# Patient Record
Sex: Female | Born: 1950
Health system: Southern US, Community
[De-identification: ages and names within clinical notes are randomized; demographics above are authoritative.]

## PROBLEM LIST (undated history)

## (undated) DIAGNOSIS — L729 Follicular cyst of the skin and subcutaneous tissue, unspecified: Secondary | ICD-10-CM

## (undated) DIAGNOSIS — E7800 Pure hypercholesterolemia, unspecified: Secondary | ICD-10-CM

## (undated) DIAGNOSIS — C801 Malignant (primary) neoplasm, unspecified: Secondary | ICD-10-CM

## (undated) DIAGNOSIS — I10 Essential (primary) hypertension: Secondary | ICD-10-CM

## (undated) DIAGNOSIS — R011 Cardiac murmur, unspecified: Secondary | ICD-10-CM

## (undated) HISTORY — PX: TUBAL LIGATION: SHX77

## (undated) HISTORY — DX: Malignant (primary) neoplasm, unspecified: C80.1

## (undated) HISTORY — PX: BREAST CYST ASPIRATION: SHX578

## (undated) HISTORY — DX: Cardiac murmur, unspecified: R01.1

## (undated) HISTORY — PX: TONSILLECTOMY AND ADENOIDECTOMY: SHX28

## (undated) HISTORY — PX: BREAST EXCISIONAL BIOPSY: SUR124

## (undated) HISTORY — DX: Follicular cyst of the skin and subcutaneous tissue, unspecified: L72.9

---

## 2004-11-15 ENCOUNTER — Ambulatory Visit: Payer: Self-pay | Admitting: Obstetrics and Gynecology

## 2005-04-12 ENCOUNTER — Ambulatory Visit: Payer: Self-pay | Admitting: Internal Medicine

## 2005-12-17 ENCOUNTER — Ambulatory Visit: Payer: Self-pay | Admitting: Obstetrics and Gynecology

## 2006-09-23 HISTORY — PX: ABDOMINAL HYSTERECTOMY: SHX81

## 2007-01-06 ENCOUNTER — Other Ambulatory Visit: Payer: Self-pay

## 2007-01-06 ENCOUNTER — Ambulatory Visit: Payer: Self-pay

## 2007-08-04 ENCOUNTER — Ambulatory Visit: Payer: Self-pay | Admitting: Gastroenterology

## 2008-01-11 ENCOUNTER — Ambulatory Visit: Payer: Self-pay | Admitting: Obstetrics and Gynecology

## 2008-09-23 LAB — HM COLONOSCOPY

## 2008-10-10 ENCOUNTER — Ambulatory Visit: Payer: Self-pay | Admitting: Gastroenterology

## 2009-01-16 ENCOUNTER — Ambulatory Visit: Payer: Self-pay | Admitting: Obstetrics and Gynecology

## 2010-01-18 ENCOUNTER — Ambulatory Visit: Payer: Self-pay | Admitting: Obstetrics and Gynecology

## 2011-01-21 ENCOUNTER — Ambulatory Visit: Payer: Self-pay | Admitting: Obstetrics and Gynecology

## 2012-01-22 ENCOUNTER — Ambulatory Visit: Payer: Self-pay | Admitting: Obstetrics and Gynecology

## 2012-12-14 LAB — BASIC METABOLIC PANEL
BUN: 21 mg/dL (ref 4–21)
Creatinine: 0.7 mg/dL (ref 0.5–1.1)
GLUCOSE: 94 mg/dL
Potassium: 4.3 mmol/L (ref 3.4–5.3)
SODIUM: 141 mmol/L (ref 137–147)

## 2012-12-14 LAB — LIPID PANEL
Cholesterol: 228 mg/dL — AB (ref 0–200)
HDL: 55 mg/dL (ref 35–70)
LDL CALC: 137 mg/dL
TRIGLYCERIDES: 179 mg/dL — AB (ref 40–160)

## 2013-01-25 ENCOUNTER — Ambulatory Visit: Payer: Self-pay | Admitting: Obstetrics and Gynecology

## 2013-01-25 LAB — HM MAMMOGRAPHY

## 2013-12-16 ENCOUNTER — Other Ambulatory Visit: Payer: Self-pay | Admitting: Obstetrics and Gynecology

## 2013-12-16 DIAGNOSIS — Z1371 Encounter for nonprocreative screening for genetic disease carrier status: Secondary | ICD-10-CM

## 2013-12-27 LAB — CBC AND DIFFERENTIAL: WBC: 4.8 10^3/mL

## 2013-12-28 ENCOUNTER — Ambulatory Visit: Payer: Self-pay

## 2014-01-26 ENCOUNTER — Ambulatory Visit: Payer: Self-pay | Admitting: Obstetrics and Gynecology

## 2014-01-31 ENCOUNTER — Ambulatory Visit
Admission: RE | Admit: 2014-01-31 | Discharge: 2014-01-31 | Disposition: A | Discharge status: Home / Self Care | Payer: Federal, State, Local not specified - PPO | Source: Ambulatory Visit | Attending: Obstetrics and Gynecology | Admitting: Obstetrics and Gynecology

## 2014-01-31 ENCOUNTER — Other Ambulatory Visit: Payer: Self-pay | Admitting: Obstetrics and Gynecology

## 2014-01-31 VITALS — BP 189/82 | HR 85

## 2014-01-31 DIAGNOSIS — T7840XA Allergy, unspecified, initial encounter: Secondary | ICD-10-CM

## 2014-01-31 DIAGNOSIS — Z1371 Encounter for nonprocreative screening for genetic disease carrier status: Secondary | ICD-10-CM

## 2014-01-31 DIAGNOSIS — T1590XA Foreign body on external eye, part unspecified, unspecified eye, initial encounter: Secondary | ICD-10-CM

## 2014-01-31 MED ORDER — DIPHENHYDRAMINE HCL 50 MG/ML IJ SOLN
50.0000 mg | Freq: Once | INTRAMUSCULAR | Status: AC
  Administered 2014-01-31: 50 mg via INTRAVENOUS

## 2014-01-31 MED ORDER — GADOBENATE DIMEGLUMINE 529 MG/ML IV SOLN
15.0000 mL | Freq: Once | INTRAVENOUS | Status: AC | PRN
  Administered 2014-01-31: 15 mL via INTRAVENOUS

## 2014-02-02 ENCOUNTER — Other Ambulatory Visit: Payer: Self-pay | Admitting: Obstetrics and Gynecology

## 2014-02-02 DIAGNOSIS — Z1371 Encounter for nonprocreative screening for genetic disease carrier status: Secondary | ICD-10-CM

## 2014-10-24 ENCOUNTER — Encounter: Payer: Self-pay | Admitting: Nurse Practitioner

## 2014-10-24 ENCOUNTER — Encounter (INDEPENDENT_AMBULATORY_CARE_PROVIDER_SITE_OTHER): Payer: Self-pay

## 2014-10-24 ENCOUNTER — Ambulatory Visit (INDEPENDENT_AMBULATORY_CARE_PROVIDER_SITE_OTHER): Payer: Medicare Other | Admitting: Nurse Practitioner

## 2014-10-24 VITALS — BP 160/90 | HR 71 | Temp 97.8°F | Resp 12 | Ht 65.5 in | Wt 168.8 lb

## 2014-10-24 DIAGNOSIS — Z13220 Encounter for screening for lipoid disorders: Secondary | ICD-10-CM

## 2014-10-24 DIAGNOSIS — Z0000 Encounter for general adult medical examination without abnormal findings: Secondary | ICD-10-CM | POA: Insufficient documentation

## 2014-10-24 DIAGNOSIS — Z131 Encounter for screening for diabetes mellitus: Secondary | ICD-10-CM

## 2014-10-24 NOTE — Progress Notes (Signed)
Pre visit review using our clinic review tool, if applicable. No additional management support is needed unless otherwise documented below in the visit note. 

## 2014-10-24 NOTE — Assessment & Plan Note (Signed)
Welcome to [facility] Visit completed. Appropriate orders for labs were placed (lipid and A1c). No referrals needed today. No medications were needing to be refilled. Pt to return for nurse visit in order to have BP re-checked to see if pt is hypertensive. Initial wellness visit in 1 year.

## 2014-10-24 NOTE — Progress Notes (Signed)
   Subjective:    Patient ID: Deanna Ross, female    DOB: 1950/10/04, 64 y.o.   MRN: [id number]  [place]- 2016, Breast MRI this year no findings Pap Smear- Full Hysterectomy  Colonoscopy- 2010 PSA- N/A Bone Density-  Scheduled for Feb/March Glaucoma- 3 years ago  Hearing - 5 years ago, L ear worse than R Hemoglobin A1C- 3 years ago  Cholesterol- 3 years ago   Social  Alcohol intake-None  Smoking history- Never Smoker Smokers in home- Denies Illicit drug use- Denies Exercise- Water aerobics 3-5 x week 1 hour Diet- Eats out every few days, low fat diet  Sexually Active- Not currently Multiple Partners- Denies  Safety  Patient feesl safe at home- Yes Patient does have smoke detectors at home Yes Patient does wear sunscreen or protective clothing when in direct sunlight Yes Patient does wear seat belt when driving or riding with others. Yes  Activities of Daily Living Patient can do their own household chores. Denies needing assistance with: driving, feeding themselves, getting from bed to chair, getting to the toilet, bathing/showering, dressing, managing money, climbing flight of stairs, or preparing meals.   Depression Screen Patient denies losing interest in daily life, feeling hopeless, or crying easily over simple problems.   Fall Screen Patient denies being afraid of falling or falling in the last year.   Immunizations The following Immunizations are up to date: Influenza, shingles, pneumonia, and tetanus.   Pt has living will   Other Providers-  Dr. Ouida Sills    Review of Systems No ROS this is a Welcome to Medicare visit     Objective:   Physical Exam No physical exam with a Welcome to Medicare visit.   BP 160/90 mmHg  Pulse 71  Temp(Src) 97.8 F (36.6 C) (Oral)  Resp 12  Ht 5' 5.5" (1.664 m)  Wt 168 lb 12.8 oz (76.567 kg)  BMI 27.65 kg/m2  SpO2 98% 1st BP reading, will attain 2nd later at a nurse visit to  determine if true HTN.     Assessment & Plan:  This is a routine wellness examination for this patient.  I reviewed all health maintenance protocols including mammography, colonoscopy, bone density. Needed referrals (if needed) placed. Age and diagnosis appropriate screening labs were ordered.  Her immunization history was reviewed and appropriate vaccinations were ordered.  Her current medications and allergies were reviewed and needed refills of her chronic medications were ordered if needed.  The plan for yearly health maintenance was discussed and a handout was given to the patient.    MEDICARE ATTESTATION I have personally reviewed:  The patient's medical and social history. The use of alcohol, tobacco and illicit drugs. The current medications and supplements. The patient's function ability including ADLs, fall risks, home safety risks, and hearing and visual impairment.   Diet and physical activities. Evaluation for depression and mood disorders.    The patient's weight, height, BMI and visual acuity have been recorded in the chart.  I have made referrals, counseled and provided education to the patient based on review of the above.

## 2014-10-24 NOTE — Patient Instructions (Signed)
Please make your fasting lab appointment and nurse visit for BP check at the [facility] desk.   Health Maintenance Adopting a healthy lifestyle and getting preventive care can go a long way to promote health and wellness. Talk with your health care provider about what schedule of regular examinations is right for you. This is a good chance for you to check in with your provider about disease prevention and staying healthy. In between checkups, there are plenty of things you can do on your own. Experts have done a lot of research about which lifestyle changes and preventive measures are most likely to keep you healthy. Ask your health care provider for more information. WEIGHT AND DIET  Eat a healthy diet  Be sure to include plenty of vegetables, fruits, low-fat dairy products, and lean protein.  Do not eat a lot of foods high in solid fats, added sugars, or salt.  Get regular exercise. This is one of the most important things you can do for your health.  Most adults should exercise for at least 150 minutes each week. The exercise should increase your heart rate and make you sweat (moderate-intensity exercise).  Most adults should also do strengthening exercises at least twice a week. This is in addition to the moderate-intensity exercise.  Maintain a healthy weight  Body mass index (BMI) is a measurement that can be used to identify possible weight problems. It estimates body fat based on height and weight. Your health care provider can help determine your BMI and help you achieve or maintain a healthy weight.  For females 48 years of age and older:   A BMI below 18.5 is considered underweight.  A BMI of 18.5 to 24.9 is normal.  A BMI of 25 to 29.9 is considered overweight.  A BMI of 30 and above is considered obese.  Watch levels of cholesterol and blood lipids  You should start having your blood tested for lipids and cholesterol at 65 years of age, then have this test every 5  years.  You may need to have your cholesterol levels checked more often if:  Your lipid or cholesterol levels are high.  You are older than 65 years of age.  You are at high risk for heart disease.  CANCER SCREENING   Lung Cancer  Lung cancer screening is recommended for adults 68-84 years old who are at high risk for lung cancer because of a history of smoking.  A yearly low-dose CT scan of the lungs is recommended for people who:  Currently smoke.  Have quit within the past 15 years.  Have at least a 30-pack-year history of smoking. A pack year is smoking an average of one pack of cigarettes a day for 1 year.  Yearly screening should continue until it has been 15 years since you quit.  Yearly screening should stop if you develop a health problem that would prevent you from having lung cancer treatment.  Breast Cancer  Practice breast self-awareness. This means understanding how your breasts normally appear and feel.  It also means doing regular breast self-exams. Let your health care provider know about any changes, no matter how small.  If you are in your 20s or 30s, you should have a clinical breast exam (CBE) by a health care provider every 1-3 years as part of a regular health exam.  If you are 30 or older, have a CBE every year. Also consider having a breast X-ray (mammogram) every year.  If you have a  family history of breast cancer, talk to your health care provider about genetic screening.  If you are at high risk for breast cancer, talk to your health care provider about having an MRI and a mammogram every year.  Breast cancer gene (BRCA) assessment is recommended for women who have family members with BRCA-related cancers. BRCA-related cancers include:  Breast.  Ovarian.  Tubal.  Peritoneal cancers.  Results of the assessment will determine the need for genetic counseling and BRCA1 and BRCA2 testing. Cervical Cancer Routine pelvic examinations to  screen for cervical cancer are no longer recommended for nonpregnant women who are considered low risk for cancer of the pelvic organs (ovaries, uterus, and vagina) and who do not have symptoms. A pelvic examination may be necessary if you have symptoms including those associated with pelvic infections. Ask your health care provider if a screening pelvic exam is right for you.   The Pap test is the screening test for cervical cancer for women who are considered at risk.  If you had a hysterectomy for a problem that was not cancer or a condition that could lead to cancer, then you no longer need Pap tests.  If you are older than 65 years, and you have had normal Pap tests for the past 10 years, you no longer need to have Pap tests.  If you have had past treatment for cervical cancer or a condition that could lead to cancer, you need Pap tests and screening for cancer for at least 20 years after your treatment.  If you no longer get a Pap test, assess your risk factors if they change (such as having a new sexual partner). This can affect whether you should start being screened again.  Some women have medical problems that increase their chance of getting cervical cancer. If this is the case for you, your health care provider may recommend more frequent screening and Pap tests.  The human papillomavirus (HPV) test is another test that may be used for cervical cancer screening. The HPV test looks for the virus that can cause cell changes in the cervix. The cells collected during the Pap test can be tested for HPV.  The HPV test can be used to screen women 33 years of age and older. Getting tested for HPV can extend the interval between normal Pap tests from three to five years.  An HPV test also should be used to screen women of any age who have unclear Pap test results.  After 65 years of age, women should have HPV testing as often as Pap tests.  Colorectal Cancer  This type of cancer can be  detected and often prevented.  Routine colorectal cancer screening usually begins at 65 years of age and continues through 65 years of age.  Your health care provider may recommend screening at an earlier age if you have risk factors for colon cancer.  Your health care provider may also recommend using home test kits to check for hidden blood in the stool.  A small camera at the end of a tube can be used to examine your colon directly (sigmoidoscopy or colonoscopy). This is done to check for the earliest forms of colorectal cancer.  Routine screening usually begins at age 61.  Direct examination of the colon should be repeated every 5-10 years through 65 years of age. However, you may need to be screened more often if early forms of precancerous polyps or small growths are found. Skin Cancer  Check your skin  from head to toe regularly.  Tell your health care provider about any new moles or changes in moles, especially if there is a change in a mole's shape or color.  Also tell your health care provider if you have a mole that is larger than the size of a pencil eraser.  Always use sunscreen. Apply sunscreen liberally and repeatedly throughout the day.  Protect yourself by wearing long sleeves, pants, a wide-brimmed hat, and sunglasses whenever you are outside. HEART DISEASE, DIABETES, AND HIGH BLOOD PRESSURE   Have your blood pressure checked at least every 1-2 years. High blood pressure causes heart disease and increases the risk of stroke.  If you are between 87 years and 42 years old, ask your health care provider if you should take aspirin to prevent strokes.  Have regular diabetes screenings. This involves taking a blood sample to check your fasting blood sugar level.  If you are at a normal weight and have a low risk for diabetes, have this test once every three years after 65 years of age.  If you are overweight and have a high risk for diabetes, consider being tested at a  younger age or more often. PREVENTING INFECTION  Hepatitis B  If you have a higher risk for hepatitis B, you should be screened for this virus. You are considered at high risk for hepatitis B if:  You were born in a country where hepatitis B is common. Ask your health care provider which countries are considered high risk.  Your parents were born in a high-risk country, and you have not been immunized against hepatitis B (hepatitis B vaccine).  You have HIV or AIDS.  You use needles to inject street drugs.  You live with someone who has hepatitis B.  You have had sex with someone who has hepatitis B.  You get hemodialysis treatment.  You take certain medicines for conditions, including cancer, organ transplantation, and autoimmune conditions. Hepatitis C  Blood testing is recommended for:  Everyone born from [birth year] through 1965.  Anyone with known risk factors for hepatitis C. Sexually transmitted infections (STIs)  You should be screened for sexually transmitted infections (STIs) including gonorrhea and chlamydia if:  You are sexually active and are younger than 65 years of age.  You are older than 65 years of age and your health care provider tells you that you are at risk for this type of infection.  Your sexual activity has changed since you were last screened and you are at an increased risk for chlamydia or gonorrhea. Ask your health care provider if you are at risk.  If you do not have HIV, but are at risk, it may be recommended that you take a prescription medicine daily to prevent HIV infection. This is called pre-exposure prophylaxis (PrEP). You are considered at risk if:  You are sexually active and do not regularly use condoms or know the HIV status of your partner(s).  You take drugs by injection.  You are sexually active with a partner who has HIV. Talk with your health care provider about whether you are at high risk of being infected with HIV. If you choose  to begin PrEP, you should first be tested for HIV. You should then be tested every 3 months for as long as you are taking PrEP.  PREGNANCY   If you are premenopausal and you may become pregnant, ask your health care provider about preconception counseling.  If you may become pregnant, take 400 to  800 micrograms (mcg) of folic acid every day.  If you want to prevent pregnancy, talk to your health care provider about birth control (contraception). OSTEOPOROSIS AND MENOPAUSE   Osteoporosis is a disease in which the bones lose minerals and strength with aging. This can result in serious bone fractures. Your risk for osteoporosis can be identified using a bone density scan.  If you are 13 years of age or older, or if you are at risk for osteoporosis and fractures, ask your health care provider if you should be screened.  Ask your health care provider whether you should take a calcium or vitamin D supplement to lower your risk for osteoporosis.  Menopause may have certain physical symptoms and risks.  Hormone replacement therapy may reduce some of these symptoms and risks. Talk to your health care provider about whether hormone replacement therapy is right for you.  HOME CARE INSTRUCTIONS   Schedule regular health, dental, and eye exams.  Stay current with your immunizations.   Do not use any tobacco products including cigarettes, chewing tobacco, or electronic cigarettes.  If you are pregnant, do not drink alcohol.  If you are breastfeeding, limit how much and how often you drink alcohol.  Limit alcohol intake to no more than 1 drink per day for nonpregnant women. One drink equals 12 ounces of beer, 5 ounces of wine, or 1 ounces of hard liquor.  Do not use street drugs.  Do not share needles.  Ask your health care provider for help if you need support or information about quitting drugs.  Tell your health care provider if you often feel depressed.  Tell your health care  provider if you have ever been abused or do not feel safe at home. Document Released: 03/25/2011 Document Revised: 01/24/2014 Document Reviewed: 08/11/2013 [facility] Patient Information 2015 [place], [place]. This information is not intended to replace advice given to you by your health care provider. Make sure you discuss any questions you have with your health care provider.

## 2014-10-28 ENCOUNTER — Encounter: Payer: Self-pay | Admitting: *Deleted

## 2014-11-02 ENCOUNTER — Ambulatory Visit: Payer: Medicare Other

## 2014-11-02 ENCOUNTER — Telehealth: Payer: Self-pay

## 2014-11-02 ENCOUNTER — Other Ambulatory Visit: Payer: Self-pay | Admitting: Nurse Practitioner

## 2014-11-02 VITALS — BP 176/82 | HR 74

## 2014-11-02 DIAGNOSIS — I10 Essential (primary) hypertension: Secondary | ICD-10-CM

## 2014-11-02 MED ORDER — AMLODIPINE BESYLATE 5 MG PO TABS: 5.0000 mg | ORAL_TABLET | Freq: Every day | ORAL | Status: DC

## 2014-11-02 NOTE — Telephone Encounter (Signed)
Notified patient.  Patient verbalized understanding. 

## 2014-11-02 NOTE — Progress Notes (Signed)
Patient came in with complaints of high blood pressure.  Blood pressure was taken on the left arm in the sitting position.  Pulse was taken on the left hand.  Blood pressure is 176/82.  Pulse is 74.

## 2014-11-04 ENCOUNTER — Telehealth: Payer: Self-pay | Admitting: Nurse Practitioner

## 2014-11-04 NOTE — Telephone Encounter (Signed)
emmi mailed  °

## 2014-11-08 ENCOUNTER — Other Ambulatory Visit (INDEPENDENT_AMBULATORY_CARE_PROVIDER_SITE_OTHER): Payer: Medicare Other

## 2014-11-08 DIAGNOSIS — Z1371 Encounter for nonprocreative screening for genetic disease carrier status: Secondary | ICD-10-CM

## 2014-11-08 DIAGNOSIS — E785 Hyperlipidemia, unspecified: Secondary | ICD-10-CM

## 2014-11-08 DIAGNOSIS — Z131 Encounter for screening for diabetes mellitus: Secondary | ICD-10-CM

## 2014-11-08 DIAGNOSIS — Z79899 Other long term (current) drug therapy: Secondary | ICD-10-CM

## 2014-11-08 DIAGNOSIS — Z13220 Encounter for screening for lipoid disorders: Secondary | ICD-10-CM

## 2014-11-08 LAB — LIPID PANEL
CHOLESTEROL: 236 mg/dL — AB (ref 0–200)
HDL: 51.8 mg/dL (ref >39.00)
NonHDL: 184.2
TRIGLYCERIDES: 251 mg/dL — AB (ref 0.0–149.0)
Total CHOL/HDL Ratio: 5
VLDL: 50.2 mg/dL — ABNORMAL HIGH (ref 0.0–40.0)

## 2014-11-08 LAB — LDL CHOLESTEROL, DIRECT: LDL DIRECT: 139 mg/dL

## 2014-11-08 LAB — HEMOGLOBIN A1C: Hgb A1c MFr Bld: 5.7 % (ref 4.6–6.5)

## 2014-11-10 ENCOUNTER — Other Ambulatory Visit: Payer: Self-pay | Admitting: Nurse Practitioner

## 2014-11-10 MED ORDER — ATORVASTATIN CALCIUM 20 MG PO TABS: 20.0000 mg | ORAL_TABLET | Freq: Every day | ORAL | Status: DC

## 2014-11-15 ENCOUNTER — Telehealth: Payer: Self-pay | Admitting: Nurse Practitioner

## 2014-11-15 MED ORDER — ATORVASTATIN CALCIUM 20 MG PO TABS: 20.0000 mg | ORAL_TABLET | Freq: Every day | ORAL | Status: DC

## 2014-11-15 MED ORDER — AMLODIPINE BESYLATE 5 MG PO TABS: 5.0000 mg | ORAL_TABLET | Freq: Every day | ORAL | Status: DC

## 2014-11-15 NOTE — Telephone Encounter (Signed)
Called patient to verify if patient would like scripts sent to [facility] since script was sent for 90 day

## 2014-11-15 NOTE — Telephone Encounter (Signed)
Script sent [facility] as requested,

## 2014-11-30 ENCOUNTER — Ambulatory Visit (INDEPENDENT_AMBULATORY_CARE_PROVIDER_SITE_OTHER): Payer: Medicare Other | Admitting: Nurse Practitioner

## 2014-11-30 ENCOUNTER — Encounter: Payer: Self-pay | Admitting: Nurse Practitioner

## 2014-11-30 DIAGNOSIS — Z0000 Encounter for general adult medical examination without abnormal findings: Secondary | ICD-10-CM

## 2014-11-30 DIAGNOSIS — Z23 Encounter for immunization: Secondary | ICD-10-CM

## 2014-11-30 LAB — BASIC METABOLIC PANEL
BUN: 22 mg/dL (ref 6–23)
CO2: 31 meq/L (ref 19–32)
Calcium: 10.3 mg/dL (ref 8.4–10.5)
Chloride: 104 mEq/L (ref 96–112)
Creatinine, Ser: 0.7 mg/dL (ref 0.40–1.20)
GFR: 89.25 mL/min (ref >60.00)
Glucose, Bld: 86 mg/dL (ref 70–99)
Potassium: 4.2 mEq/L (ref 3.5–5.1)
SODIUM: 139 meq/L (ref 135–145)

## 2014-11-30 LAB — CBC WITH DIFFERENTIAL/PLATELET
Basophils Absolute: 0.1 10*3/uL (ref 0.0–0.1)
Basophils Relative: 0.7 % (ref 0.0–3.0)
Eosinophils Absolute: 0.1 10*3/uL (ref 0.0–0.7)
Eosinophils Relative: 0.9 % (ref 0.0–5.0)
HCT: 41.6 % (ref 36.0–46.0)
HEMOGLOBIN: 14 g/dL (ref 12.0–15.0)
Lymphocytes Relative: 35.5 % (ref 12.0–46.0)
Lymphs Abs: 2.7 10*3/uL (ref 0.7–4.0)
MCHC: 33.7 g/dL (ref 30.0–36.0)
MCV: 88.7 fl (ref 78.0–100.0)
MONO ABS: 0.6 10*3/uL (ref 0.1–1.0)
MONOS PCT: 7.4 % (ref 3.0–12.0)
NEUTROS ABS: 4.2 10*3/uL (ref 1.4–7.7)
NEUTROS PCT: 55.5 % (ref 43.0–77.0)
Platelets: 243 10*3/uL (ref 150.0–400.0)
RBC: 4.69 Mil/uL (ref 3.87–5.11)
RDW: 13.2 % (ref 11.5–15.5)
WBC: 7.5 10*3/uL (ref 4.0–10.5)

## 2014-11-30 MED ORDER — ESOMEPRAZOLE MAGNESIUM 20 MG PO PACK: 20.0000 mg | PACK | Freq: Every day | ORAL | Status: DC

## 2014-11-30 NOTE — Progress Notes (Signed)
Subjective:    Patient ID: Deanna Ross, female    DOB: May 04, 1951, 64 y.o.   MRN: [id number]  HPI  Deanna Ross is a 64 yo female here for her annual exam   1) Health Maintenance-   Diet- Goes out more often than eats at home  Exercise- Likes to be in the pool, went twice this week x 1 hr  Immunizations- Needs prevnar  Mammogram- MRI imaging last year, negative   Pap- Dr. Romelle Starcher, Full hysterectomy   Colonoscopy- 7-8 years ago   2) Chronic Problems-   BRCA2 gene   Under control, yearly mammograms   3) Acute Problems-  Worsening heartburn- wants to switch from Zantac to Nexium    Review of Systems  Constitutional: Negative for fever, chills, diaphoresis, fatigue and unexpected weight change.  HENT: Negative for tinnitus and trouble swallowing.   Eyes: Negative for visual disturbance.  Respiratory: Negative for chest tightness, shortness of breath and wheezing.   Cardiovascular: Negative for chest pain, palpitations and leg swelling.  Gastrointestinal: Negative for nausea, vomiting, abdominal pain, diarrhea, constipation and blood in stool.  Endocrine: Negative for polydipsia, polyphagia and polyuria.  Genitourinary: Negative for dysuria, hematuria, vaginal discharge and vaginal pain.  Musculoskeletal: Negative for myalgias, back pain, arthralgias and gait problem.  Skin: Negative for color change and rash.  Neurological: Negative for dizziness, weakness, numbness and headaches.  Hematological: Does not bruise/bleed easily.  Psychiatric/Behavioral: Negative for suicidal ideas and sleep disturbance. The patient is not nervous/anxious.    Past Medical History  Diagnosis Date  . Cancer     Has Breast CA gene  . Heart murmur     History   Social History  . Marital Status: Married    Spouse Name: N/A  . Number of Children: N/A  . Years of Education: N/A   Occupational History  . Not on file.   Social History Main Topics  . Smoking status: Never Smoker   .  Smokeless tobacco: Never Used  . Alcohol Use: No  . Drug Use: No  . Sexual Activity:    Partners: Male   Other Topics Concern  . Not on file   Social History Narrative   Retired 3 years ago from [employer] with husband   3 daughters (33, 54, 54)    57 Grandchildren    Enjoys swimming   2 cats and 1 dog- live inside the house     Past Surgical History  Procedure Laterality Date  . Abdominal hysterectomy  2008  . Tonsillectomy and adenoidectomy    . Tubal ligation      Family History  Problem Relation Age of Onset  . Hyperlipidemia Mother   . Hypertension Mother   . Cancer Daughter     Breast  . Cancer Paternal Aunt     Breast  . Cancer Paternal Uncle     Prostate  . Cancer Maternal Grandmother     Breast    Allergies  Allergen Reactions  . Gadolinium Derivatives Hives and Itching    Itching in throat, hives, headache (on 01/31/14) WILL NEED 13-HOUR PREP IN FUTURE  . Iodine Hives  . Tdap [Diphth-Acell Pertussis-Tetanus] Swelling    Current Outpatient Prescriptions on File Prior to Visit  Medication Sig Dispense Refill  . amLODipine (NORVASC) 5 MG tablet Take 1 tablet (5 mg total) by mouth daily. 90 tablet 1  . atorvastatin (LIPITOR) 20 MG tablet Take 1 tablet (20 mg total) by mouth daily.  90 tablet 1   No current facility-administered medications on file prior to visit.      Objective:   Physical Exam  Constitutional: She is oriented to person, place, and time. She appears well-developed and well-nourished. No distress.  BP 140/80 mmHg  Pulse 73  Temp(Src) 98 F (36.7 C) (Oral)  Resp 14  Ht 5' 5.5" (1.664 m)  Wt 171 lb (77.565 kg)  BMI 28.01 kg/m2  SpO2 98%   HENT:  Head: Normocephalic and atraumatic.  Right Ear: External ear normal.  Left Ear: External ear normal.  Facial twitches  Eyes: Right eye exhibits no discharge. Left eye exhibits no discharge. No scleral icterus.  Neck: Normal range of motion. Neck supple. No thyromegaly present.    Cardiovascular: Normal rate, regular rhythm, normal heart sounds and intact distal pulses.  Exam reveals no gallop and no friction rub.   No murmur heard. Pulmonary/Chest: Effort normal and breath sounds normal. No respiratory distress. She has no wheezes. She has no rales. She exhibits no tenderness.  Normal breast exam  Abdominal: Soft. Bowel sounds are normal. She exhibits no distension and no mass. There is no tenderness. There is no rebound and no guarding.  Musculoskeletal: Normal range of motion. She exhibits no edema or tenderness.  Lymphadenopathy:    She has no cervical adenopathy.  Neurological: She is alert and oriented to person, place, and time. No cranial nerve deficit. She exhibits normal muscle tone. Coordination normal.  Skin: Skin is warm and dry. No rash noted. She is not diaphoretic.  Psychiatric: She has a normal mood and affect. Her behavior is normal. Judgment and thought content normal.      Assessment & Plan:

## 2014-11-30 NOTE — Progress Notes (Signed)
Pre visit review using our clinic review tool, if applicable. No additional management support is needed unless otherwise documented below in the visit note. 

## 2014-11-30 NOTE — Patient Instructions (Addendum)
Follow up in May for BP and Cholesterol check.

## 2014-12-01 ENCOUNTER — Encounter: Payer: Self-pay | Admitting: Nurse Practitioner

## 2014-12-01 DIAGNOSIS — Z0000 Encounter for general adult medical examination without abnormal findings: Secondary | ICD-10-CM | POA: Insufficient documentation

## 2014-12-01 LAB — HIV ANTIBODY (ROUTINE TESTING W REFLEX): HIV: NONREACTIVE

## 2014-12-01 NOTE — Assessment & Plan Note (Addendum)
Discussed acute and chronic issues. Reviewed health maintenance measures, PFSHx, and immunizations. Obtain routine labs CBC w/ diff, and CMET. HIV screening completed. Prevnar given today.

## 2015-01-24 ENCOUNTER — Encounter: Payer: Self-pay | Admitting: Nurse Practitioner

## 2015-01-24 ENCOUNTER — Ambulatory Visit (INDEPENDENT_AMBULATORY_CARE_PROVIDER_SITE_OTHER): Payer: Medicare Other | Admitting: Nurse Practitioner

## 2015-01-24 VITALS — BP 118/78 | HR 74 | Temp 98.1°F | Resp 12 | Ht 65.5 in | Wt 168.1 lb

## 2015-01-24 DIAGNOSIS — Z1501 Genetic susceptibility to malignant neoplasm of breast: Secondary | ICD-10-CM | POA: Diagnosis not present

## 2015-01-24 DIAGNOSIS — Z1509 Genetic susceptibility to other malignant neoplasm: Principal | ICD-10-CM

## 2015-01-24 DIAGNOSIS — I10 Essential (primary) hypertension: Secondary | ICD-10-CM | POA: Diagnosis not present

## 2015-01-24 MED ORDER — AMLODIPINE BESYLATE 10 MG PO TABS
10.0000 mg | ORAL_TABLET | Freq: Every day | ORAL | Status: DC
Start: 2015-01-24 — End: 2015-02-25

## 2015-01-24 NOTE — Assessment & Plan Note (Signed)
Pt tried Amlodipine 5 mg and has good number in clinic today, however readings at home and at the drug stores are too high. Upped to 10 mg amlodipine and re-check in 3 months. Asked her to call if numbers running too high or below 100/60.

## 2015-01-24 NOTE — Assessment & Plan Note (Addendum)
BRCA 2 positive gene. Saw Dr. Romelle Starcher and was seen at [facility]. Pt had BIRADS 1 screen last year with MRI done at [facility] and she reports she can not do this every year due to cost. Referral for diagnostic mammogram placed and [place] for each breast placed, okay per pt.

## 2015-01-24 NOTE — Progress Notes (Signed)
Pre visit review using our clinic review tool, if applicable. No additional management support is needed unless otherwise documented below in the visit note. 

## 2015-01-24 NOTE — Progress Notes (Signed)
   Subjective:    Patient ID: Deanna Ross, female    DOB: 07-30-1951, 64 y.o.   MRN: [id number]  HPI  Deanna Ross is a 64 yo female here for a BP follow up.   1) Amlodipine 5 mg. Compliant  Arm cuff at home  140's-160's over at home  Diet- No change from last time Salt intake- not watching   2) Dexa scan- never was called about Dexa scan and she has completed treatment for osteoporosis last year. Needs mammogram this year.   Review of Systems  Constitutional: Negative for fever, chills, diaphoresis and fatigue.  Respiratory: Negative for chest tightness, shortness of breath and wheezing.   Cardiovascular: Negative for chest pain, palpitations and leg swelling.  Gastrointestinal: Negative for nausea, vomiting and diarrhea.  Skin: Negative for rash.  Neurological: Negative for dizziness, weakness, numbness and headaches.  Psychiatric/Behavioral: The patient is not nervous/anxious.       Objective:   Physical Exam  Constitutional: She is oriented to person, place, and time. She appears well-developed and well-nourished. No distress.  BP 118/78 mmHg  Pulse 74  Temp(Src) 98.1 F (36.7 C) (Oral)  Resp 12  Ht 5' 5.5" (1.664 m)  Wt 168 lb 1.9 oz (76.259 kg)  BMI 27.54 kg/m2  SpO2 97%   HENT:  Head: Normocephalic and atraumatic.  Right Ear: External ear normal.  Left Ear: External ear normal.  Cardiovascular: Normal rate, regular rhythm and normal heart sounds.  Exam reveals no gallop and no friction rub.   No murmur heard. Pulmonary/Chest: Effort normal and breath sounds normal. No respiratory distress. She has no wheezes. She has no rales. She exhibits no tenderness.  Neurological: She is alert and oriented to person, place, and time. No cranial nerve deficit. She exhibits normal muscle tone. Coordination normal.  Skin: Skin is warm and dry. No rash noted. She is not diaphoretic.  Psychiatric: She has a normal mood and affect. Her behavior is normal. Judgment and thought  content normal.      Assessment & Plan:

## 2015-01-24 NOTE — Patient Instructions (Addendum)
We will contact you about your bone density scan and mammogram/Ultrasounds.   Try the amlodipine 10 mg and follow up in 3 months.

## 2015-01-25 ENCOUNTER — Ambulatory Visit: Payer: Medicare Other

## 2015-01-27 ENCOUNTER — Telehealth: Payer: Self-pay | Admitting: Nurse Practitioner

## 2015-01-27 NOTE — Telephone Encounter (Signed)
Patient called with question concerning refill on amlodipine

## 2015-01-28 ENCOUNTER — Other Ambulatory Visit: Payer: Self-pay | Admitting: Nurse Practitioner

## 2015-01-30 ENCOUNTER — Ambulatory Visit: Payer: Medicare Other | Admitting: Nurse Practitioner

## 2015-01-30 NOTE — Telephone Encounter (Signed)
Left message for patient to return my call.

## 2015-01-31 ENCOUNTER — Other Ambulatory Visit: Payer: Self-pay | Admitting: Nurse Practitioner

## 2015-01-31 DIAGNOSIS — Z1231 Encounter for screening mammogram for malignant neoplasm of breast: Secondary | ICD-10-CM

## 2015-02-07 ENCOUNTER — Ambulatory Visit
Admission: RE | Admit: 2015-02-07 | Discharge: 2015-02-07 | Disposition: A | Discharge status: Home / Self Care | Payer: Medicare Other | Source: Ambulatory Visit | Attending: Nurse Practitioner | Admitting: Nurse Practitioner

## 2015-02-07 ENCOUNTER — Other Ambulatory Visit: Payer: Self-pay | Admitting: Nurse Practitioner

## 2015-02-07 DIAGNOSIS — M8580 Other specified disorders of bone density and structure, unspecified site: Secondary | ICD-10-CM | POA: Diagnosis not present

## 2015-02-07 DIAGNOSIS — Z1231 Encounter for screening mammogram for malignant neoplasm of breast: Secondary | ICD-10-CM

## 2015-02-07 DIAGNOSIS — Z0000 Encounter for general adult medical examination without abnormal findings: Secondary | ICD-10-CM

## 2015-02-25 ENCOUNTER — Other Ambulatory Visit: Payer: Self-pay | Admitting: Nurse Practitioner

## 2015-04-26 ENCOUNTER — Encounter: Payer: Self-pay | Admitting: Nurse Practitioner

## 2015-04-26 ENCOUNTER — Ambulatory Visit (INDEPENDENT_AMBULATORY_CARE_PROVIDER_SITE_OTHER): Payer: Medicare Other | Admitting: Nurse Practitioner

## 2015-04-26 ENCOUNTER — Encounter (INDEPENDENT_AMBULATORY_CARE_PROVIDER_SITE_OTHER): Payer: Self-pay

## 2015-04-26 VITALS — BP 142/78 | HR 68 | Temp 97.9°F | Resp 14 | Ht 66.0 in | Wt 169.6 lb

## 2015-04-26 DIAGNOSIS — I10 Essential (primary) hypertension: Secondary | ICD-10-CM | POA: Diagnosis not present

## 2015-04-26 MED ORDER — LISINOPRIL 10 MG PO TABS: 10.0000 mg | ORAL_TABLET | Freq: Every day | ORAL | Status: DC

## 2015-04-26 MED ORDER — ATORVASTATIN CALCIUM 20 MG PO TABS: 20.0000 mg | ORAL_TABLET | Freq: Every day | ORAL | Status: DC

## 2015-04-26 NOTE — Patient Instructions (Addendum)
Watch salt and fried foods.   Follow up in 1 month. Take your medications in the morning and see if that helps.

## 2015-04-26 NOTE — Assessment & Plan Note (Addendum)
Pt still not stable on Norvasc on 10 mg. Asked her to continue and move to either evening or morning time. Uncontrolled. Will add lisinopril 10 mg tablet daily. Asked pt to watch fried and salty foods. FU in 1 month.  BP Readings from Last 3 Encounters:  04/26/15 142/78  01/24/15 118/78  11/30/14 140/80   Lab Results  Component Value Date   CREATININE 0.70 11/30/2014

## 2015-04-26 NOTE — Progress Notes (Signed)
   Subjective:    Patient ID: Deanna Ross, female    DOB: 1951/04/06, 64 y.o.   MRN: [id number]  HPI  [name] is a 64 yo female with a follow up of HTN.   1) Not much of a difference in her BP from 5 mg to 10 mg. She reports the lowest her systolic was 831 and her highest 160. She takes her Amlodipine in the afternoon.   Nocturia- 4 x at night, stops fluids at 5 pm. Pt taking in afternoon- asked her to switch to morning.   Review of Systems  Constitutional: Negative for fever, chills, diaphoresis and fatigue.  Respiratory: Negative for chest tightness, shortness of breath and wheezing.   Cardiovascular: Negative for chest pain, palpitations and leg swelling.  Gastrointestinal: Negative for nausea, vomiting and diarrhea.  Skin: Negative for rash.  Neurological: Negative for dizziness, weakness, numbness and headaches.  Psychiatric/Behavioral: The patient is not nervous/anxious.       Objective:   Physical Exam  Constitutional: She is oriented to person, place, and time. She appears well-developed and well-nourished. No distress.  BP 142/78 mmHg  Pulse 68  Temp(Src) 97.9 F (36.6 C)  Resp 14  Ht 5\' 6"  (1.676 m)  Wt 169 lb 9.6 oz (76.93 kg)  BMI 27.39 kg/m2  SpO2 98%   HENT:  Head: Normocephalic and atraumatic.  Right Ear: External ear normal.  Left Ear: External ear normal.  Cardiovascular: Normal rate, regular rhythm and normal heart sounds.   Pulmonary/Chest: Effort normal and breath sounds normal. No respiratory distress. She has no wheezes. She has no rales. She exhibits no tenderness.  Neurological: She is alert and oriented to person, place, and time. No cranial nerve deficit. She exhibits normal muscle tone. Coordination normal.  Skin: Skin is warm and dry. No rash noted. She is not diaphoretic.  Psychiatric: She has a normal mood and affect. Her behavior is normal. Judgment and thought content normal.      Assessment & Plan:

## 2015-04-26 NOTE — Progress Notes (Signed)
Pre visit review using our clinic review tool, if applicable. No additional management support is needed unless otherwise documented below in the visit note. 

## 2015-05-25 ENCOUNTER — Ambulatory Visit (INDEPENDENT_AMBULATORY_CARE_PROVIDER_SITE_OTHER): Payer: Medicare Other | Admitting: Nurse Practitioner

## 2015-05-25 ENCOUNTER — Encounter: Payer: Self-pay | Admitting: Nurse Practitioner

## 2015-05-25 VITALS — BP 138/80 | HR 65 | Temp 98.2°F | Resp 16 | Ht 66.0 in | Wt 169.4 lb

## 2015-05-25 DIAGNOSIS — I10 Essential (primary) hypertension: Secondary | ICD-10-CM | POA: Diagnosis not present

## 2015-05-25 NOTE — Progress Notes (Signed)
Patient ID: Deanna Ross, female    DOB: 12-Jul-1951  Age: 64 y.o. MRN: [id number]  CC: Follow-up   HPI Deanna Ross presents for follow up on HTN.   1) BP is doing better. No complaints on current medication.  137/82 the other morning. Patient reports highest systolic was 224 and does not remember the diastolic number. Patient still spending a lot of time caring for Deanna Ross parents and grandson.  BP Readings from Last 3 Encounters:  05/25/15 138/80  04/26/15 142/78  01/24/15 118/78   History Deanna Ross has a past medical history of Heart murmur and Cancer.   Deanna Ross has past surgical history that includes Abdominal hysterectomy (2008); Tonsillectomy and adenoidectomy; Tubal ligation; Breast cyst aspiration (Left); and Breast biopsy (Left).   Deanna Ross family history includes Breast cancer (age of onset: 43) in Deanna Ross daughter and maternal grandmother; Breast cancer (age of onset: 67) in Deanna Ross sister; Cancer in Deanna Ross daughter, maternal grandmother, paternal aunt, and paternal uncle; Hyperlipidemia in Deanna Ross mother; Hypertension in Deanna Ross mother.Deanna Ross reports that Deanna Ross has never smoked. Deanna Ross has never used smokeless tobacco. Deanna Ross reports that Deanna Ross does not drink alcohol or use illicit drugs.  Outpatient Prescriptions Prior to Visit  Medication Sig Dispense Refill  . amLODipine (NORVASC) 10 MG tablet TAKE 1 TABLET BY MOUTH EVERY DAY 30 tablet 5  . atorvastatin (LIPITOR) 20 MG tablet Take 1 tablet (20 mg total) by mouth daily. 30 tablet 1  . lisinopril (PRINIVIL,ZESTRIL) 10 MG tablet Take 1 tablet (10 mg total) by mouth daily. 30 tablet 1   No facility-administered medications prior to visit.    ROS Review of Systems  Constitutional: Negative for fever, chills, diaphoresis and fatigue.  Respiratory: Negative for chest tightness, shortness of breath and wheezing.   Cardiovascular: Negative for chest pain, palpitations and leg swelling.  Gastrointestinal: Negative for nausea, vomiting and diarrhea.    Objective:   BP 138/80 mmHg  Pulse 65  Temp(Src) 98.2 F (36.8 C)  Resp 16  Ht 5\' 6"  (1.676 m)  Wt 169 lb 6.4 oz (76.839 kg)  BMI 27.35 kg/m2  SpO2 97%  Physical Exam  Constitutional: Deanna Ross is oriented to person, place, and time. Deanna Ross appears well-developed and well-nourished. No distress.  HENT:  Head: Normocephalic and atraumatic.  Right Ear: External ear normal.  Left Ear: External ear normal.  Eyes: Right eye exhibits no discharge. Left eye exhibits no discharge. No scleral icterus.  Glasses  Cardiovascular: Normal rate.   Neurological: Deanna Ross is alert and oriented to person, place, and time. No cranial nerve deficit. Deanna Ross exhibits normal muscle tone. Coordination normal.  Skin: Skin is warm and dry. No rash noted. Deanna Ross is not diaphoretic.  Psychiatric: Deanna Ross has a normal mood and affect. Deanna Ross behavior is normal. Judgment and thought content normal.   Assessment & Plan:   Deanna Ross was seen today for follow-up.  Diagnoses and all orders for this visit:  Essential hypertension  I am having Deanna Ross maintain Deanna Ross amLODipine, lisinopril, and atorvastatin.  No orders of the defined types were placed in this encounter.     Follow-up: Return in about 6 months (around 11/22/2015).

## 2015-05-25 NOTE — Assessment & Plan Note (Signed)
BP Readings from Last 3 Encounters:  05/25/15 138/80  04/26/15 142/78  01/24/15 118/78   Patient is stable on current medication regimen will continue Norvasc and lisinopril. Patient is watching fried salty foods, but still has life stressors. Patient is checking at home and reports she is not spiking as previously to taking the lisinopril. Follow-up in 6 months.

## 2015-05-25 NOTE — Patient Instructions (Signed)
See you in 6 months!   Call us when you need 90 day supply.

## 2015-05-25 NOTE — Progress Notes (Signed)
Pre visit review using our clinic review tool, if applicable. No additional management support is needed unless otherwise documented below in the visit note. 

## 2015-06-18 ENCOUNTER — Other Ambulatory Visit: Payer: Self-pay | Admitting: Nurse Practitioner

## 2015-06-20 ENCOUNTER — Other Ambulatory Visit: Payer: Self-pay | Admitting: Nurse Practitioner

## 2015-06-20 NOTE — Telephone Encounter (Signed)
Medication was refilled this week. Is it okay to refuse?

## 2015-07-06 ENCOUNTER — Other Ambulatory Visit: Payer: Self-pay | Admitting: Nurse Practitioner

## 2015-09-04 ENCOUNTER — Other Ambulatory Visit: Payer: Self-pay | Admitting: Nurse Practitioner

## 2015-11-22 ENCOUNTER — Ambulatory Visit: Payer: Medicare Other | Admitting: Nurse Practitioner

## 2015-11-26 ENCOUNTER — Other Ambulatory Visit: Payer: Self-pay | Admitting: Nurse Practitioner

## 2015-11-27 ENCOUNTER — Other Ambulatory Visit: Payer: Self-pay | Admitting: Nurse Practitioner

## 2015-11-27 NOTE — Telephone Encounter (Signed)
Refilled in October. Patient hasn't been seen recently and does not have recent labs. Please advise?

## 2015-11-30 ENCOUNTER — Encounter: Payer: Self-pay | Admitting: Nurse Practitioner

## 2015-11-30 ENCOUNTER — Ambulatory Visit (INDEPENDENT_AMBULATORY_CARE_PROVIDER_SITE_OTHER): Payer: Medicare Other | Admitting: Nurse Practitioner

## 2015-11-30 VITALS — BP 132/70 | HR 55 | Temp 97.9°F | Resp 14 | Ht 66.0 in | Wt 166.8 lb

## 2015-11-30 DIAGNOSIS — E785 Hyperlipidemia, unspecified: Secondary | ICD-10-CM | POA: Diagnosis not present

## 2015-11-30 DIAGNOSIS — I10 Essential (primary) hypertension: Secondary | ICD-10-CM | POA: Diagnosis not present

## 2015-11-30 DIAGNOSIS — M25562 Pain in left knee: Secondary | ICD-10-CM

## 2015-11-30 LAB — COMPREHENSIVE METABOLIC PANEL
ALBUMIN: 4.4 g/dL (ref 3.5–5.2)
ALK PHOS: 57 U/L (ref 39–117)
ALT: 20 U/L (ref 0–35)
AST: 18 U/L (ref 0–37)
BUN: 16 mg/dL (ref 6–23)
CALCIUM: 9.8 mg/dL (ref 8.4–10.5)
CO2: 28 mEq/L (ref 19–32)
CREATININE: 0.75 mg/dL (ref 0.40–1.20)
Chloride: 107 mEq/L (ref 96–112)
GFR: 82.17 mL/min (ref >60.00)
Glucose, Bld: 102 mg/dL — ABNORMAL HIGH (ref 70–99)
Potassium: 4.2 mEq/L (ref 3.5–5.1)
Sodium: 143 mEq/L (ref 135–145)
TOTAL PROTEIN: 7.1 g/dL (ref 6.0–8.3)
Total Bilirubin: 0.5 mg/dL (ref 0.2–1.2)

## 2015-11-30 LAB — CBC WITH DIFFERENTIAL/PLATELET
BASOS ABS: 0 10*3/uL (ref 0.0–0.1)
BASOS PCT: 0.6 % (ref 0.0–3.0)
EOS ABS: 0.1 10*3/uL (ref 0.0–0.7)
Eosinophils Relative: 0.8 % (ref 0.0–5.0)
HEMATOCRIT: 40 % (ref 36.0–46.0)
HEMOGLOBIN: 13.5 g/dL (ref 12.0–15.0)
LYMPHS PCT: 28.7 % (ref 12.0–46.0)
Lymphs Abs: 2.2 10*3/uL (ref 0.7–4.0)
MCHC: 33.8 g/dL (ref 30.0–36.0)
MCV: 88.9 fl (ref 78.0–100.0)
Monocytes Absolute: 0.5 10*3/uL (ref 0.1–1.0)
Monocytes Relative: 6.2 % (ref 3.0–12.0)
Neutro Abs: 4.8 10*3/uL (ref 1.4–7.7)
Neutrophils Relative %: 63.7 % (ref 43.0–77.0)
Platelets: 244 10*3/uL (ref 150.0–400.0)
RBC: 4.49 Mil/uL (ref 3.87–5.11)
RDW: 13.3 % (ref 11.5–15.5)
WBC: 7.6 10*3/uL (ref 4.0–10.5)

## 2015-11-30 LAB — LDL CHOLESTEROL, DIRECT: LDL DIRECT: 75 mg/dL

## 2015-11-30 MED ORDER — ATORVASTATIN CALCIUM 20 MG PO TABS: 20.0000 mg | ORAL_TABLET | Freq: Every day | ORAL | Status: DC

## 2015-11-30 MED ORDER — LISINOPRIL 10 MG PO TABS: ORAL_TABLET | ORAL | Status: DC

## 2015-11-30 NOTE — Progress Notes (Signed)
Patient ID: Deanna Ross, female    DOB: 03-07-1951  Age: 65 y.o. MRN: [id number]  CC: Hypertension   HPI Deanna Ross presents for 6 month follow up of HTN and chief complaint of left knee pain x 1 month.   1)  Hypertension-  Denies shortness of breath, chest tightness/pain, palpitations Patient is compliant with medication per self report Patient does not check at home BPs  2) Left knee lateral area pain with walking big strides  Knee brace- soft  Aches for awhile after beginning to rest  Small strides helpful   Ibuprofen helpful   History Deanna Ross has a past medical history of Heart murmur and Cancer ([place]).   She has past surgical history that includes Abdominal hysterectomy (2008); Tonsillectomy and adenoidectomy; Tubal ligation; Breast cyst aspiration (Left); and Breast biopsy (Left).   Her family history includes Breast cancer (age of onset: 52) in her daughter and maternal grandmother; Breast cancer (age of onset: 42) in her sister; Cancer in her daughter, maternal grandmother, paternal aunt, and paternal uncle; Hyperlipidemia in her mother; Hypertension in her mother.She reports that she has never smoked. She has never used smokeless tobacco. She reports that she does not drink alcohol or use illicit drugs.  Outpatient Prescriptions Prior to Visit  Medication Sig Dispense Refill  . amLODipine (NORVASC) 10 MG tablet TAKE 1 TABLET BY MOUTH EVERY DAY 90 tablet 3  . atorvastatin (LIPITOR) 20 MG tablet TAKE 1 TABLET BY MOUTH DAILY 30 tablet 0  . lisinopril (PRINIVIL,ZESTRIL) 10 MG tablet TAKE 1 TABLET(10 MG) BY MOUTH DAILY 90 tablet 1   No facility-administered medications prior to visit.    ROS Review of Systems  Constitutional: Negative for fever, chills, diaphoresis and fatigue.  Respiratory: Negative for chest tightness, shortness of breath and wheezing.   Cardiovascular: Negative for chest pain, palpitations and leg swelling.  Gastrointestinal: Negative for  nausea, vomiting and diarrhea.  Musculoskeletal: Positive for arthralgias.       Left knee  Skin: Negative for rash.  Neurological: Negative for dizziness, numbness and headaches.    Objective:  BP 132/70 mmHg  Pulse 55  Temp(Src) 97.9 F (36.6 C) (Oral)  Resp 14  Ht _0  (1.676 m)  Wt 166 lb 12.8 oz (75.66 kg)  BMI 26.94 kg/m2  SpO2 96%  Physical Exam  Constitutional: She is oriented to person, place, and time. She appears well-developed and well-nourished. No distress.  HENT:  Head: Normocephalic and atraumatic.  Right Ear: External ear normal.  Left Ear: External ear normal.  Cardiovascular: Normal rate and regular rhythm.   Murmur heard. Pulmonary/Chest: Effort normal and breath sounds normal. No respiratory distress. She has no wheezes. She has no rales. She exhibits no tenderness.  Musculoskeletal: Normal range of motion. She exhibits no edema or tenderness.  Left knee shows no obvious deformity, no sign of infection, normal ROM, no tenderness to palpation  Neurological: She is alert and oriented to person, place, and time. She exhibits normal muscle tone. Coordination normal.  Skin: Skin is warm and dry. No rash noted. She is not diaphoretic.  Psychiatric: She has a normal mood and affect. Her behavior is normal. Judgment and thought content normal.   Assessment & Plan:   Deanna Ross was seen today for hypertension.  Diagnoses and all orders for this visit:  Essential hypertension -     Cancel: Basic Metabolic Panel (BMET) -     Direct LDL -     CBC w/Diff -  Comp Met (CMET)  Hyperlipidemia -     Cancel: Basic Metabolic Panel (BMET) -     Direct LDL -     CBC w/Diff -     Comp Met (CMET)  Left knee pain  Other orders -     lisinopril (PRINIVIL,ZESTRIL) 10 MG tablet; TAKE 1 TABLET(10 MG) BY MOUTH DAILY -     atorvastatin (LIPITOR) 20 MG tablet; Take 1 tablet (20 mg total) by mouth daily.   I have changed [name]'s atorvastatin. I am also having her  maintain her amLODipine and lisinopril.  Meds ordered this encounter  Medications  . lisinopril (PRINIVIL,ZESTRIL) 10 MG tablet    Sig: TAKE 1 TABLET(10 MG) BY MOUTH DAILY    Dispense:  90 tablet    Refill:  1    **Patient requests 90 days supply**    Order Specific Question:  Supervising Provider    Answer:  Deborra Medina L [[id number]]  . atorvastatin (LIPITOR) 20 MG tablet    Sig: Take 1 tablet (20 mg total) by mouth daily.    Dispense:  90 tablet    Refill:  1    Order Specific Question:  Supervising Provider    Answer:  Crecencio Mc [[id number]]     Follow-up: Return in about 6 months (around 06/01/2016) for Follow up .

## 2015-11-30 NOTE — Patient Instructions (Signed)
Good to see you!   See you in 6 months

## 2015-12-09 DIAGNOSIS — M25562 Pain in left knee: Secondary | ICD-10-CM | POA: Insufficient documentation

## 2015-12-09 NOTE — Assessment & Plan Note (Signed)
Stable currently We'll obtain direct LDL, CMET, and CBC Refill sent for Lipitor 20 mg to pharmacy Advised healthy diet and exercise Follow-up in 6 months

## 2015-12-09 NOTE — Assessment & Plan Note (Signed)
New problem Advised patient to take shorter strides Supportive care such as ice, NSAIDs Patient is not wanting to move forward with any other medications or diagnostics at this time.

## 2015-12-09 NOTE — Assessment & Plan Note (Signed)
We'll obtain C met CBC and direct LDL today Stable on current medication will continue this, refills are sent to pharmacy Follow-up in 6 months  BP Readings from Last 3 Encounters:  11/30/15 132/70  05/25/15 138/80  04/26/15 142/78

## 2015-12-14 ENCOUNTER — Other Ambulatory Visit: Payer: Self-pay | Admitting: Nurse Practitioner

## 2016-01-22 ENCOUNTER — Ambulatory Visit (INDEPENDENT_AMBULATORY_CARE_PROVIDER_SITE_OTHER): Payer: Medicare Other | Admitting: Nurse Practitioner

## 2016-01-22 ENCOUNTER — Encounter: Payer: Self-pay | Admitting: Nurse Practitioner

## 2016-01-22 VITALS — BP 136/88 | HR 72 | Temp 97.7°F | Ht 66.0 in | Wt 169.4 lb

## 2016-01-22 DIAGNOSIS — R21 Rash and other nonspecific skin eruption: Secondary | ICD-10-CM | POA: Insufficient documentation

## 2016-01-22 MED ORDER — METHYLPREDNISOLONE ACETATE 80 MG/ML IJ SUSP
80.0000 mg | Freq: Once | INTRAMUSCULAR | Status: AC
  Administered 2016-01-22: 80 mg via INTRAMUSCULAR

## 2016-01-22 MED ORDER — PREDNISONE 10 MG PO TABS: ORAL_TABLET | ORAL | Status: DC

## 2016-01-22 NOTE — Patient Instructions (Addendum)
50 mg (5 tablets) daily x 3 days, then 40 mg (4 tablets) daily x 3 days, then 30 mg (3 tablets) daily x 3 days, then 20 mg (2 tablets) daily x 3 days, then 10 mg (1 tablet) daily x 3 days.  This will be 15 days and don't take NSAIDs with this medication (advil, aleve, ibuprofen). Benadryl is fine.   This was sent to your pharmacy.

## 2016-01-22 NOTE — Progress Notes (Signed)
Patient ID: Deanna Ross, female    DOB: 03-05-1951  Age: 65 y.o. MRN: [id number]  CC: Rash   HPI Deanna Ross presents for CC of rash x 4 days.   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   Treatment to date:  Ivarest not helpful Benadryl- stops itching  Denies concerns with SOB, Coughing, trouble with vision (cheek is highest location)  History Deanna Ross has a past medical history of Heart murmur and Cancer ([place]).   She has past surgical history that includes Abdominal hysterectomy (2008); Tonsillectomy and adenoidectomy; Tubal ligation; Breast cyst aspiration (Left); and Breast biopsy (Left).   Her family history includes Breast cancer (age of onset: 100) in her daughter and maternal grandmother; Breast cancer (age of onset: 93) in her sister; Cancer in her daughter, maternal grandmother, paternal aunt, and paternal uncle; Hyperlipidemia in her mother; Hypertension in her mother.She reports that she has never smoked. She has never used smokeless tobacco. She reports that she does not drink alcohol or use illicit drugs.  Outpatient Prescriptions Prior to Visit  Medication Sig Dispense Refill  . amLODipine (NORVASC) 10 MG tablet TAKE 1 TABLET BY MOUTH EVERY DAY 90 tablet 3  . atorvastatin (LIPITOR) 20 MG tablet Take 1 tablet (20 mg total) by mouth daily. 90 tablet 1  . lisinopril (PRINIVIL,ZESTRIL) 10 MG tablet TAKE 1 TABLET(10 MG) BY MOUTH DAILY 90 tablet 1  . lisinopril (PRINIVIL,ZESTRIL) 10 MG tablet TAKE 1 TABLET(10 MG) BY MOUTH DAILY 90 tablet 0   No facility-administered medications prior to visit.    ROS Review of Systems  Constitutional: Negative for fever, chills, diaphoresis and fatigue.  Eyes: Negative for visual disturbance.  Respiratory: Negative for cough.   Cardiovascular: Negative for chest pain, palpitations and leg  swelling.  Skin: Positive for rash.    Objective:  BP 136/88 mmHg  Pulse 72  Temp(Src) 97.7 F (36.5 C) (Oral)  Ht 5\' 6"  (1.676 m)  Wt 169 lb 6.4 oz (76.839 kg)  BMI 27.35 kg/m2  SpO2 97%  Physical Exam  Constitutional: She is oriented to person, place, and time. She appears well-developed and well-nourished. No distress.  HENT:  Head: Normocephalic and atraumatic.  Right Ear: External ear normal.  Left Ear: External ear normal.  Cardiovascular: Normal rate and regular rhythm.   Pulmonary/Chest: Effort normal and breath sounds normal. No respiratory distress. She has no wheezes. She has no rales. She exhibits no tenderness.  Neurological: She is alert and oriented to person, place, and time.  Skin: Skin is warm and dry. Rash noted. She is not diaphoretic.  Vesicules and bullae are present in streaks on arms and small erythematous papules on neck, face, and right thigh  Psychiatric: She has a normal mood and affect. Her behavior is normal. Judgment and thought content normal.   Assessment & Plan:   Deanna Ross was seen today for rash.  Diagnoses and all orders for this visit:  Rash and nonspecific skin eruption -     methylPREDNISolone acetate (DEPO-MEDROL) injection 80 mg; Inject 1 mL (80 mg total) into the muscle once.  Other orders -     predniSONE (DELTASONE) 10 MG tablet; Take 5 tablets PO x 3 days, 4 tablets PO x 3 days, 3 tablets PO x 3 days, 2 ts PO x 3 days, 1 t PO x 3 days   I  am having Deanna Ross start on predniSONE. I am also having her maintain her amLODipine, lisinopril, and atorvastatin. We administered methylPREDNISolone acetate.  Meds ordered this encounter  Medications  . predniSONE (DELTASONE) 10 MG tablet    Sig: Take 5 tablets PO x 3 days, 4 tablets PO x 3 days, 3 tablets PO x 3 days, 2 ts PO x 3 days, 1 t PO x 3 days    Dispense:  45 tablet    Refill:  0    Order Specific Question:  Supervising Provider    Answer:  Deborra Medina L [[id number]]  .  methylPREDNISolone acetate (DEPO-MEDROL) injection 80 mg    Sig:      Follow-up: Return if symptoms worsen or fail to improve.

## 2016-01-22 NOTE — Assessment & Plan Note (Addendum)
New onset Depo-80 given IM- tolerated well Face involved  14 day taper of prednisone given Instructions on AVS FU prn worsening/failure to improve.

## 2016-01-22 NOTE — Progress Notes (Signed)
Pre visit review using our clinic review tool, if applicable. No additional management support is needed unless otherwise documented below in the visit note. 

## 2016-06-06 ENCOUNTER — Encounter: Payer: Self-pay | Admitting: Family Medicine

## 2016-06-06 ENCOUNTER — Ambulatory Visit (INDEPENDENT_AMBULATORY_CARE_PROVIDER_SITE_OTHER): Payer: Medicare Other | Admitting: Family Medicine

## 2016-06-06 ENCOUNTER — Ambulatory Visit: Payer: Medicare Other | Admitting: Nurse Practitioner

## 2016-06-06 ENCOUNTER — Other Ambulatory Visit: Payer: Self-pay | Admitting: Family Medicine

## 2016-06-06 VITALS — BP 112/66 | HR 73 | Temp 98.2°F | Wt 170.6 lb

## 2016-06-06 DIAGNOSIS — K219 Gastro-esophageal reflux disease without esophagitis: Secondary | ICD-10-CM | POA: Insufficient documentation

## 2016-06-06 DIAGNOSIS — Z1239 Encounter for other screening for malignant neoplasm of breast: Secondary | ICD-10-CM

## 2016-06-06 DIAGNOSIS — I10 Essential (primary) hypertension: Secondary | ICD-10-CM | POA: Diagnosis not present

## 2016-06-06 DIAGNOSIS — M810 Age-related osteoporosis without current pathological fracture: Secondary | ICD-10-CM

## 2016-06-06 DIAGNOSIS — Z1509 Genetic susceptibility to other malignant neoplasm: Secondary | ICD-10-CM

## 2016-06-06 DIAGNOSIS — M8580 Other specified disorders of bone density and structure, unspecified site: Secondary | ICD-10-CM | POA: Insufficient documentation

## 2016-06-06 DIAGNOSIS — L570 Actinic keratosis: Secondary | ICD-10-CM

## 2016-06-06 DIAGNOSIS — E785 Hyperlipidemia, unspecified: Secondary | ICD-10-CM

## 2016-06-06 DIAGNOSIS — Z23 Encounter for immunization: Secondary | ICD-10-CM | POA: Diagnosis not present

## 2016-06-06 DIAGNOSIS — Z1501 Genetic susceptibility to malignant neoplasm of breast: Secondary | ICD-10-CM

## 2016-06-06 MED ORDER — ESOMEPRAZOLE MAGNESIUM 40 MG PO CPDR: 40.0000 mg | DELAYED_RELEASE_CAPSULE | Freq: Every day | ORAL | 0 refills | Status: DC

## 2016-06-06 NOTE — Assessment & Plan Note (Signed)
Lipid panel ordered.

## 2016-06-06 NOTE — Assessment & Plan Note (Signed)
Patient with fairly significant reflux. Advised to take Nexium daily for a month. If has persistent symptoms at that time would refer to GI. Reflux is contributing to her scratchy throat in addition to exposure to her sister's house.

## 2016-06-06 NOTE — Progress Notes (Signed)
[name], MD Phone: [phone]  Deanna Ross is a 65 y.o. female who presents today for f/u.  HYPERTENSION  Disease Monitoring  Home BP Monitoring 120s/70s Chest pain- no    Dyspnea- no Medications  Compliance-  Taking lisinopril, amlodipine.  Edema- no  GERD: Patient notes she has reflux all the time. Anything she eats can give her reflux. No abdominal pain or blood in her stool. Does note burning and sour taste. Occasional scratchy throat with this. Started taking Nexium and this is beneficial although does not take it every day. Also notes scratchy throat started after being in her sister's house. Notes her sister doesn't take good care of her house and has feces and urine from her pets all over the place. Patient notes no upper respiratory symptoms with this.  Osteoporosis: Previously on treatment for this. Has been off of it 2-3 years. No calcium or vitamin D supplementation. Needs a DEXA scan.  Patient also notes a rough erythematous spot on her left nasal bridge. Wonders if she needs to see dermatology for this.  Is BRCA2 positive. Needs a mammogram.  PMH: nonsmoker   ROS see history of present illness  Objective  Physical Exam Vitals:   06/06/16 0914  BP: 112/66  Pulse: 73  Temp: 98.2 F (36.8 C)    BP Readings from Last 3 Encounters:  06/06/16 112/66  01/22/16 136/88  11/30/15 132/70   Wt Readings from Last 3 Encounters:  06/06/16 170 lb 9.6 oz (77.4 kg)  01/22/16 169 lb 6.4 oz (76.8 kg)  11/30/15 166 lb 12.8 oz (75.7 kg)    Physical Exam  Constitutional: No distress.  HENT:  Head: Normocephalic and atraumatic.    Mouth/Throat: Oropharynx is clear and moist. No oropharyngeal exudate.  Cardiovascular: Normal rate, regular rhythm and normal heart sounds.   Pulmonary/Chest: Effort normal and breath sounds normal.  Abdominal: Soft. Bowel sounds are normal.  Neurological: She is alert. Gait normal.  Skin: Skin is warm and dry. She is not  diaphoretic.     Assessment/Plan: Please see individual problem list.  HTN (hypertension) At goal. Continue current medications.  BRCA2 positive Mammogram ordered.  Hyperlipidemia Lipid panel ordered.  Osteoporosis DEXA scan ordered.  Actinic keratosis Referred to dermatology for evaluation.  GERD (gastroesophageal reflux disease) Patient with fairly significant reflux. Advised to take Nexium daily for a month. If has persistent symptoms at that time would refer to GI. Reflux is contributing to her scratchy throat in addition to exposure to her sister's house.   Orders Placed This Encounter  Procedures  . MM Digital Screening    Standing Status:   Future    Standing Expiration Date:   08/06/2017    Order Specific Question:   Reason for Exam (SYMPTOM  OR DIAGNOSIS REQUIRED)    Answer:   breast cancer screening, history of BRCA 2    Order Specific Question:   Preferred imaging location?    Answer:   [facility]  . DG Bone Density    Standing Status:   Future    Standing Expiration Date:   08/06/2017    Order Specific Question:   Reason for Exam (SYMPTOM  OR DIAGNOSIS REQUIRED)    Answer:   osteoporosis    Order Specific Question:   Preferred imaging location?    Answer:   [facility]  . Flu vaccine HIGH DOSE PF  . Comp Met (CMET)    Standing Status:   Future    Standing Expiration Date:  06/06/2017  . Lipid Profile    Standing Status:   Future    Standing Expiration Date:   06/06/2017  . HgB A1c    Standing Status:   Future    Standing Expiration Date:   06/06/2017  . Ambulatory referral to Dermatology    Referral Priority:   Routine    Referral Type:   Consultation    Referral Reason:   Specialty Services Required    Requested Specialty:   Dermatology    Number of Visits Requested:   1    Meds ordered this encounter  Medications  . esomeprazole (NEXIUM) 40 MG capsule    Sig: Take 1 capsule (40 mg total) by mouth daily.    Dispense:  30 capsule      Refill:  0   [name], MD [facility]

## 2016-06-06 NOTE — Patient Instructions (Signed)
Nice to meet you. Please continue your current medications. We will have you start Nexium daily for 30 days. If your reflux symptoms are not improved at that time please let us know so we can refer you to GI. We'll obtain a mammogram and DEXA scan as well. You will return sometime in the next 1-2 weeks for fasting lab work.

## 2016-06-06 NOTE — Assessment & Plan Note (Signed)
At goal. Continue current medications. 

## 2016-06-06 NOTE — Assessment & Plan Note (Signed)
Mammogram ordered

## 2016-06-06 NOTE — Progress Notes (Signed)
Pre visit review using our clinic review tool, if applicable. No additional management support is needed unless otherwise documented below in the visit note. 

## 2016-06-06 NOTE — Assessment & Plan Note (Signed)
-   DEXA scan ordered

## 2016-06-06 NOTE — Assessment & Plan Note (Signed)
Referred to dermatology for evaluation. 

## 2016-06-13 ENCOUNTER — Encounter: Payer: Self-pay | Admitting: Family Medicine

## 2016-06-19 ENCOUNTER — Other Ambulatory Visit: Payer: Self-pay | Admitting: Nurse Practitioner

## 2016-06-22 ENCOUNTER — Other Ambulatory Visit: Payer: Self-pay | Admitting: Nurse Practitioner

## 2016-06-27 ENCOUNTER — Encounter: Payer: Self-pay | Admitting: Family Medicine

## 2016-07-01 ENCOUNTER — Encounter: Payer: Self-pay | Admitting: Family Medicine

## 2016-07-09 ENCOUNTER — Ambulatory Visit (INDEPENDENT_AMBULATORY_CARE_PROVIDER_SITE_OTHER): Payer: Medicare Other

## 2016-07-09 VITALS — BP 110/64 | HR 64 | Temp 98.0°F | Ht 65.5 in | Wt 170.1 lb

## 2016-07-09 DIAGNOSIS — Z23 Encounter for immunization: Secondary | ICD-10-CM | POA: Diagnosis not present

## 2016-07-09 DIAGNOSIS — Z0000 Encounter for general adult medical examination without abnormal findings: Secondary | ICD-10-CM

## 2016-07-09 DIAGNOSIS — Z1159 Encounter for screening for other viral diseases: Secondary | ICD-10-CM

## 2016-07-09 NOTE — Patient Instructions (Addendum)
  Ms. Behl , Thank you for taking time to come for your Medicare Wellness Visit. I appreciate your ongoing commitment to your health goals. Please review the following plan we discussed and let me know if I can assist you in the future.   FOLLOW UP WITH DR. SONNENBERG AS NEEDED.  These are the goals we discussed: Goals    . Increase physical activity          Swim 3 times weekly at the [facility]    . Increase water intake       This is a list of the screening recommended for you and due dates:  Health Maintenance  Topic Date Due  .  Hepatitis C: One time screening is recommended by Center for Disease Control  (CDC) for  adults born from [birth year] through 1966.   Oct 01, 1950  . Mammogram  02/06/2017  . Colon Cancer Screening  09/23/2018  . Tetanus Vaccine  10/24/2024  . Flu Shot  Completed  . DEXA scan (bone density measurement)  Completed  . Shingles Vaccine  Addressed  . Pneumonia vaccines  Completed

## 2016-07-09 NOTE — Progress Notes (Signed)
Subjective:   Deanna Ross is a 65 y.o. female who presents for an Initial Medicare Annual Wellness Visit.  Review of Systems    No ROS.  Medicare Wellness Visit.  Cardiac Risk Factors include: advanced age (>65men, >38 women);hypertension     Objective:    Today's Vitals   07/09/16 0823  BP: 110/64  Pulse: 64  Temp: 98 F (36.7 C)  TempSrc: Oral  Weight: 170 lb 1.9 oz (77.2 kg)  Height: 5' 5.5" (1.664 m)   Body mass index is 27.88 kg/m.   Current Medications (verified) Outpatient Encounter Prescriptions as of 07/09/2016  Medication Sig  . amLODipine (NORVASC) 10 MG tablet TAKE 1 TABLET BY MOUTH EVERY DAY  . atorvastatin (LIPITOR) 20 MG tablet TAKE 1 TABLET(20 MG) BY MOUTH DAILY  . esomeprazole (NEXIUM) 40 MG capsule TAKE 1 CAPSULE(40 MG) BY MOUTH DAILY  . lisinopril (PRINIVIL,ZESTRIL) 10 MG tablet TAKE 1 TABLET BY MOUTH EVERY DAY  . predniSONE (DELTASONE) 10 MG tablet Take 5 tablets PO x 3 days, 4 tablets PO x 3 days, 3 tablets PO x 3 days, 2 ts PO x 3 days, 1 t PO x 3 days   No facility-administered encounter medications on file as of 07/09/2016.     Allergies (verified) Gadolinium derivatives; Iodine; and Tdap [diphth-acell pertussis-tetanus]   History: Past Medical History:  Diagnosis Date  . Cancer [facility])    Has Breast CA gene  . Heart murmur    Past Surgical History:  Procedure Laterality Date  . ABDOMINAL HYSTERECTOMY  2008  . BREAST BIOPSY Left    negative  . BREAST CYST ASPIRATION Left    negative  . TONSILLECTOMY AND ADENOIDECTOMY    . TUBAL LIGATION     Family History  Problem Relation Age of Onset  . Hyperlipidemia Mother   . Hypertension Mother   . Cancer Daughter     Breast  . Breast cancer Daughter 76  . Cancer Paternal Aunt     Breast  . Cancer Maternal Grandmother     Breast  . Breast cancer Maternal Grandmother 51  . Breast cancer Sister 48  . Cancer Paternal Uncle     Prostate   Social History   Occupational History   . Not on file.   Social History Main Topics  . Smoking status: Never Smoker  . Smokeless tobacco: Never Used  . Alcohol use No  . Drug use: No  . Sexual activity: No    Tobacco Counseling Counseling given: Not Answered   Activities of Daily Living In your present state of health, do you have any difficulty performing the following activities: 07/09/2016  Hearing? N  Vision? N  Difficulty concentrating or making decisions? N  Walking or climbing stairs? Y  Dressing or bathing? N  Doing errands, shopping? N  Preparing Food and eating ? N  Using the Toilet? N  In the past six months, have you accidently leaked urine? N  Do you have problems with loss of bowel control? N  Managing your Medications? N  Managing your Finances? N  Housekeeping or managing your Housekeeping? N  Some recent data might be hidden    Immunizations and Health Maintenance Immunization History  Administered Date(s) Administered  . Influenza Split 06/23/2014  . Influenza, High Dose Seasonal PF 06/06/2016  . Influenza-Unspecified 07/06/2015  . Pneumococcal Conjugate-13 11/30/2014  . Pneumococcal Polysaccharide-23 07/09/2016   Health Maintenance Due  Topic Date Due  . Hepatitis C Screening  [birth year]  Patient Care Team: Leone Haven, MD as PCP - General (Family Medicine)  Indicate any recent Medical Services you may have received from other than [facility] providers in the past year (date may be approximate).     Assessment:   This is a routine wellness examination for [place]. The goal of the wellness visit is to assist the patient how to close the gaps in care and create a preventative care plan for the patient.   Osteoporosis reviewed.  Medications reviewed; taking without issues or barriers.  Safety issues reviewed; smoke detectors in the home. No firearms in the home. Wears seatbelts when driving or riding with others. No violence in the home.  No identified risk were noted;  The patient was oriented x 3; appropriate in dress and manner and no objective failures at ADL's or IADL's.   She has seen her dentist ([facility]) in the last 10 months.    Body mass index; discussed the importance of a healthy diet, water intake and exercise. Educational material provided.  Pneumococcal 23 vaccine administered L deltoid, tolerated well.  Educational material provided.   Hepatitis C screening educational material provided.    Patient Concerns: She requests ADD on Hepatitis C screening to upcoming lab appointment.    Hearing/Vision screen Hearing Screening Comments: Passes the whisper test Vision Screening Comments: Followed by [facility] Wears glasses Last OV 2012 Encouraged to make an appointment with her eye doctor.  Dietary issues and exercise activities discussed: Current Exercise Habits: Structured exercise class (Swimming), Time (Minutes): 60, Frequency (Times/Week): 1, Weekly Exercise (Minutes/Week): 60, Intensity: Moderate  Goals    . Increase physical activity          Swim 3 times weekly at the [facility]    . Increase water intake      Depression Screen PHQ 2/9 Scores 07/09/2016 11/30/2014  PHQ - 2 Score 0 0    Fall Risk Fall Risk  07/09/2016 11/30/2014  Falls in the past year? No No    Cognitive Function: MMSE - Mini Mental State Exam 07/09/2016  Orientation to time 5  Orientation to Place 5  Registration 3  Attention/ Calculation 5  Recall 3  Language- name 2 objects 2  Language- repeat 1  Language- follow 3 step command 3  Language- read & follow direction 1  Write a sentence 1  Copy design 1  Total score 30    Screening Tests Health Maintenance  Topic Date Due  . Hepatitis C Screening  May 16, 1951  . MAMMOGRAM  02/06/2017  . COLONOSCOPY  09/23/2018  . TETANUS/TDAP  10/24/2024  . INFLUENZA VACCINE  Completed  . DEXA SCAN  Completed  . ZOSTAVAX  Addressed  . PNA vac Low Risk Adult  Completed      Plan:     End of life planning; Advance aging; Advanced directives discussed. Copy of current Living Will requested.  Medicare Attestation I have personally reviewed: The patient's medical and social history Their use of alcohol, tobacco or illicit drugs Their current medications and supplements The patient's functional ability including ADLs,fall risks, home safety risks, cognitive, and hearing and visual impairment Diet and physical activities Evidence for depression   The patient's weight, height, BMI, and visual acuity have been recorded in the chart.  I have made referrals and provided education to the patient based on review of the above and I have provided the patient with a written personalized care plan for preventive services.    During the course  of the visit, [place] was educated and counseled about the following appropriate screening and preventive services:   Vaccines to include Pneumoccal, Influenza, Hepatitis B, Td, Zostavax, HCV  Electrocardiogram  Cardiovascular disease screening  Colorectal cancer screening  Bone density screening  Diabetes screening  Glaucoma screening  Mammography/PAP  Nutrition counseling  Smoking cessation counseling  Patient Instructions (the written plan) were given to the patient.    Varney Biles, LPN   [id number]

## 2016-07-12 NOTE — Progress Notes (Signed)
I have reviewed the above note and agree. Hep C Ab ordered.   [name], MD

## 2016-07-12 NOTE — Addendum Note (Signed)
Addended by: Caryl Bis, Tyreesha Maharaj G on: 07/12/2016 01:04 PM   Modules accepted: Orders

## 2016-07-23 ENCOUNTER — Other Ambulatory Visit (INDEPENDENT_AMBULATORY_CARE_PROVIDER_SITE_OTHER): Payer: Medicare Other

## 2016-07-23 DIAGNOSIS — I10 Essential (primary) hypertension: Secondary | ICD-10-CM

## 2016-07-23 DIAGNOSIS — Z1159 Encounter for screening for other viral diseases: Secondary | ICD-10-CM | POA: Diagnosis not present

## 2016-07-23 LAB — COMPREHENSIVE METABOLIC PANEL
ALK PHOS: 72 U/L (ref 39–117)
ALT: 15 U/L (ref 0–35)
AST: 13 U/L (ref 0–37)
Albumin: 4.7 g/dL (ref 3.5–5.2)
BILIRUBIN TOTAL: 0.5 mg/dL (ref 0.2–1.2)
BUN: 18 mg/dL (ref 6–23)
CALCIUM: 10.2 mg/dL (ref 8.4–10.5)
CO2: 29 meq/L (ref 19–32)
Chloride: 107 mEq/L (ref 96–112)
Creatinine, Ser: 0.69 mg/dL (ref 0.40–1.20)
GFR: 90.29 mL/min (ref >60.00)
Glucose, Bld: 111 mg/dL — ABNORMAL HIGH (ref 70–99)
POTASSIUM: 4.4 meq/L (ref 3.5–5.1)
Sodium: 143 mEq/L (ref 135–145)
Total Protein: 7.1 g/dL (ref 6.0–8.3)

## 2016-07-23 LAB — LIPID PANEL
CHOL/HDL RATIO: 3
Cholesterol: 166 mg/dL (ref 0–200)
HDL: 55 mg/dL (ref >39.00)
LDL Cholesterol: 72 mg/dL (ref 0–99)
NonHDL: 110.95
TRIGLYCERIDES: 197 mg/dL — AB (ref 0.0–149.0)
VLDL: 39.4 mg/dL (ref 0.0–40.0)

## 2016-07-23 LAB — HEMOGLOBIN A1C: Hgb A1c MFr Bld: 5.6 % (ref 4.6–6.5)

## 2016-07-24 LAB — HEPATITIS C ANTIBODY: HCV Ab: NEGATIVE

## 2016-07-30 ENCOUNTER — Other Ambulatory Visit: Payer: Self-pay | Admitting: Family Medicine

## 2016-07-30 ENCOUNTER — Ambulatory Visit
Admission: RE | Admit: 2016-07-30 | Discharge: 2016-07-30 | Disposition: A | Discharge status: Home / Self Care | Payer: Medicare Other | Source: Ambulatory Visit | Attending: Family Medicine | Admitting: Family Medicine

## 2016-07-30 DIAGNOSIS — Z1239 Encounter for other screening for malignant neoplasm of breast: Secondary | ICD-10-CM

## 2016-07-30 DIAGNOSIS — Z1231 Encounter for screening mammogram for malignant neoplasm of breast: Secondary | ICD-10-CM | POA: Diagnosis not present

## 2016-07-30 DIAGNOSIS — M810 Age-related osteoporosis without current pathological fracture: Secondary | ICD-10-CM

## 2016-07-30 LAB — HM MAMMOGRAPHY

## 2016-08-01 ENCOUNTER — Ambulatory Visit (INDEPENDENT_AMBULATORY_CARE_PROVIDER_SITE_OTHER): Payer: Medicare Other | Admitting: Family Medicine

## 2016-08-01 ENCOUNTER — Encounter: Payer: Self-pay | Admitting: Family Medicine

## 2016-08-01 VITALS — BP 122/70 | HR 70 | Temp 98.0°F | Ht 64.0 in | Wt 170.2 lb

## 2016-08-01 DIAGNOSIS — Z1501 Genetic susceptibility to malignant neoplasm of breast: Secondary | ICD-10-CM

## 2016-08-01 DIAGNOSIS — Z1509 Genetic susceptibility to other malignant neoplasm: Principal | ICD-10-CM

## 2016-08-01 DIAGNOSIS — Z1502 Genetic susceptibility to malignant neoplasm of ovary: Secondary | ICD-10-CM | POA: Diagnosis not present

## 2016-08-01 DIAGNOSIS — Z0000 Encounter for general adult medical examination without abnormal findings: Secondary | ICD-10-CM

## 2016-08-01 NOTE — Patient Instructions (Signed)
Nice to see you. Please start working on diet and exercise again. We'll get you to see genetics counselor to determine the best way to monitor your breast health given that you are BRCA2 positive.

## 2016-08-01 NOTE — Assessment & Plan Note (Addendum)
Overall doing well. Recent lab work reviewed. Mammogram reviewed. Breast exam performed with no masses palpated. Patient does have a history of BRCA2 and is status post hysterectomy and BSO for this. Most recent Pap smear unremarkable and is scanned and under the media tab. Pelvic exam deferred given this. Plan on pelvic exam next year at physical exam. We will refer to genetics for further counseling and advice on screening test for her BRCA2 regarding breast cancer as there was some mention of obtaining MRIs yearly through her prior gynecologist. Referral was placed. Advised on diet and exercise. Encouraged to see a dentist and ophthalmologist. She will follow-up in 6 months.

## 2016-08-01 NOTE — Progress Notes (Signed)
[name], MD Phone: [phone]  Deanna Ross is a 65 y.o. female who presents today for physical exam.  Diet has not been great as her father recently died.  Has not been exercising much given that her father died recently. Pap smear was last done 2 years ago. She has had a hysterectomy with removal of her cervix and bilateral ovaries given that she is BRCA2 positive. The Pap smear reported endocervical cells. It was negative. Does not appear that HPV was tested. If anything she would need a Pap smear next year or not at all. Mammogram done this year. There is some mention in the gynecologist note of doing MRIs of her breasts yearly. Colonoscopy up-to-date. Vaccinations up-to-date. Hepatitis C negative. Tobacco, alcohol, and illicit drug use negative. Not up-to-date on ophthalmology and dental care.  Active Ambulatory Problems    Diagnosis Date Noted  . Welcome to Medicare preventive visit 10/24/2014  . Routine general medical examination at a health care facility 12/01/2014  . BRCA2 positive 01/24/2015  . HTN (hypertension) 01/24/2015  . Hyperlipidemia 11/30/2015  . Left knee pain 12/09/2015  . Rash and nonspecific skin eruption 01/22/2016  . Osteoporosis 06/06/2016  . Actinic keratosis 06/06/2016  . GERD (gastroesophageal reflux disease) 06/06/2016   Resolved Ambulatory Problems    Diagnosis Date Noted  . No Resolved Ambulatory Problems   Past Medical History:  Diagnosis Date  . Cancer ([place])   . Heart murmur     Family History  Problem Relation Age of Onset  . Hyperlipidemia Mother   . Hypertension Mother   . Cancer Daughter     Breast  . Breast cancer Daughter 20  . Cancer Paternal Aunt     Breast  . Cancer Maternal Grandmother     Breast  . Breast cancer Maternal Grandmother 67  . Breast cancer Sister 11  . Cancer Paternal Uncle     Prostate    Social History   Social History  . Marital status: Married    Spouse name: N/A  . Number of  children: N/A  . Years of education: N/A   Occupational History  . Not on file.   Social History Main Topics  . Smoking status: Never Smoker  . Smokeless tobacco: Never Used  . Alcohol use No  . Drug use: No  . Sexual activity: No   Other Topics Concern  . Not on file   Social History Narrative   Retired 4 years ago from [employer] with husband   3 daughters (65, 64, 22)    38 Grandchildren and 1 great grandchild    Enjoys swimming   2 cats and 1 dog- live inside the house     ROS  General:  Negative for nexplained weight loss, fever Skin: Negative for new or changing mole, sore that won't heal HEENT: Negative for trouble hearing, trouble seeing, ringing in ears, mouth sores, hoarseness, change in voice, dysphagia. CV:  Negative for chest pain, dyspnea, edema, palpitations Resp: Negative for cough, dyspnea, hemoptysis GI: Negative for nausea, vomiting, diarrhea, constipation, abdominal pain, melena, hematochezia. GU: Negative for dysuria, incontinence, urinary hesitance, hematuria, vaginal or penile discharge, polyuria, sexual difficulty, lumps in testicle or breasts MSK: Negative for muscle cramps or aches, joint pain or swelling Neuro: Negative for headaches, weakness, numbness, dizziness, passing out/fainting Psych: Negative for depression, anxiety, memory problems  Objective  Physical Exam Vitals:   08/01/16 1426  BP: 122/70  Pulse: 70  Temp: 98 F (36.7  C)    BP Readings from Last 3 Encounters:  08/01/16 122/70  07/09/16 110/64  06/06/16 112/66   Wt Readings from Last 3 Encounters:  08/01/16 170 lb 3.2 oz (77.2 kg)  07/09/16 170 lb 1.9 oz (77.2 kg)  06/06/16 170 lb 9.6 oz (77.4 kg)    Physical Exam  Constitutional: No distress.  HENT:  Mouth/Throat: Oropharynx is clear and moist. No oropharyngeal exudate.  Eyes: Conjunctivae are normal. Pupils are equal, round, and reactive to light.  Cardiovascular: Normal rate, regular rhythm and normal  heart sounds.   Pulmonary/Chest: Effort normal and breath sounds normal.  Abdominal: Soft. Bowel sounds are normal. She exhibits no distension. There is no tenderness.  Genitourinary:  Genitourinary Comments: Bilateral breasts with no masses or nipple inversion, left breast with scar noted at the 12:00 position upper breast, no other skin changes, no axillary masses  Musculoskeletal: She exhibits no edema.  Neurological: She is alert. Gait normal.  Skin: Skin is warm and dry. She is not diaphoretic.  Psychiatric: Mood and affect normal.     Assessment/Plan:   Routine general medical examination at a health care facility Overall doing well. Recent lab work reviewed. Mammogram reviewed. Breast exam performed with no masses palpated. Patient does have a history of BRCA2 and is status post hysterectomy and BSO for this. Most recent Pap smear unremarkable and is scanned and under the media tab. Pelvic exam deferred given this. Plan on pelvic exam next year at physical exam. We will refer to genetics for further counseling and advice on screening test for her BRCA2 regarding breast cancer as there was some mention of obtaining MRIs yearly through her prior gynecologist. Referral was placed. Advised on diet and exercise. Encouraged to see a dentist and ophthalmologist. She will follow-up in 6 months.   Orders Placed This Encounter  Procedures  . Ambulatory referral to Genetics    Referral Priority:   Routine    Referral Type:   Consultation    Referral Reason:   Specialty Services Required    Number of Visits Requested:   1    No orders of the defined types were placed in this encounter.    [name], MD [place]

## 2016-08-12 NOTE — Progress Notes (Signed)
Pt was scheduled for her dexa scan on 11/7. She has cancelled that appointment. It states that she will call back to schedule the appointment.

## 2016-08-13 DIAGNOSIS — H40003 Preglaucoma, unspecified, bilateral: Secondary | ICD-10-CM | POA: Diagnosis not present

## 2016-08-13 DIAGNOSIS — H2513 Age-related nuclear cataract, bilateral: Secondary | ICD-10-CM | POA: Diagnosis not present

## 2016-08-26 ENCOUNTER — Other Ambulatory Visit: Payer: Self-pay | Admitting: Nurse Practitioner

## 2016-08-26 DIAGNOSIS — Z1379 Encounter for other screening for genetic and chromosomal anomalies: Secondary | ICD-10-CM | POA: Insufficient documentation

## 2016-08-26 NOTE — Progress Notes (Signed)
[place]  [phone] [phone]) [phone]  ID: Randa Spike OB: 1951/08/01  MR#: [id number]  [id number]  Patient Care Team: Leone Haven, MD as PCP - General (Family Medicine)  CHIEF COMPLAINT: BCRA-2 positive.  INTERVAL HISTORY: Patient is a 65 year old female with no personal history of breast cancer, but had a daughter who was diagnosed with breast cancer in her 34s and found to be BCRA-2 positive. Patient was subsequently tested 8-10 years ago and also found to be positive. She is referred to clinic today discuss options for breast cancer surveillance. Patient has had a total hysterectomy. She currently feels well and is asymptomatic. She has no neurologic complaint. She denies any recent fevers or illnesses. She has good appetite and denies weight loss. She has no chest pain or shortness of breath. She denies any nausea, vomiting, constipation, or diarrhea. She has no urinary complaint. Patient feels at her baseline and offers no specific complaints today.  REVIEW OF SYSTEMS:   Review of Systems  Constitutional: Negative.  Negative for fever, malaise/fatigue and weight loss.  Respiratory: Negative.  Negative for shortness of breath.   Cardiovascular: Negative.  Negative for chest pain and leg swelling.  Gastrointestinal: Negative.  Negative for abdominal pain.  Musculoskeletal: Negative.   Neurological: Negative.  Negative for weakness.  Psychiatric/Behavioral: Negative.  The patient is not nervous/anxious.     As per HPI. Otherwise, a complete review of systems is negative.  PAST MEDICAL HISTORY: Past Medical History:  Diagnosis Date  . Cancer [facility])    Has Breast CA gene  . Heart murmur     PAST SURGICAL HISTORY: Past Surgical History:  Procedure Laterality Date  . ABDOMINAL HYSTERECTOMY  2008  . BREAST BIOPSY Left    negative x2 done at same time  . BREAST CYST ASPIRATION Left    negative  . TONSILLECTOMY AND  ADENOIDECTOMY    . TUBAL LIGATION      FAMILY HISTORY: Family History  Problem Relation Age of Onset  . Hyperlipidemia Mother   . Hypertension Mother   . Cancer Daughter     Breast  . Breast cancer Daughter 70  . Cancer Paternal Aunt     Breast  . Cancer Maternal Grandmother     Breast  . Breast cancer Maternal Grandmother 40  . Breast cancer Sister 26  . Cancer Paternal Uncle     Prostate    ADVANCED DIRECTIVES (Y/N):  N  HEALTH MAINTENANCE: Social History  Substance Use Topics  . Smoking status: Never Smoker  . Smokeless tobacco: Never Used  . Alcohol use No     Colonoscopy:  PAP:  Bone density:  Lipid panel:  Allergies  Allergen Reactions  . Gadolinium Derivatives Hives and Itching    Itching in throat, hives, headache (on 01/31/14) WILL NEED 13-HOUR PREP IN FUTURE  . Iodine Hives  . Tdap [Diphth-Acell Pertussis-Tetanus] Swelling    Current Outpatient Prescriptions  Medication Sig Dispense Refill  . amLODipine (NORVASC) 10 MG tablet TAKE 1 TABLET BY MOUTH EVERY DAY 90 tablet 0  . atorvastatin (LIPITOR) 20 MG tablet TAKE 1 TABLET(20 MG) BY MOUTH DAILY 90 tablet 0  . Calcium Carb-Cholecalciferol (CALCIUM 1000 + D PO) Take by mouth.    . esomeprazole (NEXIUM) 40 MG capsule TAKE 1 CAPSULE(40 MG) BY MOUTH DAILY 90 capsule 0  . lisinopril (PRINIVIL,ZESTRIL) 10 MG tablet TAKE 1 TABLET BY MOUTH EVERY DAY 90 tablet 1  . Multiple Vitamins-Minerals (MULTIVITAMIN ADULT PO) Take by  mouth.     No current facility-administered medications for this visit.     OBJECTIVE: Vitals:   08/27/16 1446  BP: 132/82  Pulse: 70  Resp: 18  Temp: 98.4 F (36.9 C)     Body mass index is 29.16 kg/m.    ECOG FS:0 - Asymptomatic  General: Well-developed, well-nourished, no acute distress. Eyes: Pink conjunctiva, anicteric sclera. Musculoskeletal: No edema, cyanosis, or clubbing. Neuro: Alert, answering all questions appropriately. Cranial nerves grossly intact. Skin: No  rashes or petechiae noted. Psych: Normal affect.  LAB RESULTS:  Lab Results  Component Value Date   NA 143 07/23/2016   K 4.4 07/23/2016   CL 107 07/23/2016   CO2 29 07/23/2016   GLUCOSE 111 (H) 07/23/2016   BUN 18 07/23/2016   CREATININE 0.69 07/23/2016   CALCIUM 10.2 07/23/2016   PROT 7.1 07/23/2016   ALBUMIN 4.7 07/23/2016   AST 13 07/23/2016   ALT 15 07/23/2016   ALKPHOS 72 07/23/2016   BILITOT 0.5 07/23/2016    Lab Results  Component Value Date   WBC 7.6 11/30/2015   NEUTROABS 4.8 11/30/2015   HGB 13.5 11/30/2015   HCT 40.0 11/30/2015   MCV 88.9 11/30/2015   PLT 244.0 11/30/2015     STUDIES: No results found.  ASSESSMENT: BCRA-2 positive.  PLAN:    1. BCRA-2 positive: Patient has no personal history of breast cancer, but her daughter was diagnosed in her 8s which led to her testing. She had a total hysterectomy 8-10 years ago. Patient continues to get regular yearly mammograms. I do not think MRI screening would give any additional benefit at this time as long as her mammograms continue to be completed yearly and remain normal. We also discussed the increased risk of melanoma and patient states she has an appointment with dermatology in the next several weeks. Although rare, she is also at increased risk for pancreatic cancer and although there is no screening test, she was educated on possible signs and symptoms. No further intervention is needed. No follow-up has been scheduled. Please refer patient back if there are any questions or concerns.  Approximately 45 minutes was spent in discussion of which greater than 50% was consultation.   Patient expressed understanding and was in agreement with this plan. She also understands that She can call clinic at any time with any questions, concerns, or complaints.   Lloyd Huger, MD   08/30/2016 2:18 PM

## 2016-08-27 ENCOUNTER — Inpatient Hospital Stay: Payer: Medicare Other | Attending: Oncology | Admitting: Oncology

## 2016-08-27 VITALS — BP 132/82 | HR 70 | Temp 98.4°F | Resp 18 | Wt 169.9 lb

## 2016-08-27 DIAGNOSIS — Z90710 Acquired absence of both cervix and uterus: Secondary | ICD-10-CM | POA: Diagnosis not present

## 2016-08-27 DIAGNOSIS — Z1509 Genetic susceptibility to other malignant neoplasm: Secondary | ICD-10-CM

## 2016-08-27 DIAGNOSIS — Z853 Personal history of malignant neoplasm of breast: Secondary | ICD-10-CM | POA: Diagnosis not present

## 2016-08-27 DIAGNOSIS — Z7183 Encounter for nonprocreative genetic counseling: Secondary | ICD-10-CM | POA: Insufficient documentation

## 2016-08-27 DIAGNOSIS — R011 Cardiac murmur, unspecified: Secondary | ICD-10-CM | POA: Insufficient documentation

## 2016-08-27 DIAGNOSIS — Z8042 Family history of malignant neoplasm of prostate: Secondary | ICD-10-CM | POA: Insufficient documentation

## 2016-08-27 DIAGNOSIS — Z1501 Genetic susceptibility to malignant neoplasm of breast: Secondary | ICD-10-CM | POA: Insufficient documentation

## 2016-08-27 DIAGNOSIS — Z79899 Other long term (current) drug therapy: Secondary | ICD-10-CM | POA: Insufficient documentation

## 2016-09-15 ENCOUNTER — Other Ambulatory Visit: Payer: Self-pay | Admitting: Family Medicine

## 2016-10-21 DIAGNOSIS — L570 Actinic keratosis: Secondary | ICD-10-CM | POA: Diagnosis not present

## 2016-10-21 DIAGNOSIS — D485 Neoplasm of uncertain behavior of skin: Secondary | ICD-10-CM | POA: Diagnosis not present

## 2016-10-21 DIAGNOSIS — L820 Inflamed seborrheic keratosis: Secondary | ICD-10-CM | POA: Diagnosis not present

## 2016-10-21 DIAGNOSIS — Z85828 Personal history of other malignant neoplasm of skin: Secondary | ICD-10-CM | POA: Diagnosis not present

## 2016-10-21 DIAGNOSIS — L578 Other skin changes due to chronic exposure to nonionizing radiation: Secondary | ICD-10-CM | POA: Diagnosis not present

## 2016-11-23 ENCOUNTER — Other Ambulatory Visit: Payer: Self-pay | Admitting: Family Medicine

## 2016-12-13 ENCOUNTER — Other Ambulatory Visit: Payer: Self-pay | Admitting: Family Medicine

## 2016-12-23 DIAGNOSIS — L578 Other skin changes due to chronic exposure to nonionizing radiation: Secondary | ICD-10-CM | POA: Diagnosis not present

## 2016-12-23 DIAGNOSIS — D485 Neoplasm of uncertain behavior of skin: Secondary | ICD-10-CM | POA: Diagnosis not present

## 2016-12-23 DIAGNOSIS — Z1283 Encounter for screening for malignant neoplasm of skin: Secondary | ICD-10-CM | POA: Diagnosis not present

## 2016-12-23 DIAGNOSIS — Z85828 Personal history of other malignant neoplasm of skin: Secondary | ICD-10-CM | POA: Diagnosis not present

## 2016-12-23 DIAGNOSIS — L812 Freckles: Secondary | ICD-10-CM | POA: Diagnosis not present

## 2017-02-03 ENCOUNTER — Encounter: Payer: Self-pay | Admitting: Family Medicine

## 2017-02-03 ENCOUNTER — Ambulatory Visit (INDEPENDENT_AMBULATORY_CARE_PROVIDER_SITE_OTHER): Payer: Medicare Other | Admitting: Family Medicine

## 2017-02-03 VITALS — BP 118/66 | HR 70 | Temp 98.2°F | Wt 170.6 lb

## 2017-02-03 DIAGNOSIS — E785 Hyperlipidemia, unspecified: Secondary | ICD-10-CM

## 2017-02-03 DIAGNOSIS — R600 Localized edema: Secondary | ICD-10-CM | POA: Insufficient documentation

## 2017-02-03 DIAGNOSIS — I10 Essential (primary) hypertension: Secondary | ICD-10-CM | POA: Diagnosis not present

## 2017-02-03 DIAGNOSIS — Z1509 Genetic susceptibility to other malignant neoplasm: Secondary | ICD-10-CM

## 2017-02-03 DIAGNOSIS — L570 Actinic keratosis: Secondary | ICD-10-CM | POA: Diagnosis not present

## 2017-02-03 DIAGNOSIS — Z1501 Genetic susceptibility to malignant neoplasm of breast: Secondary | ICD-10-CM | POA: Diagnosis not present

## 2017-02-03 LAB — TSH: TSH: 0.69 u[IU]/mL (ref 0.35–4.50)

## 2017-02-03 LAB — COMPREHENSIVE METABOLIC PANEL
ALK PHOS: 59 U/L (ref 39–117)
ALT: 17 U/L (ref 0–35)
AST: 17 U/L (ref 0–37)
Albumin: 4.5 g/dL (ref 3.5–5.2)
BILIRUBIN TOTAL: 0.5 mg/dL (ref 0.2–1.2)
BUN: 17 mg/dL (ref 6–23)
CHLORIDE: 107 meq/L (ref 96–112)
CO2: 28 mEq/L (ref 19–32)
Calcium: 9.8 mg/dL (ref 8.4–10.5)
Creatinine, Ser: 0.75 mg/dL (ref 0.40–1.20)
GFR: 81.87 mL/min (ref >60.00)
GLUCOSE: 94 mg/dL (ref 70–99)
POTASSIUM: 4.2 meq/L (ref 3.5–5.1)
Sodium: 141 mEq/L (ref 135–145)
Total Protein: 7.3 g/dL (ref 6.0–8.3)

## 2017-02-03 LAB — CBC
HCT: 41.2 % (ref 36.0–46.0)
HEMOGLOBIN: 13.6 g/dL (ref 12.0–15.0)
MCHC: 33.1 g/dL (ref 30.0–36.0)
MCV: 91.3 fl (ref 78.0–100.0)
PLATELETS: 229 10*3/uL (ref 150.0–400.0)
RBC: 4.51 Mil/uL (ref 3.87–5.11)
RDW: 13.5 % (ref 11.5–15.5)
WBC: 6.6 10*3/uL (ref 4.0–10.5)

## 2017-02-03 NOTE — Progress Notes (Signed)
  [name], MD Phone: [phone]  Deanna Ross is a 66 y.o. female who presents today for f/u.  HYPERTENSION  Disease Monitoring  Home BP Monitoring not checking consistently Chest pain- no    Dyspnea- no Medications  Compliance-  Taking amlodipine, lisinopril  Edema- yes, no orthopnea or PND. Does go down somewhat overnight. Has been occurring over the last several months.  Hyperlipidemia: Is taking Lipitor. No right upper quadrant pain or myalgias. She is staying active and walking 1-2 times a day with her dog.  She had an actinic keratosis frozen by the dermatologist. This was done several months ago. Has not recurred.  She also saw oncology for genetic counseling regarding BRCA2 gene positivity. They recommended against MRI for her breasts. They also advised no further follow-up with them unless concerns arise. She'll continue yearly mammograms.  PMH: nonsmoker.   ROS see history of present illness  Objective  Physical Exam Vitals:   02/03/17 0953  BP: 118/66  Pulse: 70  Temp: 98.2 F (36.8 C)    BP Readings from Last 3 Encounters:  02/03/17 118/66  08/27/16 132/82  08/01/16 122/70   Wt Readings from Last 3 Encounters:  02/03/17 170 lb 9.6 oz (77.4 kg)  08/27/16 169 lb 13.8 oz (77 kg)  08/01/16 170 lb 3.2 oz (77.2 kg)    Physical Exam  Constitutional: No distress.  Cardiovascular: Normal rate, regular rhythm and normal heart sounds.   Pulmonary/Chest: Effort normal and breath sounds normal.  Musculoskeletal:  Trace edema at ankles  Neurological: She is alert. Gait normal.  Skin: Skin is warm and dry. She is not diaphoretic.   no noted actinic keratosis over her nose.   Assessment/Plan: Please see individual problem list.  HTN (hypertension) At goal. Trace edema possibly related amlodipine. She'll discontinue this. She'll monitor her blood pressures at home. She is given parameters to call with. She'll return in 2 weeks for nurse check of  blood pressure. If elevated at home or on recheck in the office would consider increasing lisinopril.  Actinic keratosis Status post treatment to dermatology. She'll monitor for recurrence.  BRCA2 positive Underwent genetics counseling. We'll continue yearly mammograms.  Hyperlipidemia Continue Lipitor. Check CMP.  Pedal edema Suspect related amlodipine. No CHF symptoms. We will check lab work as outlined below and have her discontinue the amlodipine. She'll monitor for improvement.   Orders Placed This Encounter  Procedures  . Comp Met (CMET)  . CBC  . TSH   [name], MD [place]

## 2017-02-03 NOTE — Assessment & Plan Note (Signed)
Underwent genetics counseling. We'll continue yearly mammograms.

## 2017-02-03 NOTE — Assessment & Plan Note (Signed)
Suspect related amlodipine. No CHF symptoms. We will check lab work as outlined below and have her discontinue the amlodipine. She'll monitor for improvement.

## 2017-02-03 NOTE — Assessment & Plan Note (Signed)
Continue Lipitor. Check CMP.

## 2017-02-03 NOTE — Assessment & Plan Note (Signed)
At goal. Trace edema possibly related amlodipine. She'll discontinue this. She'll monitor her blood pressures at home. She is given parameters to call with. She'll return in 2 weeks for nurse check of blood pressure. If elevated at home or on recheck in the office would consider increasing lisinopril.

## 2017-02-03 NOTE — Patient Instructions (Signed)
Nice to see you. We will check lab work today and contact you with the results. Please discontinue the amlodipine. Please monitor your blood pressure over the next several weeks daily. If it is greater than 140/90 consistently please call us.

## 2017-02-03 NOTE — Assessment & Plan Note (Signed)
Status post treatment to dermatology. She'll monitor for recurrence.

## 2017-02-18 ENCOUNTER — Ambulatory Visit (INDEPENDENT_AMBULATORY_CARE_PROVIDER_SITE_OTHER): Payer: Medicare Other

## 2017-02-18 VITALS — BP 138/80 | HR 72 | Resp 18

## 2017-02-18 DIAGNOSIS — I10 Essential (primary) hypertension: Secondary | ICD-10-CM

## 2017-02-18 MED ORDER — LISINOPRIL 20 MG PO TABS: 20.0000 mg | ORAL_TABLET | Freq: Every day | ORAL | 3 refills | Status: DC

## 2017-02-18 NOTE — Progress Notes (Signed)
We will increase her dose to lisinopril 20 mg by mouth daily. A new prescription was sent to her pharmacy. I have placed an order for a BMP. Please get her set up for recheck of blood pressure and blood work. Thanks.

## 2017-02-18 NOTE — Progress Notes (Signed)
Patient came in for follow up BP check from last OV, was told to discontinue Amlodopine due to swelling in ankles.  Since stopping, decrease in swelling noted. Currently taking lisinopril. See vitals section for today's BP's.    Patient is checking BP at home, not consistently at a certain time each day, but once a day.  Below are her readings she provided,  5/15: 129/57  5/16:128/63 5/17: 148/72 5/18: 155/42 5/19: 143/71 5/20: 140/69 5/21: 145/78 5/22: 142/73 5/23: 146/79 5/24: 148/74 5/25: 142/73 5/26:144/77 5/28: 152/66 5/29: 152/67  Please advise, thanks

## 2017-02-18 NOTE — Addendum Note (Signed)
Addended by: Leone Haven on: 02/18/2017 04:55 PM   Modules accepted: Orders

## 2017-02-18 NOTE — Progress Notes (Signed)
Spoke with the patient, she would like to increase the lisinopril.  What dose do you want her to take? She is currently on 10mg .  I will schedule a NV and lab appt next week. thanks

## 2017-02-18 NOTE — Addendum Note (Signed)
Addended by: Caryl Bis, Setsuko Robins G on: 02/18/2017 11:12 AM   Modules accepted: Orders

## 2017-02-18 NOTE — Progress Notes (Signed)
Blood pressures are not at goal. She would benefit from either increased dose of lisinopril or adding an additional medication such as hydrochlorothiazide. It would likely be simpler to increase the dose of lisinopril though she will need to return in 1 week after increasing the dose for lab work. If she wanted to add chlorothiazide she would need to return in a month for lab work. Please see which she would like to do.  [name], M.D.

## 2017-02-19 NOTE — Progress Notes (Signed)
Notified patient and she will increase dose and scheduled for both lab and nurse visit next thursday. thanks

## 2017-02-27 ENCOUNTER — Other Ambulatory Visit (INDEPENDENT_AMBULATORY_CARE_PROVIDER_SITE_OTHER): Payer: Medicare Other

## 2017-02-27 ENCOUNTER — Ambulatory Visit (INDEPENDENT_AMBULATORY_CARE_PROVIDER_SITE_OTHER): Payer: Medicare Other | Admitting: *Deleted

## 2017-02-27 ENCOUNTER — Encounter: Payer: Self-pay | Admitting: *Deleted

## 2017-02-27 VITALS — BP 150/96 | HR 53 | Resp 16

## 2017-02-27 DIAGNOSIS — I10 Essential (primary) hypertension: Secondary | ICD-10-CM

## 2017-02-27 LAB — BASIC METABOLIC PANEL
BUN: 14 mg/dL (ref 6–23)
CO2: 28 meq/L (ref 19–32)
Calcium: 9.9 mg/dL (ref 8.4–10.5)
Chloride: 107 mEq/L (ref 96–112)
Creatinine, Ser: 0.82 mg/dL (ref 0.40–1.20)
GFR: 73.85 mL/min (ref >60.00)
GLUCOSE: 102 mg/dL — AB (ref 70–99)
POTASSIUM: 4.7 meq/L (ref 3.5–5.1)
SODIUM: 142 meq/L (ref 135–145)

## 2017-02-27 NOTE — Progress Notes (Signed)
Discussed with nurse. Was informed patient had no symptoms. I would have liked to have these rechecked though the patient had gone to the lab and had blood drawn out of her right arm thus blood pressures in both arms could not be rechecked. We will have her return for recheck of blood pressures in both arms. If they are continues to be a difference we may need to consider further evaluation to look for a cause of this difference. If she develops chest pain, shortness of breath, claudication symptoms such as pain with use of a particular arm she should be evaluated. Please get her set up for follow-up blood pressure checks in both arms. Her blood pressure is not well controlled. She was not supposed to be taking amlodipine. Please confirm whether she is taking this. We will need to either increase her lisinopril again or add HCTZ. Please see what she would prefer. Thanks.

## 2017-02-27 NOTE — Progress Notes (Signed)
Patient taking amlodipine and lisinopril took both medications around 7:30 this morning, BP left arm 140/84 pulse 60 , BP right arm 150/96 pulse 53. Patient had notably more audible pulse on right side than left. Advised PCP and was allowed to let patient go.

## 2017-02-27 NOTE — Progress Notes (Signed)
Patient stated she re-checked her pill box and it was just the lisinopril 20 mg she is taking, remembered PCP had advised her to stop amlodipine for swelling in her legs. Patient was scheduled for nurse visit for re-check on BP for 03/06/17 this is the first date patient could come in.   Explained to patient should have any chest pain, numbness in arm or thingling she she should be evaluated immediately patient voiced understanding.

## 2017-03-04 ENCOUNTER — Telehealth: Payer: Self-pay | Admitting: Family Medicine

## 2017-03-04 DIAGNOSIS — I10 Essential (primary) hypertension: Secondary | ICD-10-CM

## 2017-03-04 NOTE — Telephone Encounter (Signed)
Patient notified to increase lisinopril to 40 mg patient is going took 20 mg this morning and is going to take 20 more this afternoon to equal 40 mg lisinopril. Patient will FU with BP check tomorrow and schedule 1 week FU tomorrow for bP along with lab appointment.FYI

## 2017-03-04 NOTE — Telephone Encounter (Signed)
Advised physician assistant per PCP we could hold off until BP , patient and physician can get better control of BP patient will call office for Follow up appointment on BP.

## 2017-03-04 NOTE — Progress Notes (Signed)
Patient rescheduled for BP check tomorrow. BP elevated at the dentist's office. We'll see what it is tomorrow and likely increase her lisinopril.

## 2017-03-04 NOTE — Telephone Encounter (Signed)
Agree with holding off on procedure until BP better controlled. Patient scheduled for BP check tomorrow in the office. I would suggest she increase her lisinopril to 40 mg daily and we check labs in one week with repeat BP check at that time.

## 2017-03-04 NOTE — Telephone Encounter (Signed)
Order placed

## 2017-03-04 NOTE — Telephone Encounter (Signed)
Dental office called and stated that pt is in the office now and that they took her bp 3 times this morning it was 161/100 hr 66 166/95 hr 58 and lastly 168/88 hr 55. Do you want them to hold off on doing any dental procedures until her bp is controlled. Please advise, thank you!  Call [phone]

## 2017-03-04 NOTE — Telephone Encounter (Addendum)
Second call transferred to Nurse  Dental office reported blood pressure reading of 161/100 HR of 56 166/95 HR of 58 and 168/88 HR of 55

## 2017-03-04 NOTE — Telephone Encounter (Signed)
Patient has taken BP medication this morning lisinopril 20 mg, Patient is going to have to be numbed up for a build up and crown usually takes 1 to 2 hour for procedure Dentist would like to know if ok to proceed with reading in previous note.

## 2017-03-05 ENCOUNTER — Ambulatory Visit (INDEPENDENT_AMBULATORY_CARE_PROVIDER_SITE_OTHER): Payer: Medicare Other | Admitting: *Deleted

## 2017-03-05 VITALS — BP 138/72 | HR 67 | Resp 14

## 2017-03-05 DIAGNOSIS — I10 Essential (primary) hypertension: Secondary | ICD-10-CM | POA: Diagnosis not present

## 2017-03-05 NOTE — Progress Notes (Signed)
BP improved. No other changes to be made at this time. Plan to recheck as scheduled next week.   [name], MD

## 2017-03-05 NOTE — Progress Notes (Signed)
Patient presented for BP check after dentist visit on 03/04/17 where BP was recorded at 161/100 pulse 86 ,patient increased lisinopril as advised on 03/04/17 to 40 mg and took 40 mg at 8 am this morning. BP to day left arm 130/74 pulse 67 right arm 138/72 pulse 67. Patient scheduled lab appointment for [redacted] week along with nurse visit to re-check BP as advised on 03/04/17 by PCP.

## 2017-03-06 NOTE — Progress Notes (Signed)
Patient notified and voiced understanding.

## 2017-03-12 ENCOUNTER — Other Ambulatory Visit (INDEPENDENT_AMBULATORY_CARE_PROVIDER_SITE_OTHER): Payer: Medicare Other

## 2017-03-12 ENCOUNTER — Telehealth: Payer: Self-pay | Admitting: Family Medicine

## 2017-03-12 ENCOUNTER — Ambulatory Visit (INDEPENDENT_AMBULATORY_CARE_PROVIDER_SITE_OTHER): Payer: Medicare Other

## 2017-03-12 VITALS — BP 152/78

## 2017-03-12 DIAGNOSIS — I10 Essential (primary) hypertension: Secondary | ICD-10-CM

## 2017-03-12 LAB — BASIC METABOLIC PANEL
BUN: 17 mg/dL (ref 6–23)
CALCIUM: 9.6 mg/dL (ref 8.4–10.5)
CO2: 28 meq/L (ref 19–32)
CREATININE: 0.81 mg/dL (ref 0.40–1.20)
Chloride: 108 mEq/L (ref 96–112)
GFR: 74.89 mL/min (ref >60.00)
GLUCOSE: 85 mg/dL (ref 70–99)
Potassium: 4.3 mEq/L (ref 3.5–5.1)
SODIUM: 143 meq/L (ref 135–145)

## 2017-03-12 MED ORDER — HYDROCHLOROTHIAZIDE 12.5 MG PO TABS: 12.5000 mg | ORAL_TABLET | Freq: Every day | ORAL | 1 refills | Status: DC

## 2017-03-12 NOTE — Addendum Note (Signed)
Addended by: Leone Haven on: 03/12/2017 05:03 PM   Modules accepted: Orders

## 2017-03-12 NOTE — Progress Notes (Signed)
Patient was previously advised to take 40 mg daily of lisinopril following her prior blood pressure check. It was reported to me today that she was taking 20 mg twice daily. I advised that she could take 40 mg all at once. I would like to add hydrochlorothiazide to her regimen if she is willing. I can send this to her pharmacy if she is willing to take this. She should have follow-up with me about a month after starting the blood pressure medication.

## 2017-03-12 NOTE — Progress Notes (Signed)
Patient comes in today for BP check. Bp in left arm was 148/76 and and then her BP in her right arm was 152/78. Patient states that these numbers are a lot better than the one she receives at home. Advise patient to check to see if meter is working properly and if not she needs to get a new one. Patient is taking Lisinopril 20 mg qd per chart but was advise to do BID if needed. Per Dr. Caryl Bis patient is to do 40 mg daily. Patient left without any problems other than her BP. Was instructed by Dr. Caryl Bis she may leave as he will develop a plan of treatment for her.

## 2017-03-12 NOTE — Progress Notes (Signed)
Please send Rx to [facility] in [place], [place]

## 2017-03-12 NOTE — Telephone Encounter (Signed)
Error

## 2017-03-12 NOTE — Progress Notes (Signed)
Sent to pharmacy 

## 2017-03-12 NOTE — Progress Notes (Signed)
Patient is willing to do the HCTZ and will follow up in 1 month as request.

## 2017-03-12 NOTE — Telephone Encounter (Deleted)
Patient was previously advised to take 40 mg daily of lisinopril following her prior blood pressure check. It was reported to me today that she was taking 20 mg twice daily. I advised that she could take 40 mg all at once. I would like to add hydrochlorothiazide to her regimen if she is willing. I can send this to her pharmacy if she is willing to take this. She should have follow-up with me about a month after starting the blood pressure medication.

## 2017-03-13 ENCOUNTER — Other Ambulatory Visit: Payer: Self-pay | Admitting: Family Medicine

## 2017-04-23 ENCOUNTER — Telehealth: Payer: Self-pay | Admitting: Family Medicine

## 2017-05-07 ENCOUNTER — Telehealth: Payer: Self-pay | Admitting: Family Medicine

## 2017-05-07 MED ORDER — HYDROCHLOROTHIAZIDE 12.5 MG PO TABS: 12.5000 mg | ORAL_TABLET | Freq: Every day | ORAL | 1 refills | Status: DC

## 2017-05-07 NOTE — Telephone Encounter (Signed)
Pt called about needing a refill for hydrochlorothiazide (HYDRODIURIL) 12.5 MG tablet. Pt has enough to last until Monday.   Pharmacy is [facility], [address] AT [address]  Call pt @ [phone]. Thank you!

## 2017-05-07 NOTE — Telephone Encounter (Signed)
Last OV 02/03/17 last filled 03/12/17 90 1rf

## 2017-06-09 ENCOUNTER — Other Ambulatory Visit: Payer: Self-pay | Admitting: Family Medicine

## 2017-06-30 ENCOUNTER — Telehealth: Payer: Self-pay | Admitting: *Deleted

## 2017-06-30 MED ORDER — HYDROCHLOROTHIAZIDE 12.5 MG PO TABS: 12.5000 mg | ORAL_TABLET | Freq: Every day | ORAL | 1 refills | Status: DC

## 2017-06-30 NOTE — Telephone Encounter (Signed)
Last OV 02/03/17 last filled 05/07/17 90 1rf Sent to pharmacy

## 2017-06-30 NOTE — Telephone Encounter (Signed)
Pt has requested a medication refill for hydrochlorothiazide  Pharmacy [facility]

## 2017-07-09 ENCOUNTER — Ambulatory Visit (INDEPENDENT_AMBULATORY_CARE_PROVIDER_SITE_OTHER): Payer: Medicare Other

## 2017-07-09 VITALS — BP 136/78 | HR 63 | Temp 98.0°F | Resp 14 | Ht 64.75 in | Wt 169.8 lb

## 2017-07-09 DIAGNOSIS — Z1331 Encounter for screening for depression: Secondary | ICD-10-CM

## 2017-07-09 DIAGNOSIS — Z23 Encounter for immunization: Secondary | ICD-10-CM

## 2017-07-09 DIAGNOSIS — Z0000 Encounter for general adult medical examination without abnormal findings: Secondary | ICD-10-CM

## 2017-07-09 NOTE — Patient Instructions (Addendum)
  Ms. Gannett , Thank you for taking time to come for your Medicare Wellness Visit. I appreciate your ongoing commitment to your health goals. Please review the following plan we discussed and let me know if I can assist you in the future.   Follow up with Dr. Caryl Bis as needed.    Have a great day!  These are the goals we discussed: Goals    . Healthy Lifestyle          Stay hydrated Stay active Low carb foods    . Increase water intake       This is a list of the screening recommended for you and due dates:  Health Maintenance  Topic Date Due  . Mammogram  07/30/2018  . Colon Cancer Screening  09/23/2018  . Tetanus Vaccine  10/24/2024  . Flu Shot  Completed  . DEXA scan (bone density measurement)  Completed  .  Hepatitis C: One time screening is recommended by Center for Disease Control  (CDC) for  adults born from [birth year] through 1966.   Completed  . Pneumonia vaccines  Completed

## 2017-07-09 NOTE — Progress Notes (Signed)
Subjective:   Deanna Ross is a 66 y.o. female who presents for Medicare Annual (Subsequent) preventive examination.  Review of Systems:  No ROS.  Medicare Wellness Visit. Additional risk factors are reflected in the social history.  Cardiac Risk Factors include: hypertension;advanced age (>53men, >50 women)     Objective:     Vitals: BP 136/78 (BP Location: Left Arm, Patient Position: Sitting, Cuff Size: Normal)   Pulse 63   Temp 98 F (36.7 C) (Oral)   Resp 14   Ht 5' 4.75" (1.645 m)   Wt 169 lb 12.8 oz (77 kg)   SpO2 98%   BMI 28.47 kg/m   Body mass index is 28.47 kg/m.   Tobacco History  Smoking Status  . Never Smoker  Smokeless Tobacco  . Never Used     Counseling given: Not Answered   Past Medical History:  Diagnosis Date  . Cancer [facility])    Has Breast CA gene  . Heart murmur    Past Surgical History:  Procedure Laterality Date  . ABDOMINAL HYSTERECTOMY  2008  . BREAST BIOPSY Left    negative x2 done at same time  . BREAST CYST ASPIRATION Left    negative  . TONSILLECTOMY AND ADENOIDECTOMY    . TUBAL LIGATION     Family History  Problem Relation Age of Onset  . Hyperlipidemia Mother   . Hypertension Mother   . Cancer Daughter        Breast  . Breast cancer Daughter 56  . Cancer Paternal Aunt        Breast  . Cancer Maternal Grandmother        Breast  . Breast cancer Maternal Grandmother 67  . Breast cancer Sister 39  . Cancer Paternal Uncle        Prostate   History  Sexual Activity  . Sexual activity: No    Outpatient Encounter Prescriptions as of 07/09/2017  Medication Sig  . atorvastatin (LIPITOR) 20 MG tablet TAKE 1 TABLET(20 MG) BY MOUTH DAILY  . Calcium Carb-Cholecalciferol (CALCIUM 1000 + D PO) Take by mouth.  . esomeprazole (NEXIUM) 40 MG capsule TAKE 1 CAPSULE(40 MG) BY MOUTH DAILY  . hydrochlorothiazide (HYDRODIURIL) 12.5 MG tablet Take 1 tablet (12.5 mg total) by mouth daily.  Marland Kitchen lisinopril (PRINIVIL,ZESTRIL) 20 MG  tablet Take 1 tablet (20 mg total) by mouth daily.  . Multiple Vitamins-Minerals (MULTIVITAMIN ADULT PO) Take by mouth.   No facility-administered encounter medications on file as of 07/09/2017.     Activities of Daily Living In your present state of health, do you have any difficulty performing the following activities: 07/09/2017 07/09/2016  Hearing? N N  Vision? N N  Difficulty concentrating or making decisions? N N  Walking or climbing stairs? N Y  Comment - Intermittent L knee pain when walking. Uses knee brace when needed  Dressing or bathing? N N  Doing errands, shopping? N N  Preparing Food and eating ? N N  Using the Toilet? N N  In the past six months, have you accidently leaked urine? N N  Do you have problems with loss of bowel control? N N  Managing your Medications? N N  Managing your Finances? Y N  Comment Husband manages the finances -  Housekeeping or managing your Housekeeping? N N  Some recent data might be hidden    Patient Care Team: Leone Haven, MD as PCP - General (Family Medicine)    Assessment:  This is a routine wellness examination for [place]. The goal of the wellness visit is to assist the patient how to close the gaps in care and create a preventative care plan for the patient.   The roster of all physicians providing medical care to patient is listed in the Snapshot section of the chart.  Taking calcium as appropriate/Osteoporosis reviewed.    Safety issues reviewed; Smoke and carbon monoxide detectors in the home. No firearms in the home.  Wears seatbelts when driving or riding with others. Patient does wear sunscreen or protective clothing when in direct sunlight. No violence in the home.  Depression- PHQ 2 &9 complete.  No signs/symptoms or verbal communication regarding little pleasure in doing things, feeling down, depressed or hopeless. No changes in sleeping, energy, eating, concentrating.  No thoughts of self harm or harm  towards others.  Time spent on this topic is 8 minutes.   Patient is alert, normal appearance, oriented to person/place/and time.  Correctly identified the president of the [place], recall of 3/3 words, and performing simple calculations. Displays appropriate judgement and can read correct time from watch face.   No new identified risk were noted.  No failures at ADL's or IADL's.    BMI- discussed the importance of a healthy diet, water intake and the benefits of aerobic exercise. Educational material provided.   24 hour diet recall: Breakfast: oatmeal Lunch: she cannot recall Dinner: apple, cheese  Daily fluid intake: 2 cups of caffeine, 4 cups of water.  Dental- every 12 months.  [place].  Eye- Visual acuity not assessed per patient preference since they have regular follow up with the ophthalmologist.  Wears corrective lenses.  Sleep patterns- Sleeps 9 hours at night.  Wakes feeling rested.  High dose influenza vaccine administered L deltoid, tolerated well. Educational material provided.  Health maintenance gaps- closed.  Patient Concerns: None at this time. Follow up with PCP as needed.  Exercise Activities and Dietary recommendations Current Exercise Habits: Home exercise routine, Type of exercise: walking;calisthenics (swimming), Time (Minutes): 60, Frequency (Times/Week): 4, Weekly Exercise (Minutes/Week): 240, Intensity: Moderate  Goals    . Healthy Lifestyle          Stay hydrated Stay active Low carb foods    . Increase water intake      Fall Risk Fall Risk  07/09/2017 07/09/2016 11/30/2014  Falls in the past year? No No No   Depression Screen PHQ 2/9 Scores 07/09/2017 07/09/2016 11/30/2014  PHQ - 2 Score 0 0 0  PHQ- 9 Score 0 - -     Cognitive Function MMSE - Mini Mental State Exam 07/09/2017 07/09/2016  Orientation to time 5 5  Orientation to Place 5 5  Registration 3 3  Attention/ Calculation 5 5  Recall 3 3  Language- name 2 objects 2 2  Language-  repeat 1 1  Language- follow 3 step command 3 3  Language- read & follow direction 1 1  Write a sentence 1 1  Copy design 0 1  Copy design-comments difficulty copying the design -  Total score 29 30        Immunization History  Administered Date(s) Administered  . Influenza Split 06/23/2014  . Influenza, High Dose Seasonal PF 06/06/2016, 07/09/2017  . Influenza-Unspecified 07/06/2015  . Pneumococcal Conjugate-13 11/30/2014  . Pneumococcal Polysaccharide-23 07/09/2016   Screening Tests Health Maintenance  Topic Date Due  . MAMMOGRAM  07/30/2018  . COLONOSCOPY  09/23/2018  . TETANUS/TDAP  10/24/2024  . INFLUENZA VACCINE  Completed  . DEXA SCAN  Completed  . Hepatitis C Screening  Completed  . PNA vac Low Risk Adult  Completed      Plan:   End of life planning; Advanced aging; Advanced directives discussed.  No HCPOA/Living Will.  Additional information declined at this time.  I have personally reviewed and noted the following in the patient's chart:   . Medical and social history . Use of alcohol, tobacco or illicit drugs  . Current medications and supplements . Functional ability and status . Nutritional status . Physical activity . Advanced directives . List of other physicians . Hospitalizations, surgeries, and ER visits in previous 12 months . Vitals . Screenings to include cognitive, depression, and falls . Referrals and appointments  In addition, I have reviewed and discussed with patient certain preventive protocols, quality metrics, and best practice recommendations. A written personalized care plan for preventive services as well as general preventive health recommendations were provided to patient.     Varney Biles, LPN  [id number]

## 2017-07-11 ENCOUNTER — Telehealth: Payer: Self-pay | Admitting: Family Medicine

## 2017-07-11 MED ORDER — HYDROCHLOROTHIAZIDE 12.5 MG PO TABS: 12.5000 mg | ORAL_TABLET | Freq: Every day | ORAL | 1 refills | Status: DC

## 2017-07-11 NOTE — Telephone Encounter (Signed)
Please advise 

## 2017-07-11 NOTE — Telephone Encounter (Signed)
Pt insurance called [employer] regarding pt needing another Rx showing the increase of hydrochlorothiazide (HYDRODIURIL) 12.5 MG tablet per pt.   Pharmacy is [facility], [place] AT [address]  Call pt @ [phone].

## 2017-07-11 NOTE — Telephone Encounter (Signed)
Sent to pharmacy. Please contact her to confirm dosing.

## 2017-07-14 NOTE — Telephone Encounter (Signed)
Noted. Thanks.

## 2017-07-14 NOTE — Telephone Encounter (Signed)
Patient states she is taking 12.5 once a day, she states it was decreased from 25mg  at last visit but she has not had the new rx so she has not been taking it. Patient states she will go pick up new rx today.

## 2017-07-16 ENCOUNTER — Other Ambulatory Visit: Payer: Self-pay | Admitting: Family Medicine

## 2017-07-16 ENCOUNTER — Other Ambulatory Visit: Payer: Self-pay

## 2017-07-16 DIAGNOSIS — Z1231 Encounter for screening mammogram for malignant neoplasm of breast: Secondary | ICD-10-CM

## 2017-07-21 ENCOUNTER — Telehealth: Payer: Self-pay

## 2017-07-21 NOTE — Telephone Encounter (Signed)
Called patient in regards to hctz recall, patient states she has not taken her rx for the last 3 weeks because the pharmacy states it is too soon to fill. Called pharmacy and they did inform me that patient is filling too soon. Per telephone call in chart 07/14/17, patient verified dose was 12.5 mg but it had recently been decreased from 25 mg at last office visit. Pharmacy states she has always been on 12.5 mg. Per chart I am unable to find where patient was taking 25 mg.

## 2017-07-21 NOTE — Telephone Encounter (Signed)
Noted. This was sent to her pharmacy on the 19th. Is she not able to refill this with the new prescription? Also did she discuss the recall with her pharmacy to ensure that she got the appropriate medication?

## 2017-07-22 NOTE — Telephone Encounter (Signed)
Patient will get refill on 07/26/17

## 2017-07-28 MED ORDER — HYDROCHLOROTHIAZIDE 12.5 MG PO TABS: 12.5000 mg | ORAL_TABLET | Freq: Every day | ORAL | 1 refills | Status: DC

## 2017-07-28 NOTE — Addendum Note (Signed)
Addended by: Juanda Chance on: 07/28/2017 02:24 PM   Modules accepted: Orders

## 2017-07-28 NOTE — Telephone Encounter (Signed)
Tried calling pharmacy x 2, unable to speak to anyone. Resent rx to pharmacy

## 2017-07-28 NOTE — Telephone Encounter (Signed)
Pt came in and stated that [facility] didn't have a rx for her for the hydrochlorothiazide (HYDRODIURIL) 12.5 MG tablet Please advise

## 2017-07-31 ENCOUNTER — Ambulatory Visit
Admission: RE | Admit: 2017-07-31 | Discharge: 2017-07-31 | Disposition: A | Discharge status: Home / Self Care | Payer: Medicare Other | Source: Ambulatory Visit | Attending: Family Medicine | Admitting: Family Medicine

## 2017-07-31 DIAGNOSIS — Z1231 Encounter for screening mammogram for malignant neoplasm of breast: Secondary | ICD-10-CM | POA: Diagnosis not present

## 2017-08-01 ENCOUNTER — Other Ambulatory Visit: Payer: Self-pay | Admitting: Family Medicine

## 2017-08-01 DIAGNOSIS — Z189 Retained foreign body fragments, unspecified material: Principal | ICD-10-CM

## 2017-08-01 DIAGNOSIS — H44702 Unspecified retained (old) intraocular foreign body, nonmagnetic, left eye: Secondary | ICD-10-CM

## 2017-08-01 DIAGNOSIS — Z1509 Genetic susceptibility to other malignant neoplasm: Principal | ICD-10-CM

## 2017-08-01 DIAGNOSIS — Z1501 Genetic susceptibility to malignant neoplasm of breast: Secondary | ICD-10-CM

## 2017-08-19 ENCOUNTER — Ambulatory Visit: Payer: Medicare Other | Admitting: Family Medicine

## 2017-08-20 ENCOUNTER — Ambulatory Visit (INDEPENDENT_AMBULATORY_CARE_PROVIDER_SITE_OTHER): Payer: Medicare Other | Admitting: Family Medicine

## 2017-08-20 ENCOUNTER — Encounter: Payer: Self-pay | Admitting: Family Medicine

## 2017-08-20 VITALS — BP 130/76 | HR 67 | Temp 97.6°F | Wt 170.6 lb

## 2017-08-20 DIAGNOSIS — Z1501 Genetic susceptibility to malignant neoplasm of breast: Secondary | ICD-10-CM

## 2017-08-20 DIAGNOSIS — E785 Hyperlipidemia, unspecified: Secondary | ICD-10-CM

## 2017-08-20 DIAGNOSIS — I10 Essential (primary) hypertension: Secondary | ICD-10-CM | POA: Diagnosis not present

## 2017-08-20 DIAGNOSIS — Z1509 Genetic susceptibility to other malignant neoplasm: Secondary | ICD-10-CM | POA: Diagnosis not present

## 2017-08-20 LAB — LIPID PANEL
Cholesterol: 153 mg/dL (ref 0–200)
HDL: 47.3 mg/dL (ref >39.00)
LDL CALC: 73 mg/dL (ref 0–99)
NONHDL: 105.34
TRIGLYCERIDES: 160 mg/dL — AB (ref 0.0–149.0)
Total CHOL/HDL Ratio: 3
VLDL: 32 mg/dL (ref 0.0–40.0)

## 2017-08-20 LAB — COMPREHENSIVE METABOLIC PANEL
ALT: 15 U/L (ref 0–35)
AST: 13 U/L (ref 0–37)
Albumin: 4.1 g/dL (ref 3.5–5.2)
Alkaline Phosphatase: 56 U/L (ref 39–117)
BILIRUBIN TOTAL: 0.4 mg/dL (ref 0.2–1.2)
BUN: 22 mg/dL (ref 6–23)
CALCIUM: 9.7 mg/dL (ref 8.4–10.5)
CO2: 30 mEq/L (ref 19–32)
Chloride: 106 mEq/L (ref 96–112)
Creatinine, Ser: 0.75 mg/dL (ref 0.40–1.20)
GFR: 81.74 mL/min (ref >60.00)
Glucose, Bld: 111 mg/dL — ABNORMAL HIGH (ref 70–99)
POTASSIUM: 4.5 meq/L (ref 3.5–5.1)
Sodium: 142 mEq/L (ref 135–145)
Total Protein: 6.9 g/dL (ref 6.0–8.3)

## 2017-08-20 MED ORDER — HYDROCHLOROTHIAZIDE 25 MG PO TABS: 25.0000 mg | ORAL_TABLET | Freq: Every day | ORAL | 3 refills | Status: DC

## 2017-08-20 NOTE — Assessment & Plan Note (Signed)
Continue Lipitor.  Check labs.

## 2017-08-20 NOTE — Progress Notes (Signed)
  [name], MD Phone: [phone]  Deanna Ross is a 66 y.o. female who presents today for follow-up.  Hypertension: Running between 130s-150s/70s.  Taking hydrochlorthiazide 12.5 mg.  Taking lisinopril.  No chest pain, shortness breath, or edema.  Hyperlipidemia: Taking Lipitor.  No right upper quadrant pain or myalgias.  BRCA2 positive: Does have a family history of breast cancer in her daughter, aunt, and grandmother.  She has no personal history of breast cancer.  She underwent genetic counseling with Dr. Grayland Ormond on 08/27/16.  He recommended continued yearly mammograms.  Based on his note he did not think MRI screening would give any additional benefit at this time as long as her mammograms continue to be completed yearly and remain normal.  Social History   Tobacco Use  Smoking Status Never Smoker  Smokeless Tobacco Never Used     ROS see history of present illness  Objective  Physical Exam Vitals:   08/20/17 0909  BP: 130/76  Pulse: 67  Temp: 97.6 F (36.4 C)  SpO2: 98%    BP Readings from Last 3 Encounters:  08/20/17 130/76  07/09/17 136/78  03/12/17 (!) 152/78   Wt Readings from Last 3 Encounters:  08/20/17 170 lb 9.6 oz (77.4 kg)  07/09/17 169 lb 12.8 oz (77 kg)  02/03/17 170 lb 9.6 oz (77.4 kg)    Physical Exam  Constitutional: No distress.  Cardiovascular: Normal rate, regular rhythm and normal heart sounds.  Pulmonary/Chest: Effort normal and breath sounds normal.  Musculoskeletal: She exhibits no edema.  Neurological: She is alert. Gait normal.  Skin: Skin is warm and dry. She is not diaphoretic.  Patient deferred breast exam.  Assessment/Plan: Please see individual problem list.  HTN (hypertension) Slightly above goal.  We will increase hydrochlorothiazide to 25 mg.  She will take two 12.5 mg tablets daily until she runs out.  Lab work today and in 1 month.  BP check in 1 month.  BRCA2 positive Patient saw oncology for genetics  counseling.  They recommended please see their note for recommendations.  It appears they felt MRI screening would not give any additional benefit and thus we will defer this.  Continue yearly mammograms.  Patient deferred breast exam.  Hyperlipidemia Continue Lipitor.  Check labs.   Deanna Ross was seen today for follow-up.  Diagnoses and all orders for this visit:  Essential hypertension -     Basic metabolic panel; Future -     Cancel: Basic Metabolic Panel (BMET)  Hyperlipidemia, unspecified hyperlipidemia type -     Lipid Profile -     Comp Met (CMET)  BRCA2 positive  Other orders -     hydrochlorothiazide (HYDRODIURIL) 25 MG tablet; Take 1 tablet (25 mg total) by mouth daily.    Orders Placed This Encounter  Procedures  . Basic metabolic panel    Standing Status:   Future    Standing Expiration Date:   08/20/2018  . Lipid Profile  . Comp Met (CMET)    Meds ordered this encounter  Medications  . hydrochlorothiazide (HYDRODIURIL) 25 MG tablet    Sig: Take 1 tablet (25 mg total) by mouth daily.    Dispense:  90 tablet    Refill:  [place], MD [place]

## 2017-08-20 NOTE — Addendum Note (Signed)
Addended by: Leone Haven on: 08/20/2017 09:26 AM   Modules accepted: Orders

## 2017-08-20 NOTE — Addendum Note (Signed)
Addended by: Leone Haven on: 08/20/2017 09:27 AM   Modules accepted: Orders

## 2017-08-20 NOTE — Assessment & Plan Note (Signed)
Slightly above goal.  We will increase hydrochlorothiazide to 25 mg.  She will take two 12.5 mg tablets daily until she runs out.  Lab work today and in 1 month.  BP check in 1 month.

## 2017-08-20 NOTE — Patient Instructions (Signed)
Nice to see you. We will check lab work today and contact you with the results. 

## 2017-08-20 NOTE — Assessment & Plan Note (Signed)
Patient saw oncology for genetics counseling.  They recommended please see their note for recommendations.  It appears they felt MRI screening would not give any additional benefit and thus we will defer this.  Continue yearly mammograms.  Patient deferred breast exam.

## 2017-08-21 DIAGNOSIS — H2513 Age-related nuclear cataract, bilateral: Secondary | ICD-10-CM | POA: Diagnosis not present

## 2017-08-21 DIAGNOSIS — H40003 Preglaucoma, unspecified, bilateral: Secondary | ICD-10-CM | POA: Diagnosis not present

## 2017-08-23 ENCOUNTER — Other Ambulatory Visit: Payer: Self-pay | Admitting: Family Medicine

## 2017-08-23 DIAGNOSIS — R7309 Other abnormal glucose: Secondary | ICD-10-CM

## 2017-09-05 ENCOUNTER — Other Ambulatory Visit: Payer: Self-pay | Admitting: Family Medicine

## 2017-09-18 ENCOUNTER — Ambulatory Visit (INDEPENDENT_AMBULATORY_CARE_PROVIDER_SITE_OTHER): Payer: Medicare Other | Admitting: *Deleted

## 2017-09-18 ENCOUNTER — Encounter: Payer: Self-pay | Admitting: *Deleted

## 2017-09-18 ENCOUNTER — Other Ambulatory Visit (INDEPENDENT_AMBULATORY_CARE_PROVIDER_SITE_OTHER): Payer: Medicare Other

## 2017-09-18 VITALS — BP 134/82 | HR 66 | Resp 18

## 2017-09-18 DIAGNOSIS — R7309 Other abnormal glucose: Secondary | ICD-10-CM

## 2017-09-18 DIAGNOSIS — I10 Essential (primary) hypertension: Secondary | ICD-10-CM

## 2017-09-18 LAB — BASIC METABOLIC PANEL
BUN: 20 mg/dL (ref 6–23)
CHLORIDE: 103 meq/L (ref 96–112)
CO2: 28 meq/L (ref 19–32)
Calcium: 9.3 mg/dL (ref 8.4–10.5)
Creatinine, Ser: 0.77 mg/dL (ref 0.40–1.20)
GFR: 79.27 mL/min (ref >60.00)
Glucose, Bld: 107 mg/dL — ABNORMAL HIGH (ref 70–99)
POTASSIUM: 4 meq/L (ref 3.5–5.1)
Sodium: 139 mEq/L (ref 135–145)

## 2017-09-18 LAB — HEMOGLOBIN A1C: Hgb A1c MFr Bld: 5.9 % (ref 4.6–6.5)

## 2017-09-18 NOTE — Progress Notes (Signed)
The patient's blood pressure is relatively stable.  Please find out if she has been checking it at home.  If she has not she needs to start.  If she has any end it is similar to today we could consider adding an additional medication.  Thanks.

## 2017-09-18 NOTE — Progress Notes (Signed)
Last OV 08/20/17 advised to follow up in one month for BP check, after increasing HCTZ from 12.5 mg daily to 25 mg daily.  Patient BP taken in left arm 138/94 pulse 70, patient was allowed to rest an additional 15 minutes BP retaken left arm 134/82 pulse 66.

## 2017-09-19 ENCOUNTER — Other Ambulatory Visit: Payer: Medicare Other

## 2017-09-19 NOTE — Progress Notes (Signed)
Patient staed she does check BP at home and usually her systolic runs around 77 and diagstolic is usually 309 to 134, patient said she will continue to monitor and if systolic elevates will call office.

## 2017-12-24 DIAGNOSIS — L821 Other seborrheic keratosis: Secondary | ICD-10-CM | POA: Diagnosis not present

## 2017-12-24 DIAGNOSIS — Z85828 Personal history of other malignant neoplasm of skin: Secondary | ICD-10-CM | POA: Diagnosis not present

## 2017-12-24 DIAGNOSIS — D485 Neoplasm of uncertain behavior of skin: Secondary | ICD-10-CM | POA: Diagnosis not present

## 2017-12-24 DIAGNOSIS — Z1283 Encounter for screening for malignant neoplasm of skin: Secondary | ICD-10-CM | POA: Diagnosis not present

## 2017-12-24 DIAGNOSIS — L820 Inflamed seborrheic keratosis: Secondary | ICD-10-CM | POA: Diagnosis not present

## 2018-02-01 ENCOUNTER — Other Ambulatory Visit: Payer: Self-pay | Admitting: Family Medicine

## 2018-02-18 ENCOUNTER — Encounter: Payer: Self-pay | Admitting: Family Medicine

## 2018-02-18 ENCOUNTER — Ambulatory Visit (INDEPENDENT_AMBULATORY_CARE_PROVIDER_SITE_OTHER): Payer: Medicare Other | Admitting: Family Medicine

## 2018-02-18 VITALS — BP 128/70 | HR 66 | Temp 97.6°F | Ht 65.0 in | Wt 164.4 lb

## 2018-02-18 DIAGNOSIS — E785 Hyperlipidemia, unspecified: Secondary | ICD-10-CM | POA: Diagnosis not present

## 2018-02-18 DIAGNOSIS — R7303 Prediabetes: Secondary | ICD-10-CM | POA: Diagnosis not present

## 2018-02-18 DIAGNOSIS — K219 Gastro-esophageal reflux disease without esophagitis: Secondary | ICD-10-CM

## 2018-02-18 DIAGNOSIS — L821 Other seborrheic keratosis: Secondary | ICD-10-CM | POA: Insufficient documentation

## 2018-02-18 DIAGNOSIS — R252 Cramp and spasm: Secondary | ICD-10-CM

## 2018-02-18 DIAGNOSIS — I10 Essential (primary) hypertension: Secondary | ICD-10-CM | POA: Diagnosis not present

## 2018-02-18 LAB — COMPREHENSIVE METABOLIC PANEL
ALT: 18 U/L (ref 0–35)
AST: 17 U/L (ref 0–37)
Albumin: 4.3 g/dL (ref 3.5–5.2)
Alkaline Phosphatase: 51 U/L (ref 39–117)
BILIRUBIN TOTAL: 0.6 mg/dL (ref 0.2–1.2)
BUN: 16 mg/dL (ref 6–23)
CHLORIDE: 100 meq/L (ref 96–112)
CO2: 30 meq/L (ref 19–32)
CREATININE: 0.74 mg/dL (ref 0.40–1.20)
Calcium: 9.6 mg/dL (ref 8.4–10.5)
GFR: 82.89 mL/min (ref >60.00)
GLUCOSE: 106 mg/dL — AB (ref 70–99)
Potassium: 4.2 mEq/L (ref 3.5–5.1)
Sodium: 136 mEq/L (ref 135–145)
Total Protein: 7.3 g/dL (ref 6.0–8.3)

## 2018-02-18 LAB — HEMOGLOBIN A1C: Hgb A1c MFr Bld: 5.7 % (ref 4.6–6.5)

## 2018-02-18 NOTE — Assessment & Plan Note (Signed)
-  Continue Lipitor °

## 2018-02-18 NOTE — Patient Instructions (Signed)
Nice to see you. We will check lab work today and contact you with the results. Please continue to stay active. 

## 2018-02-18 NOTE — Assessment & Plan Note (Signed)
Adequately controlled.  Continue Nexium.

## 2018-02-18 NOTE — Progress Notes (Signed)
  [name], MD Phone: [phone]  Deanna Ross is a 67 y.o. female who presents today for f/u.  CC: htn, hld, gerd  HYPERTENSION  Disease Monitoring  Home BP Monitoring similar to today Chest pain- no    Dyspnea- no Medications  Compliance-  Taking HCTZ, lisinopril.  Edema- no  HYPERLIPIDEMIA Symptoms Chest pain on exertion:  no   Medications: Compliance- taking lipitor Right upper quadrant pain- no  Muscle aches- no  GERD:   Reflux symptoms: none if she takes nexium, if she misses it cucumbers and strawberries set her off   Abd pain: no   Blood in stool: no  Dysphagia: no   EGD: in the past, no abnormalities per patient report  Medication: nexium  She recently saw dermatology and had 2 spots removed from her face that she reports he was not worried about.  She was worried about something on her left anterior shoulder though she was advised this is nothing to worry about.  She has been exercising by going to the pool and walking her dog.  Reports having a single charley horse a few nights ago that had resolved quickly.  She wonders about taking potassium for this.  She reports she drinks 3 bottles of water daily.   Social History   Tobacco Use  Smoking Status Never Smoker  Smokeless Tobacco Never Used     ROS see history of present illness  Objective  Physical Exam Vitals:   02/18/18 0823  BP: 128/70  Pulse: 66  Temp: 97.6 F (36.4 C)  SpO2: 96%    BP Readings from Last 3 Encounters:  02/18/18 128/70  09/18/17 134/82  08/20/17 130/76   Wt Readings from Last 3 Encounters:  02/18/18 164 lb 6.4 oz (74.6 kg)  08/20/17 170 lb 9.6 oz (77.4 kg)  07/09/17 169 lb 12.8 oz (77 kg)    Physical Exam  Constitutional: No distress.  Cardiovascular: Normal rate, regular rhythm and normal heart sounds.  Pulmonary/Chest: Effort normal and breath sounds normal.  Musculoskeletal: She exhibits no edema.  Neurological: She is alert.  Skin: Skin is warm  and dry. She is not diaphoretic.        Assessment/Plan: Please see individual problem list.  HTN (hypertension) Well-controlled.  Continue current regimen.  Check BMP.  GERD (gastroesophageal reflux disease) Adequately controlled.  Continue Nexium.  Hyperlipidemia Continue Lipitor.  Seborrheic keratosis Benign seborrheic keratosis and benign skin cyst noted on exam.  She will continue to see dermatology.  Muscle cramp Discussed adequate fluid intake.  Advised stretching.  She can take yellow mustard for this.  We will check electrolytes.   Orders Placed This Encounter  Procedures  . Comp Met (CMET)  . HgB A1c    No orders of the defined types were placed in this encounter.    [name], MD [place]

## 2018-02-18 NOTE — Assessment & Plan Note (Signed)
Benign seborrheic keratosis and benign skin cyst noted on exam.  She will continue to see dermatology.

## 2018-02-18 NOTE — Assessment & Plan Note (Signed)
Discussed adequate fluid intake.  Advised stretching.  She can take yellow mustard for this.  We will check electrolytes.

## 2018-02-18 NOTE — Assessment & Plan Note (Signed)
Well-controlled.  Continue current regimen.  Check BMP. 

## 2018-03-27 ENCOUNTER — Encounter: Payer: Self-pay | Admitting: Family Medicine

## 2018-03-27 ENCOUNTER — Ambulatory Visit (INDEPENDENT_AMBULATORY_CARE_PROVIDER_SITE_OTHER): Payer: Medicare Other | Admitting: Family Medicine

## 2018-03-27 VITALS — BP 116/72 | HR 70 | Temp 98.0°F | Resp 16 | Ht 65.0 in | Wt 164.2 lb

## 2018-03-27 DIAGNOSIS — L729 Follicular cyst of the skin and subcutaneous tissue, unspecified: Secondary | ICD-10-CM

## 2018-03-27 MED ORDER — DOXYCYCLINE HYCLATE 100 MG PO TABS: 100.0000 mg | ORAL_TABLET | Freq: Two times a day (BID) | ORAL | 0 refills | Status: DC

## 2018-03-27 NOTE — Patient Instructions (Signed)
Nice to see you. We are going to place you on an antibiotic.  Please avoid excessive sun exposure on this antibiotic.  Please use warm compresses on the skin lesion.  If it enlarges or you develop fevers or feel ill please be evaluated.

## 2018-03-28 DIAGNOSIS — L729 Follicular cyst of the skin and subcutaneous tissue, unspecified: Secondary | ICD-10-CM

## 2018-03-28 HISTORY — DX: Follicular cyst of the skin and subcutaneous tissue, unspecified: L72.9

## 2018-03-28 NOTE — Progress Notes (Signed)
  [name], MD Phone: [phone]  Deanna Ross is a 67 y.o. female who presents today for same day visit.  Skin nodule: Patient reports 9 days ago she noticed a nodule in the midportion of her right trapezius.  It was erythematous to start with.  It progressively enlarged though has potentially slightly decreased in size.  The redness has improved though it continues to hurt.  She feels as though it has not decreased in size though her husband thinks it may have.  It has not drained.  She has tried warm compresses.  He has not had fevers though the area has been warm.  Social History   Tobacco Use  Smoking Status Never Smoker  Smokeless Tobacco Never Used     ROS see history of present illness  Objective  Physical Exam Vitals:   03/27/18 1517  BP: 116/72  Pulse: 70  Resp: 16  Temp: 98 F (36.7 C)  SpO2: 94%    BP Readings from Last 3 Encounters:  03/27/18 116/72  02/18/18 128/70  09/18/17 134/82   Wt Readings from Last 3 Encounters:  03/27/18 164 lb 3.2 oz (74.5 kg)  02/18/18 164 lb 6.4 oz (74.6 kg)  08/20/17 170 lb 9.6 oz (77.4 kg)    Physical Exam  Constitutional: No distress.  Cardiovascular: Normal rate, regular rhythm and normal heart sounds.  Pulmonary/Chest: Effort normal and breath sounds normal.  Neurological: She is alert.  Skin: Skin is warm and dry. She is not diaphoretic.        Assessment/Plan: Please see individual problem list.  Skin cyst Concern for infected skin cyst versus phlegmon.  No fluctuance to indicate abscess.  Discussed warm compresses.  Will place on doxycycline.  We will recheck early next week.  If the area does not resolve with antibiotics she will likely need to see a surgeon to consider removal.    Orders Placed This Encounter  Procedures  . Ambulatory referral to General Surgery    Referral Priority:   Routine    Referral Type:   Surgical    Referral Reason:   Specialty Services Required    Requested  Specialty:   General Surgery    Number of Visits Requested:   1    Meds ordered this encounter  Medications  . doxycycline (VIBRA-TABS) 100 MG tablet    Sig: Take 1 tablet (100 mg total) by mouth 2 (two) times daily.    Dispense:  14 tablet    Refill:  0     [name], MD [place]

## 2018-03-28 NOTE — Assessment & Plan Note (Signed)
Concern for infected skin cyst versus phlegmon.  No fluctuance to indicate abscess.  Discussed warm compresses.  Will place on doxycycline.  We will recheck early next week.  If the area does not resolve with antibiotics she will likely need to see a surgeon to consider removal.

## 2018-03-29 ENCOUNTER — Other Ambulatory Visit: Payer: Self-pay | Admitting: Family Medicine

## 2018-04-03 ENCOUNTER — Ambulatory Visit: Payer: Medicare Other | Admitting: Family Medicine

## 2018-04-07 ENCOUNTER — Ambulatory Visit (INDEPENDENT_AMBULATORY_CARE_PROVIDER_SITE_OTHER): Payer: Medicare Other | Admitting: Surgery

## 2018-04-07 ENCOUNTER — Ambulatory Visit (INDEPENDENT_AMBULATORY_CARE_PROVIDER_SITE_OTHER): Payer: Medicare Other | Admitting: Family Medicine

## 2018-04-07 ENCOUNTER — Encounter: Payer: Self-pay | Admitting: Surgery

## 2018-04-07 ENCOUNTER — Encounter: Payer: Self-pay | Admitting: Family Medicine

## 2018-04-07 VITALS — BP 131/74 | HR 87 | Temp 97.8°F | Ht 65.0 in | Wt 164.2 lb

## 2018-04-07 DIAGNOSIS — L729 Follicular cyst of the skin and subcutaneous tissue, unspecified: Secondary | ICD-10-CM

## 2018-04-07 DIAGNOSIS — L723 Sebaceous cyst: Secondary | ICD-10-CM | POA: Diagnosis not present

## 2018-04-07 NOTE — Patient Instructions (Signed)
Nice to see you. Please see the surgeon this afternoon for evaluation to consider removal of the likely cyst.

## 2018-04-07 NOTE — Progress Notes (Signed)
  [name], MD Phone: [phone]  Deanna Ross is a 67 y.o. female who presents today for f/u.  CC: skin cyst  Patient seen previously for potentially inflamed or infected skin cyst.  She finished the antibiotic.  The pain is slightly better.  The lesion is slightly smaller.  Does still bother her.  No fevers.  She sees the surgeon today to consider removal.  Social History   Tobacco Use  Smoking Status Never Smoker  Smokeless Tobacco Never Used     ROS see history of present illness  Objective  Physical Exam Vitals:   04/07/18 1213  BP: 124/80  Pulse: 75  Temp: 98.4 F (36.9 C)  SpO2: 95%    BP Readings from Last 3 Encounters:  04/07/18 124/80  03/27/18 116/72  02/18/18 128/70   Wt Readings from Last 3 Encounters:  04/07/18 163 lb 3.2 oz (74 kg)  03/27/18 164 lb 3.2 oz (74.5 kg)  02/18/18 164 lb 6.4 oz (74.6 kg)    Physical Exam  Constitutional: No distress.  Pulmonary/Chest: Effort normal.  Skin: She is not diaphoretic.        Assessment/Plan: Please see individual problem list.  Skin cyst Slightly improved though remains bothersome.  She will see the general surgeon today for evaluation.   No orders of the defined types were placed in this encounter.   No orders of the defined types were placed in this encounter.    [name], MD [place]

## 2018-04-07 NOTE — Progress Notes (Signed)
Surgical Clinic History and Physical  Referring provider:  Leone Haven, MD [address] [place], [place] [address]  HISTORY OF PRESENT ILLNESS (HPI):  67 y.o. female presents for evaluation of Right posterior shoulder mass. Patient reports she first noticed drainage from the mass 3 weeks ago, for which she presented to her primary care physician, who prescribed for the patient a course of oral antibiotics. Patient describes the size of the mass has since decreased and the drainage has ceased, but she states it's been bothering her and causing her pain ever since she became aware of the mass, and she requests to have it removed accordingly. Patient otherwise denies any other similar masses elsewhere and likewise denies fever/chills, N/V, CP, or SOB.  PAST MEDICAL HISTORY (PMH):  Past Medical History:  Diagnosis Date  . Cancer [facility])    Has Breast CA gene  . Heart murmur      PAST SURGICAL HISTORY [facility]):  Past Surgical History:  Procedure Laterality Date  . ABDOMINAL HYSTERECTOMY  2008  . BREAST BIOPSY Left    negative x2 done at same time  . BREAST CYST ASPIRATION Left    negative  . TONSILLECTOMY AND ADENOIDECTOMY    . TUBAL LIGATION       MEDICATIONS:  Prior to Admission medications   Medication Sig Start Date End Date Taking? Authorizing Provider  atorvastatin (LIPITOR) 20 MG tablet TAKE 1 TABLET(20 MG) BY MOUTH DAILY 03/31/18  Yes Leone Haven, MD  Calcium Carb-Cholecalciferol (CALCIUM 1000 + D PO) Take by mouth.   Yes [provider]  esomeprazole (NEXIUM) 40 MG capsule TAKE 1 CAPSULE(40 MG) BY MOUTH DAILY 06/06/16  Yes Leone Haven, MD  hydrochlorothiazide (HYDRODIURIL) 25 MG tablet Take 1 tablet (25 mg total) by mouth daily. 08/20/17  Yes Leone Haven, MD  lisinopril (PRINIVIL,ZESTRIL) 20 MG tablet TAKE 1 TABLET(20 MG) BY MOUTH DAILY 02/03/18  Yes Leone Haven, MD  Multiple Vitamins-Minerals (MULTIVITAMIN ADULT PO) Take by  mouth.   Yes [provider]     ALLERGIES:  Allergies  Allergen Reactions  . Gadolinium Derivatives Hives and Itching    Itching in throat, hives, headache (on 01/31/14) WILL NEED 13-HOUR PREP IN FUTURE  . Iodine Hives  . Tdap [Tetanus-Diphth-Acell Pertussis] Swelling     SOCIAL HISTORY:  Social History   Socioeconomic History  . Marital status: Married    Spouse name: Not on file  . Number of children: Not on file  . Years of education: Not on file  . Highest education level: Not on file  Occupational History  . Not on file  Social Needs  . Financial resource strain: Not on file  . Food insecurity:    Worry: Not on file    Inability: Not on file  . Transportation needs:    Medical: Not on file    Non-medical: Not on file  Tobacco Use  . Smoking status: Never Smoker  . Smokeless tobacco: Never Used  Substance and Sexual Activity  . Alcohol use: No    Alcohol/week: 0.0 oz  . Drug use: No  . Sexual activity: Never    Partners: Male  Lifestyle  . Physical activity:    Days per week: Not on file    Minutes per session: Not on file  . Stress: Not on file  Relationships  . Social connections:    Talks on phone: Not on file    Gets together: Not on file  Attends religious service: Not on file    Active member of club or organization: Not on file    Attends meetings of clubs or organizations: Not on file    Relationship status: Not on file  . Intimate partner violence:    Fear of current or ex partner: Not on file    Emotionally abused: Not on file    Physically abused: Not on file    Forced sexual activity: Not on file  Other Topics Concern  . Not on file  Social History Narrative   Retired 4 years ago from [employer] with husband   3 daughters (66, 92, 47)    28 Grandchildren and 1 great grandchild    Enjoys swimming   2 cats and 1 dog- live inside the house     The patient currently resides (home / rehab facility / nursing home):  Home The patient normally is (ambulatory / bedbound): Ambulatory  FAMILY HISTORY:  Family History  Problem Relation Age of Onset  . Hyperlipidemia Mother   . Hypertension Mother   . Cancer Daughter        Breast  . Breast cancer Daughter 42  . Breast cancer Paternal Aunt   . Breast cancer Maternal Grandmother 24  . Breast cancer Sister 37  . Cancer Paternal Uncle        Prostate  . BRCA 1/2 Other        pt states she is BRCA 2 positive    Otherwise negative/non-contributory.  REVIEW OF SYSTEMS:  Constitutional: denies any other weight loss, fever, chills, or sweats  Eyes: denies any other vision changes, history of eye injury  ENT: denies sore throat, hearing problems  Respiratory: denies shortness of breath, wheezing  Cardiovascular: denies chest pain, palpitations  Gastrointestinal: denies abdominal pain, N/V, or diarrhea Musculoskeletal: denies any other joint pains or cramps  Skin: Denies any other rashes or skin discolorations except as per HPI Neurological: denies any other headache, dizziness, weakness  Psychiatric: Denies any other depression, anxiety   All other review of systems were otherwise negative   VITAL SIGNS:  BP 131/74   Pulse 87   Temp 97.8 F (36.6 C) (Oral)   Ht '5\' 5"'  (1.651 m)   Wt 164 lb 3.2 oz (74.5 kg)   BMI 27.32 kg/m    PHYSICAL EXAM:  Constitutional:  -- Normal body habitus  -- Awake, alert, and oriented x3  Eyes:  -- Pupils equally round and reactive to light  -- No scleral icterus  Ear, nose, throat:  -- No jugular venous distension -- No nasal drainage, bleeding Pulmonary:  -- No crackles  -- Equal breath sounds bilaterally -- Breathing non-labored at rest Cardiovascular:  -- S1, S2 present  -- No pericardial rubs  Gastrointestinal:  -- Abdomen soft, nontender, non-distended, no guarding/rebound  -- No abdominal masses appreciated, pulsatile or otherwise  Musculoskeletal and Integumentary:  -- Wounds or skin  discoloration: 5 - 6 mm mildly tender Right posterior shoulder subcutaneous mass with no surrounding erythema or drainage from visible sinus -- Extremities: B/L UE and LE FROM, hands and feet warm, no edema  Neurologic:  -- Motor function: Intact and symmetric -- Sensation: Intact and symmetric  Labs:  CBC Latest Ref Rng & Units 02/03/2017 11/30/2015 11/30/2014  WBC 4.0 - 10.5 K/uL 6.6 7.6 7.5  Hemoglobin 12.0 - 15.0 g/dL 13.6 13.5 14.0  Hematocrit 36.0 - 46.0 % 41.2 40.0 41.6  Platelets 150.0 - 400.0 K/uL 229.0  244.0 243.0   CMP Latest Ref Rng & Units 02/18/2018 09/18/2017 08/20/2017  Glucose 70 - 99 mg/dL 106(H) 107(H) 111(H)  BUN 6 - 23 mg/dL '16 20 22  ' Creatinine 0.40 - 1.20 mg/dL 0.74 0.77 0.75  Sodium 135 - 145 mEq/L 136 139 142  Potassium 3.5 - 5.1 mEq/L 4.2 4.0 4.5  Chloride 96 - 112 mEq/L 100 103 106  CO2 19 - 32 mEq/L '30 28 30  ' Calcium 8.4 - 10.5 mg/dL 9.6 9.3 9.7  Total Protein 6.0 - 8.3 g/dL 7.3 - 6.9  Total Bilirubin 0.2 - 1.2 mg/dL 0.6 - 0.4  Alkaline Phos 39 - 117 U/L 51 - 56  AST 0 - 37 U/L 17 - 13  ALT 0 - 35 U/L 18 - 15    Imaging studies: No new pertinent imaging studies available for review   Assessment/Plan:  67 y.o. female with a small but symptomatic Right posterior shoulder subcutaneous mass (most likely a sebaceous cyst) s/p recent course of oral antibiotics for what sounds like infection of the cyst, complicated by co-morbidities including HTN, hypercholesterolemia, GERD, and BRCA-2+.   - OTC pain control as needed  - all risks, benefits, and alternatives to excision of Right posterior shoulder subcutaneous mass (likely sebaceous cyst) were discussed with the patient, all of her questions were answered to her expressed satisfaction, patient expresses she wishes to proceed, and informed consent was obtained.  - will plan for in-office excision of small Right posterior shoulder sebaceous cyst on Tuesday, 7/30 at 11 am  - anticipate return to clinic 2 weeks after  above planned procedure  - instructed to call if any questions or concerns  All of the above recommendations were discussed with the patient, and all of patient's questions were answered to her expressed satisfaction.  Thank you for the opportunity to participate in this patient's care.  -- Marilynne Drivers Rosana Hoes, MD, [facility]: [facility] - Partnering for exceptional care. Office: [phone]

## 2018-04-07 NOTE — Patient Instructions (Signed)
We will see you in two weeks to do your procedure.

## 2018-04-07 NOTE — Assessment & Plan Note (Signed)
Slightly improved though remains bothersome.  She will see the general surgeon today for evaluation.

## 2018-04-17 ENCOUNTER — Ambulatory Visit: Payer: Medicare Other | Admitting: Family Medicine

## 2018-04-21 ENCOUNTER — Encounter: Payer: Self-pay | Admitting: Surgery

## 2018-04-21 ENCOUNTER — Ambulatory Visit (INDEPENDENT_AMBULATORY_CARE_PROVIDER_SITE_OTHER): Payer: Medicare Other | Admitting: Surgery

## 2018-04-21 VITALS — BP 112/66 | HR 71 | Temp 97.7°F | Ht 65.0 in | Wt 162.4 lb

## 2018-04-21 DIAGNOSIS — L723 Sebaceous cyst: Secondary | ICD-10-CM | POA: Diagnosis not present

## 2018-04-21 NOTE — Procedures (Signed)
SURGICAL OPERATIVE REPORT  DATE OF PROCEDURE: 04/21/2018  ATTENDING: Corene Cornea E. Rosana Hoes, MD  ANESTHESIA: Local  PRE-OPERATIVE DIAGNOSIS: Symptomatic (painful, recently infected) sebaceous cyst of the Right posterior shoulder (icd-10: L72.3)   POST-OPERATIVE DIAGNOSIS: Symptomatic (painful, recently infected) sebaceous cyst of the Right posterior shoulder (icd-10: L72.3)   PROCEDURE(S):  1.) Excision of symptomatic (painful, recently infected) sebaceous cyst of the Right posterior shoulder (cpt: 54562)   INTRAOPERATIVE FINDINGS: 1.2 cm x 1.2 cm currently non-infected subcutaneous mass of the Right posterior shoulder (most likely a sebaceous cyst), removed intact  INTRAVENOUS FLUIDS: 0 mL crystalloid   ESTIMATED BLOOD LOSS: Minimal (< 20 mL)  URINE OUTPUT: No Foley  SPECIMENS: 1.2 cm x 1.2 cm currently non-infected subcutaneous mass of the Right posterior shoulder (most likely a sebaceous cyst), removed intact  IMPLANTS: None  DRAINS: None  COMPLICATIONS: None apparent  CONDITION AT END OF PROCEDURE: Hemodynamically stable and awake  DISPOSITION OF PATIENT: PACU  INDICATIONS FOR PROCEDURE:  Patient is a 67 y.o. female who presented for a chronic increasingly symptomatic (painful and recently infected)  sebaceous cyst of the Right posterior shoulder. Patient reports it's been present/growing for years and recently has become increasingly painful and become infected. The infection has since resolved, and patient requested that it be removed so her activities can be performed with less discomfort. Patient was referred accordingly for surgical evaluation and management. All risks, benefits, and alternatives to above procedure were discussed with the patient, all of patient's questions were answered to her expressed satisfaction, and informed consent was obtained and documented.   DETAILS OF PROCEDURE: Patient was brought to the procedure suite and was appropriately identified. Laying on  the procedural table in prone position, operative site was prepped and draped in the usual sterile fashion, and following a brief time out, local anesthetic was injected and allowed to become effective, after which a 2 cm long and 1 cm wide transverse elliptical incision was made using a #15 blade scalpel, and incision was extended deep around subcutaneous mass using sharp + blunt dissection and direct pressure for hemostasis. During the course of dissecting free the cyst, it was not disrupted and was removed intact. Direct pressure was again applied for hemostasis, and further examination did not reveal any additional cyst. The wound was then irrigated with unused local anesthetic. Deep tissue and dermis were re-approximated using buried interrupted 3-0 Vicryl suture, skin was cleaned and dried, and sterile Dermabond skin glue was applied to the wound and allowed to dry.  I was present for all aspects of the above procedure, and no operative complications were apparent.

## 2018-04-21 NOTE — Patient Instructions (Signed)
Please do not shower for 48 hours.  If you have any pain, please take Ibuprofen or Motrin.  We will see you back in 2 weeks to make sure that you are doing well.  Please give [place] a call in case you have any questions or concerns.

## 2018-04-22 ENCOUNTER — Other Ambulatory Visit: Payer: Self-pay | Admitting: Surgery

## 2018-04-22 DIAGNOSIS — L723 Sebaceous cyst: Secondary | ICD-10-CM | POA: Diagnosis not present

## 2018-04-22 NOTE — Addendum Note (Signed)
Addended by: Wayna Chalet on: 04/22/2018 08:58 AM   Modules accepted: Orders

## 2018-04-27 LAB — PATHOLOGY

## 2018-04-28 ENCOUNTER — Telehealth: Payer: Self-pay

## 2018-04-28 NOTE — Telephone Encounter (Signed)
Called patient to let her know that her pathology report came back and it showed ruptured epidermoid cyst-benign. Patient was happy to hear and had no further questions. Reminded patient that she has a follow up appointment next week. She stated that she would attend.

## 2018-05-03 ENCOUNTER — Encounter: Payer: Self-pay | Admitting: Emergency Medicine

## 2018-05-03 ENCOUNTER — Emergency Department: Payer: Medicare Other

## 2018-05-03 ENCOUNTER — Emergency Department
Admission: EM | Admit: 2018-05-03 | Discharge: 2018-05-03 | Disposition: A | Discharge status: Home / Self Care | Payer: Medicare Other | Attending: Emergency Medicine | Admitting: Emergency Medicine

## 2018-05-03 ENCOUNTER — Other Ambulatory Visit: Payer: Self-pay

## 2018-05-03 DIAGNOSIS — Y9389 Activity, other specified: Secondary | ICD-10-CM | POA: Insufficient documentation

## 2018-05-03 DIAGNOSIS — Y929 Unspecified place or not applicable: Secondary | ICD-10-CM | POA: Insufficient documentation

## 2018-05-03 DIAGNOSIS — Z79899 Other long term (current) drug therapy: Secondary | ICD-10-CM | POA: Diagnosis not present

## 2018-05-03 DIAGNOSIS — Z1501 Genetic susceptibility to malignant neoplasm of breast: Secondary | ICD-10-CM | POA: Diagnosis not present

## 2018-05-03 DIAGNOSIS — S61452A Open bite of left hand, initial encounter: Secondary | ICD-10-CM | POA: Diagnosis not present

## 2018-05-03 DIAGNOSIS — W540XXA Bitten by dog, initial encounter: Secondary | ICD-10-CM | POA: Diagnosis not present

## 2018-05-03 DIAGNOSIS — Y999 Unspecified external cause status: Secondary | ICD-10-CM | POA: Diagnosis not present

## 2018-05-03 MED ORDER — CEPHALEXIN 500 MG PO CAPS: 500.0000 mg | ORAL_CAPSULE | Freq: Two times a day (BID) | ORAL | 0 refills | Status: DC

## 2018-05-03 MED ORDER — DOUBLE ANTIBIOTIC 500-10000 UNIT/GM EX OINT: 1.0000 "application " | TOPICAL_OINTMENT | Freq: Two times a day (BID) | CUTANEOUS | 0 refills | Status: DC

## 2018-05-03 MED ORDER — BACITRACIN-NEOMYCIN-POLYMYXIN 400-5-5000 EX OINT
TOPICAL_OINTMENT | Freq: Once | CUTANEOUS | Status: AC
  Administered 2018-05-03: 10:00:00 via TOPICAL
  Filled 2018-05-03: qty 1

## 2018-05-03 NOTE — ED Notes (Signed)
Pt presents today with a dog bite (family owned) to the top of the left hand. Pt's tendon is visible.

## 2018-05-03 NOTE — Discharge Instructions (Signed)
Change dressing twice a day. Replenish polysporin each time you change dressing. Call tomorrow to schedule appt with Dr. Peggye Ley on Tuesday.

## 2018-05-03 NOTE — ED Triage Notes (Signed)
Presents with dog bite to left hand  States she was trying to separate 2 dogs   And was bitten on hand  (Family own dogs)

## 2018-05-03 NOTE — ED Notes (Signed)
Bite as been reported to [place] [employer]

## 2018-05-03 NOTE — ED Provider Notes (Signed)
[facility] Emergency Department Provider Note   ____________________________________________    I have reviewed the triage vital signs and the nursing notes.   HISTORY  Chief Complaint Animal Bite     HPI Deanna Ross is a 67 y.o. female who presents after suffering animal bite to the dorsum of her left hand.  Patient reports she was pulling 2 dogs apart and 1 of them bit her.  Their domestic dogs and she is not concerned about rabies.  No other injuries reported.   Past Medical History:  Diagnosis Date  . Cancer [facility])    Has Breast CA gene  . Heart murmur     Patient Active Problem List   Diagnosis Date Noted  . Skin cyst 03/28/2018  . Seborrheic keratosis 02/18/2018  . Muscle cramp 02/18/2018  . Pedal edema 02/03/2017  . Osteoporosis 06/06/2016  . Actinic keratosis 06/06/2016  . GERD (gastroesophageal reflux disease) 06/06/2016  . Rash and nonspecific skin eruption 01/22/2016  . Left knee pain 12/09/2015  . Hyperlipidemia 11/30/2015  . BRCA2 positive 01/24/2015  . HTN (hypertension) 01/24/2015    Past Surgical History:  Procedure Laterality Date  . ABDOMINAL HYSTERECTOMY  2008  . BREAST BIOPSY Left    negative x2 done at same time  . BREAST CYST ASPIRATION Left    negative  . TONSILLECTOMY AND ADENOIDECTOMY    . TUBAL LIGATION      Prior to Admission medications   Medication Sig Start Date End Date Taking? Authorizing Provider  atorvastatin (LIPITOR) 20 MG tablet TAKE 1 TABLET(20 MG) BY MOUTH DAILY 03/31/18   Leone Haven, MD  Calcium Carb-Cholecalciferol (CALCIUM 1000 + D PO) Take by mouth.    [provider]  cephALEXin (KEFLEX) 500 MG capsule Take 1 capsule (500 mg total) by mouth 2 (two) times daily. 05/03/18   Lavonia Drafts, MD  esomeprazole (NEXIUM) 40 MG capsule TAKE 1 CAPSULE(40 MG) BY MOUTH DAILY 06/06/16   Leone Haven, MD  hydrochlorothiazide (HYDRODIURIL) 25 MG tablet Take 1 tablet (25 mg  total) by mouth daily. 08/20/17   Leone Haven, MD  lisinopril (PRINIVIL,ZESTRIL) 20 MG tablet TAKE 1 TABLET(20 MG) BY MOUTH DAILY 02/03/18   Leone Haven, MD  Multiple Vitamins-Minerals (MULTIVITAMIN ADULT PO) Take by mouth.    [provider]  polymixin-bacitracin (POLYSPORIN) 500-10000 UNIT/GM OINT ointment Apply 1 application topically 2 (two) times daily. 05/03/18   Lavonia Drafts, MD     Allergies Gadolinium derivatives; Iodine; and Tdap [tetanus-diphth-acell pertussis]  Family History  Problem Relation Age of Onset  . Hyperlipidemia Mother   . Hypertension Mother   . Cancer Daughter        Breast  . Breast cancer Daughter 13  . Breast cancer Paternal Aunt   . Breast cancer Maternal Grandmother 94  . Breast cancer Sister 34  . Cancer Paternal Uncle        Prostate  . BRCA 1/2 Other        pt states she is BRCA 2 positive    Social History Social History   Tobacco Use  . Smoking status: Never Smoker  . Smokeless tobacco: Never Used  Substance Use Topics  . Alcohol use: No    Alcohol/week: 0.0 standard drinks  . Drug use: No    Review of Systems  Constitutional: No dizziness  Cardiovascular: Denies chest pain. Respiratory: Denies shortness of breath. Gastrointestinal: No abdominal pain.     Skin: Left hand with  wound Neurological: Negative for weakness   ____________________________________________   PHYSICAL EXAM:  VITAL SIGNS: ED Triage Vitals  Enc Vitals Group     BP 05/03/18 0907 (!) 155/63     Pulse Rate 05/03/18 0907 70     Resp 05/03/18 0907 20     Temp 05/03/18 0907 98 F (36.7 C)     Temp Source 05/03/18 0907 Oral     SpO2 05/03/18 0907 98 %     Weight 05/03/18 0901 73.7 kg (162 lb 7.7 oz)     Height 05/03/18 0901 1.651 m (5' 5")     Head Circumference --      Peak Flow --      Pain Score 05/03/18 1025 2     Pain Loc --      Pain Edu? --      Excl. in [place]? --     Constitutional: Alert and oriented. No acute  distress. Pleasant and interactive Eyes: Conjunctivae are normal.   Nose: No congestion/rhinnorhea. Mouth/Throat: Mucous membranes are moist.    Cardiovascular: Normal rate, regular rhythm. Kermit Balo peripheral circulation. Respiratory: Normal respiratory effort.   Gastrointestinal: Soft and nontender. No distention.    Musculoskeletal: Tendon function normal left hand Neurologic:  Normal speech and language. No gross focal neurologic deficits are appreciated.  Skin:  Skin is warm, dry.  Proximally 3 x 3 cm wound dorsum of the center of the left hand, tendons exposed, no flap available.  Tendons appear to be in good shape.  Tendon function is normal.  No foreign bodies Psychiatric: Mood and affect are normal. Speech and behavior are normal.  ____________________________________________   LABS (all labs ordered are listed, but only abnormal results are displayed)  Labs Reviewed - No data to display ____________________________________________  EKG  None ____________________________________________  RADIOLOGY  No foreign body on x-ray ____________________________________________   PROCEDURES  Procedure(s) performed: No  Procedures   Critical Care performed: No ____________________________________________   INITIAL IMPRESSION / ASSESSMENT AND PLAN / ED COURSE  Pertinent labs & imaging results that were available during my care of the patient were reviewed by me and considered in my medical decision making (see chart for details).  Wound washed out at my direction by PA.  Discussed with hand surgery recommends Polysporin, wrap, will see the patient in office in 2 days.  Will give oral antibiotics as well    ____________________________________________   FINAL CLINICAL IMPRESSION(S) / ED DIAGNOSES  Final diagnoses:  Dog bite, initial encounter        Note:  This document was prepared using Dragon voice recognition software and may include unintentional  dictation errors.    Lavonia Drafts, MD 05/03/18 1112

## 2018-05-04 ENCOUNTER — Other Ambulatory Visit: Payer: Self-pay | Admitting: Family Medicine

## 2018-05-05 ENCOUNTER — Ambulatory Visit (INDEPENDENT_AMBULATORY_CARE_PROVIDER_SITE_OTHER): Payer: Medicare Other | Admitting: Surgery

## 2018-05-05 ENCOUNTER — Encounter: Payer: Self-pay | Admitting: Surgery

## 2018-05-05 VITALS — BP 164/82 | HR 75 | Temp 97.6°F | Ht 65.0 in | Wt 164.0 lb

## 2018-05-05 DIAGNOSIS — S61452A Open bite of left hand, initial encounter: Secondary | ICD-10-CM | POA: Diagnosis not present

## 2018-05-05 DIAGNOSIS — L729 Follicular cyst of the skin and subcutaneous tissue, unspecified: Secondary | ICD-10-CM

## 2018-05-05 NOTE — Progress Notes (Signed)
Surgical Clinic Progress/Follow-up Note   HPI:  67 y.o. Female presents to clinic for post-op follow-up 2 weeks s/p excision of formerly infected Right posterior shoulder subcutaneous mass Deanna Ross, 04/21/2018). Patient reports complete resolution of pre-operative pain and has been tolerating regular diet with +flatus and normal BM's, denies N/V, fever/chills, CP, or SOB. Of note, patient recently was bit on her Left hand to tendon by her daughter's chihuahua, for which she is scheduled to be evaluated by orthopedist later this morning.  Review of Systems:  Constitutional: denies fever/chills  Respiratory: denies shortness of breath, wheezing  Cardiovascular: denies chest pain, palpitations  Gastrointestinal: denies abdominal pain, N/V, or diarrhea Skin: Denies any other rashes or skin discolorations except post-surgical wounds and wrapped Left hand dog bite as per interval history  Vital Signs:  BP (!) 164/82   Pulse 75   Temp 97.6 F (36.4 C) (Oral)   Ht '5\' 5"'  (1.651 m)   Wt 164 lb (74.4 kg)   BMI 27.29 kg/m    Physical Exam:  Constitutional:  -- Normal body habitus  -- Awake, alert, and oriented x3  Pulmonary:  -- No crackles -- Equal breath sounds bilaterally -- Breathing non-labored at rest Cardiovascular:  -- S1, S2 present  -- No pericardial rubs  Gastrointestinal:  -- Soft and non-distended, non-tender to palpation, no guarding/rebound tenderness -- No abdominal masses appreciated, pulsatile or otherwise  Musculoskeletal / Integumentary:  -- Wounds or skin discoloration: None appreciated except post-surgical Right posterior shoulder incision well-approximated and non-tender to palpation without any surrounding erythema or drainage -- Extremities: B/L UE and LE FROM, hands and feet warm, no edema   Assessment:  67 y.o. yo Female with a problem list including...  Patient Active Problem List   Diagnosis Date Noted  . Skin cyst 03/28/2018  . Seborrheic keratosis  02/18/2018  . Muscle cramp 02/18/2018  . Pedal edema 02/03/2017  . Osteoporosis 06/06/2016  . Actinic keratosis 06/06/2016  . GERD (gastroesophageal reflux disease) 06/06/2016  . Rash and nonspecific skin eruption 01/22/2016  . Left knee pain 12/09/2015  . Hyperlipidemia 11/30/2015  . BRCA2 positive 01/24/2015  . HTN (hypertension) 01/24/2015    presents to clinic for post-op follow-up evaluation, doing well (except dog bite) 2 weeks s/p excision of formerly infected Right posterior shoulder subcutaneous mass Deanna Ross, 04/21/2018).  Plan:              - heart-healthy diet as tolerated  - post-surgical pathology results discussed             - okay to submerge incisions under water (baths, swimming) prn             - gradually resume all activities without restrictions over next 2 weeks             - apply sunblock particularly to incisions with sun exposure to reduce pigmentation of scars             - return to clinic as needed, instructed to call office if any questions or concerns  All of the above recommendations were discussed with the patient and patient's family, and all of patient's and family's questions were answered to their expressed satisfaction.  -- Deanna Ross Deanna Hoes, MD, [place]: [facility] - Partnering for exceptional care. Office: [phone]

## 2018-05-05 NOTE — Patient Instructions (Signed)
Please call the office if you have questions or concerns. 

## 2018-05-11 DIAGNOSIS — S61452A Open bite of left hand, initial encounter: Secondary | ICD-10-CM | POA: Diagnosis not present

## 2018-06-04 ENCOUNTER — Telehealth: Payer: Self-pay | Admitting: Family Medicine

## 2018-06-04 MED ORDER — HYDROCHLOROTHIAZIDE 25 MG PO TABS: 25.0000 mg | ORAL_TABLET | Freq: Every day | ORAL | 0 refills | Status: DC

## 2018-06-04 NOTE — Telephone Encounter (Signed)
Medication has been filled 

## 2018-06-04 NOTE — Telephone Encounter (Signed)
Pt needs a refill on hydrochlorothiazide (HYDRODIURIL) 25 MG tablet sent to [facility]

## 2018-06-17 ENCOUNTER — Telehealth: Payer: Self-pay | Admitting: Family Medicine

## 2018-06-17 MED ORDER — ATORVASTATIN CALCIUM 20 MG PO TABS: 20.0000 mg | ORAL_TABLET | Freq: Once | ORAL | 3 refills | Status: DC

## 2018-06-17 NOTE — Telephone Encounter (Signed)
Called and spoke with pt. Pt advised Rx has been sent.  

## 2018-06-17 NOTE — Telephone Encounter (Signed)
Pt needs a refill on atorvastatin (LIPITOR) 20 MG tablet sent to [facility]

## 2018-06-17 NOTE — Telephone Encounter (Signed)
Rx has been sent  

## 2018-07-10 ENCOUNTER — Ambulatory Visit (INDEPENDENT_AMBULATORY_CARE_PROVIDER_SITE_OTHER): Payer: Medicare Other

## 2018-07-10 VITALS — BP 120/72 | HR 65 | Temp 98.0°F | Resp 15 | Ht 65.0 in | Wt 159.8 lb

## 2018-07-10 DIAGNOSIS — M810 Age-related osteoporosis without current pathological fracture: Secondary | ICD-10-CM

## 2018-07-10 DIAGNOSIS — Z0000 Encounter for general adult medical examination without abnormal findings: Secondary | ICD-10-CM

## 2018-07-10 DIAGNOSIS — Z23 Encounter for immunization: Secondary | ICD-10-CM | POA: Diagnosis not present

## 2018-07-10 NOTE — Patient Instructions (Addendum)
Deanna Ross , Thank you for taking time to come for your Medicare Wellness Visit. I appreciate your ongoing commitment to your health goals. Please review the following plan we discussed and let me know if I can assist you in the future.   Follow up as needed.    Bring a copy of your [place] and/or Living Will to be scanned into chart.   Schedule Mammogram and Bone Density  Have a great day!  These are the goals we discussed: Goals      Patient Stated   . Weight (lb) < 150 lb (68 kg) (pt-stated)     Healthy diet Stay active       This is a list of the screening recommended for you and due dates:  Health Maintenance  Topic Date Due  . Mammogram  07/31/2018  . Colon Cancer Screening  09/23/2018  . Tetanus Vaccine  10/24/2024  . Flu Shot  Completed  . DEXA scan (bone density measurement)  Completed  .  Hepatitis C: One time screening is recommended by Center for Disease Control  (CDC) for  adults born from [birth year] through 1966.   Completed  . Pneumonia vaccines  Completed   Bone Densitometry Bone densitometry is an imaging test that uses a special X-ray to measure the amount of calcium and other minerals in your bones (bone density). This test is also known as a bone mineral density test or dual-energy X-ray absorptiometry (DXA). The test can measure bone density at your hip and your spine. It is similar to having a regular X-ray. You may have this test to:  Diagnose a condition that causes weak or thin bones (osteoporosis).  Predict your risk of a broken bone (fracture).  Determine how well osteoporosis treatment is working.  Tell a health care provider about:  Any allergies you have.  All medicines you are taking, including vitamins, herbs, eye drops, creams, and over-the-counter medicines.  Any problems you or family members have had with anesthetic medicines.  Any blood disorders you have.  Any surgeries you have had.  Any medical  conditions you have.  Possibility of pregnancy.  Any other medical test you had within the previous 14 days that used contrast material. What are the risks? Generally, this is a safe procedure. However, problems can occur and may include the following:  This test exposes you to a very small amount of radiation.  The risks of radiation exposure may be greater to unborn children.  What happens before the procedure?  Do not take any calcium supplements for 24 hours before having the test. You can otherwise eat and drink what you usually do.  Take off all metal jewelry, eyeglasses, dental appliances, and any other metal objects. What happens during the procedure?  You may lie on an exam table. There will be an X-ray generator below you and an imaging device above you.  Other devices, such as boxes or braces, may be used to position your body properly for the scan.  You will need to lie still while the machine slowly scans your body.  The images will show up on a computer monitor. What happens after the procedure? You may need more testing at a later time. This information is not intended to replace advice given to you by your health care provider. Make sure you discuss any questions you have with your health care provider. Document Released: 10/01/2004 Document Revised: 02/15/2016 Document Reviewed: 02/17/2014 Elsevier Interactive Patient Education  2018 Elsevier Inc.  

## 2018-07-10 NOTE — Progress Notes (Signed)
Subjective:   Amrie Gurganus is a 67 y.o. female who presents for Medicare Annual (Subsequent) preventive examination.  Review of Systems:  No ROS.  Medicare Wellness Visit. Additional risk factors are reflected in the social history. Cardiac Risk Factors include: advanced age (>33mn, >>31women);hypertension     Objective:     Vitals: BP 120/72 (BP Location: Left Arm, Patient Position: Sitting, Cuff Size: Normal)   Pulse 65   Temp 98 F (36.7 C) (Oral)   Resp 15   Ht _0  (1.651 m)   Wt 159 lb 12.8 oz (72.5 kg)   SpO2 99%   BMI 26.59 kg/m   Body mass index is 26.59 kg/m.  Advanced Directives 07/10/2018 05/03/2018 07/09/2017 08/27/2016 07/09/2016  Does Patient Have a Medical Advance Directive? Yes No No Yes Yes  Type of Advance Directive Living will - - Living will Living will  Does patient want to make changes to medical advance directive? No - Patient declined - - - -  Copy of HPress photographerin Chart? - - - - No - copy requested  Would patient like information on creating a medical advance directive? - No - Patient declined No - Patient declined - -    Tobacco Social History   Tobacco Use  Smoking Status Never Smoker  Smokeless Tobacco Never Used     Counseling given: Not Answered   Clinical Intake:  Pre-visit preparation completed: Yes  Pain : No/denies pain     Nutritional Status: BMI 25 -29 Overweight Diabetes: No  How often do you need to have someone help you when you read instructions, pamphlets, or other written materials from your doctor or pharmacy?: 1 - Never  Interpreter Needed?: No     Past Medical History:  Diagnosis Date  . Cancer [facility]    Has Breast CA gene  . Heart murmur   . Skin cyst 03/28/2018   Past Surgical History:  Procedure Laterality Date  . ABDOMINAL HYSTERECTOMY  2008  . BREAST BIOPSY Left    negative x2 done at same time  . BREAST CYST ASPIRATION Left    negative  . TONSILLECTOMY AND ADENOIDECTOMY     . TUBAL LIGATION     Family History  Problem Relation Age of Onset  . Hyperlipidemia Mother   . Hypertension Mother   . Cancer Daughter        Breast  . Breast cancer Daughter 434 . Breast cancer Paternal Aunt   . Breast cancer Maternal Grandmother 478 . Breast cancer Sister 644 . Cancer Paternal Uncle        Prostate  . BRCA 1/2 Other        pt states she is BRCA 2 positive   Social History   Socioeconomic History  . Marital status: Married    Spouse name: Not on file  . Number of children: Not on file  . Years of education: Not on file  . Highest education level: Not on file  Occupational History  . Not on file  Social Needs  . Financial resource strain: Not hard at all  . Food insecurity:    Worry: Never true    Inability: Never true  . Transportation needs:    Medical: No    Non-medical: No  Tobacco Use  . Smoking status: Never Smoker  . Smokeless tobacco: Never Used  Substance and Sexual Activity  . Alcohol use: No    Alcohol/week: 0.0 standard drinks  .  Drug use: No  . Sexual activity: Never    Partners: Male  Lifestyle  . Physical activity:    Days per week: 3 days    Minutes per session: 60 min  . Stress: Not at all  Relationships  . Social connections:    Talks on phone: Not on file    Gets together: Not on file    Attends religious service: Not on file    Active member of club or organization: Not on file    Attends meetings of clubs or organizations: Not on file    Relationship status: Not on file  Other Topics Concern  . Not on file  Social History Narrative   Retired 4 years ago from [employer] with husband   3 daughters (48, 17, 10)    21 Grandchildren and 1 great grandchild    Enjoys swimming   2 cats and 1 dog- live inside the house     Outpatient Encounter Medications as of 07/10/2018  Medication Sig  . Calcium Carb-Cholecalciferol (CALCIUM 1000 + D PO) Take by mouth.  . esomeprazole (NEXIUM) 40 MG capsule TAKE 1  CAPSULE(40 MG) BY MOUTH DAILY  . hydrochlorothiazide (HYDRODIURIL) 25 MG tablet Take 1 tablet (25 mg total) by mouth daily.  Marland Kitchen lisinopril (PRINIVIL,ZESTRIL) 20 MG tablet TAKE 1 TABLET(20 MG) BY MOUTH DAILY  . Multiple Vitamins-Minerals (MULTIVITAMIN ADULT PO) Take by mouth.  . [DISCONTINUED] cephALEXin (KEFLEX) 500 MG capsule Take 1 capsule (500 mg total) by mouth 2 (two) times daily.  . [DISCONTINUED] polymixin-bacitracin (POLYSPORIN) 500-10000 UNIT/GM OINT ointment Apply 1 application topically 2 (two) times daily.  Marland Kitchen atorvastatin (LIPITOR) 20 MG tablet Take 1 tablet (20 mg total) by mouth once for 1 dose.   No facility-administered encounter medications on file as of 07/10/2018.     Activities of Daily Living In your present state of health, do you have any difficulty performing the following activities: 07/10/2018  Hearing? N  Vision? N  Difficulty concentrating or making decisions? N  Walking or climbing stairs? N  Dressing or bathing? N  Doing errands, shopping? N  Preparing Food and eating ? N  Using the Toilet? N  In the past six months, have you accidently leaked urine? N  Do you have problems with loss of bowel control? N  Managing your Medications? N  Managing your Finances? N  Housekeeping or managing your Housekeeping? N  Some recent data might be hidden    Patient Care Team: Leone Haven, MD as PCP - General (Family Medicine)    Assessment:   This is a routine wellness examination for Dewey.  The goal of the wellness visit is to assist the patient how to close the gaps in care and create a preventative care plan for the patient.   The roster of all physicians providing medical care to patient is listed in the Snapshot section of the chart.  Taking calcium VIT D as appropriate/Osteoporosis  reviewed.  Requests bone density; last 2016. Deferred to pcp to order.   Safety issues reviewed; Smoke and carbon monoxide detectors in the home. No firearms in the  home. Wears seatbelts when driving or riding with others. No violence in the home.  They do not have excessive sun exposure.  Discussed the need for sun protection: hats, long sleeves and the use of sunscreen if there is significant sun exposure.  Patient is alert, normal appearance, oriented to person/place/and time.  Correctly identified the president of the  [place] and recalls of 3/3 words. Performs simple calculations and can read correct time from watch face.  Displays appropriate judgement.  No new identified risk were noted.  No failures at ADL's or IADL's.   BMI- discussed the importance of a healthy diet, water intake and the benefits of aerobic exercise. Educational material provided.   24 hour diet recall: Moderate diet  Dental- UTD.  Eye- Visual acuity not assessed per patient preference since they have regular follow up with the ophthalmologist.  Wears corrective lenses.  Sleep patterns- Sleeps about 9 hours at night.  Wakes feeling rested.   High dose influenza vaccine administered L deltoid, tolerated well. Educational material provided.  Mammogram discussed. She plans to call and schedule. Due 07/31/18.  Health maintenance gaps- closed.  Patient Concerns: None at this time. Follow up with PCP as needed.  Exercise Activities and Dietary recommendations Current Exercise Habits: Home exercise routine, Type of exercise: walking, Time (Minutes): 60, Frequency (Times/Week): 3, Weekly Exercise (Minutes/Week): 180, Intensity: Mild  Goals      Patient Stated   . Weight (lb) < 150 lb (68 kg) (pt-stated)     Healthy diet Stay active       Fall Risk Fall Risk  07/10/2018 07/09/2017 07/09/2016 11/30/2014  Falls in the past year? No No No No   Depression Screen PHQ 2/9 Scores 07/10/2018 07/09/2017 07/09/2016 11/30/2014  PHQ - 2 Score 0 0 0 0  PHQ- 9 Score - 0 - -     Cognitive Function MMSE - Mini Mental State Exam 07/10/2018 07/09/2017 07/09/2016  Orientation to  time _0 Orientation to Place _1 Registration _2 Attention/ Calculation _3 Recall _4 Language- name 2 objects _5 Language- repeat _6 Language- follow 3 step command _7 Language- read & follow direction _8 Write a sentence _9 Copy design 1 0 1  Copy design-comments - difficulty copying the design -  Total score _10 Immunization History  Administered Date(s) Administered  . Influenza Split 06/23/2014  . Influenza, High Dose Seasonal PF 06/06/2016, 07/09/2017, 07/10/2018  . Influenza-Unspecified 07/06/2015  . Pneumococcal Conjugate-13 11/30/2014  . Pneumococcal Polysaccharide-23 07/09/2016   Screening Tests Health Maintenance  Topic Date Due  . MAMMOGRAM  07/31/2018  . COLONOSCOPY  09/23/2018  . TETANUS/TDAP  10/24/2024  . INFLUENZA VACCINE  Completed  . DEXA SCAN  Completed  . Hepatitis C Screening  Completed  . PNA vac Low Risk Adult  Completed      Plan:    End of life planning; Advance aging; Advanced directives discussed. Copy of current HCPOA/Living Will requested.    I have personally reviewed and noted the following in the patient's chart:   . Medical and social history . Use of alcohol, tobacco or illicit drugs  . Current medications and supplements . Functional ability and status . Nutritional status . Physical activity . Advanced directives . List of other physicians . Hospitalizations, surgeries, and ER visits in previous 12 months . Vitals . Screenings to include cognitive, depression, and falls . Referrals and appointments  In addition, I have reviewed and discussed with patient certain preventive protocols, quality metrics, and best practice recommendations. A written personalized care plan for preventive services as well as general preventive health recommendations were provided to patient.  Varney Biles, LPN  [phone]

## 2018-07-11 NOTE — Progress Notes (Signed)
I have reviewed the above note and agree.  Chancy Smigiel, M.D.  

## 2018-07-13 ENCOUNTER — Other Ambulatory Visit: Payer: Self-pay | Admitting: Family Medicine

## 2018-07-13 DIAGNOSIS — Z1231 Encounter for screening mammogram for malignant neoplasm of breast: Secondary | ICD-10-CM

## 2018-07-23 ENCOUNTER — Other Ambulatory Visit: Payer: Self-pay | Admitting: Family Medicine

## 2018-07-27 ENCOUNTER — Other Ambulatory Visit: Payer: Self-pay | Admitting: Family Medicine

## 2018-07-27 MED ORDER — LISINOPRIL 20 MG PO TABS: ORAL_TABLET | ORAL | 0 refills | Status: DC

## 2018-07-27 NOTE — Telephone Encounter (Signed)
Copied from [place] [phone]. Topic: Quick Communication - Rx Refill/Question >> Jul 27, 2018 11:04 AM Oneta Rack wrote:  Medication: lisinopril (PRINIVIL,ZESTRIL) 20 MG tablet    Has the patient contacted their pharmacy? No, spouse would like refill to go to the mail order  Preferred Pharmacy (with phone number or street name): [facility], [place] [phone] (Phone) [phone] (Fax)  Agent: Please be advised that RX refills may take up to 3 business days. We ask that you follow-up with your pharmacy.

## 2018-08-18 ENCOUNTER — Ambulatory Visit
Admission: RE | Admit: 2018-08-18 | Discharge: 2018-08-18 | Disposition: A | Discharge status: Home / Self Care | Payer: Medicare Other | Source: Ambulatory Visit | Attending: Family Medicine | Admitting: Family Medicine

## 2018-08-18 DIAGNOSIS — Z1231 Encounter for screening mammogram for malignant neoplasm of breast: Secondary | ICD-10-CM | POA: Diagnosis present

## 2018-08-18 DIAGNOSIS — M810 Age-related osteoporosis without current pathological fracture: Secondary | ICD-10-CM | POA: Diagnosis present

## 2018-08-24 ENCOUNTER — Ambulatory Visit (INDEPENDENT_AMBULATORY_CARE_PROVIDER_SITE_OTHER): Payer: Medicare Other | Admitting: Family Medicine

## 2018-08-24 ENCOUNTER — Encounter: Payer: Self-pay | Admitting: Family Medicine

## 2018-08-24 VITALS — BP 140/90 | HR 72 | Temp 98.1°F | Ht 65.0 in | Wt 158.2 lb

## 2018-08-24 DIAGNOSIS — S61452A Open bite of left hand, initial encounter: Secondary | ICD-10-CM

## 2018-08-24 DIAGNOSIS — E785 Hyperlipidemia, unspecified: Secondary | ICD-10-CM

## 2018-08-24 DIAGNOSIS — K219 Gastro-esophageal reflux disease without esophagitis: Secondary | ICD-10-CM | POA: Diagnosis not present

## 2018-08-24 DIAGNOSIS — I10 Essential (primary) hypertension: Secondary | ICD-10-CM

## 2018-08-24 DIAGNOSIS — W540XXA Bitten by dog, initial encounter: Secondary | ICD-10-CM

## 2018-08-24 DIAGNOSIS — S61459A Open bite of unspecified hand, initial encounter: Secondary | ICD-10-CM | POA: Insufficient documentation

## 2018-08-24 DIAGNOSIS — R7303 Prediabetes: Secondary | ICD-10-CM | POA: Insufficient documentation

## 2018-08-24 DIAGNOSIS — M8580 Other specified disorders of bone density and structure, unspecified site: Secondary | ICD-10-CM

## 2018-08-24 LAB — LIPID PANEL
CHOL/HDL RATIO: 3
Cholesterol: 155 mg/dL (ref 0–200)
HDL: 48.2 mg/dL (ref >39.00)
NONHDL: 106.43
Triglycerides: 207 mg/dL — ABNORMAL HIGH (ref 0.0–149.0)
VLDL: 41.4 mg/dL — ABNORMAL HIGH (ref 0.0–40.0)

## 2018-08-24 LAB — LDL CHOLESTEROL, DIRECT: Direct LDL: 75 mg/dL

## 2018-08-24 LAB — BASIC METABOLIC PANEL
BUN: 16 mg/dL (ref 6–23)
CALCIUM: 10.2 mg/dL (ref 8.4–10.5)
CHLORIDE: 101 meq/L (ref 96–112)
CO2: 29 meq/L (ref 19–32)
CREATININE: 0.71 mg/dL (ref 0.40–1.20)
GFR: 86.81 mL/min (ref >60.00)
GLUCOSE: 116 mg/dL — AB (ref 70–99)
Potassium: 4 mEq/L (ref 3.5–5.1)
SODIUM: 139 meq/L (ref 135–145)

## 2018-08-24 LAB — HEMOGLOBIN A1C: HEMOGLOBIN A1C: 5.6 % (ref 4.6–6.5)

## 2018-08-24 NOTE — Patient Instructions (Signed)
Nice to see you. Please continue to monitor your blood pressure at home.  If it starts to trend up please let us know. Please monitor your reflux and if you develop recurrent symptoms please let us know.

## 2018-08-24 NOTE — Assessment & Plan Note (Signed)
Adequately controlled.  Continue Nexium.  She will monitor her diet.

## 2018-08-24 NOTE — Assessment & Plan Note (Signed)
Check A1c. 

## 2018-08-24 NOTE — Assessment & Plan Note (Signed)
Well-controlled at home.  She will monitor and if it trends up let us know.  Continue current regimen.  Check labs.

## 2018-08-24 NOTE — Assessment & Plan Note (Signed)
Recent DEXA scan with osteopenia.  Discussed calcium and vitamin D.  She will stay active with walking.

## 2018-08-24 NOTE — Assessment & Plan Note (Signed)
This has healed well.  She has already seen hand surgery.

## 2018-08-24 NOTE — Assessment & Plan Note (Signed)
Check lipid panel.  Continue Lipitor. 

## 2018-08-24 NOTE — Progress Notes (Signed)
  [name], MD Phone: [phone]  Deanna Ross is a 67 y.o. female who presents today for follow-up.  CC: Hypertension, hyperlipidemia, GERD, dog bite  Hypertension: Typically 120s over 70s at home.  Taking HCTZ and lisinopril.  No chest pain, shortness of breath, or edema.  Hyperlipidemia: Taking Lipitor.  No right upper quadrant pain or myalgias.  GERD: Taking Nexium.  Only has reflux symptoms if she overeats and she has not been doing that recently.  No abdominal pain, blood in her stool, or dysphagia.  Reports having had an EGD about 5 years ago.  She is trying to eat better and not eat as much.  Dog bite: Patient suffered a dog bite to the dorsum of her left hand.  She was treated with antibiotics.  She followed up with a hand surgeon.  It has healed well.  She does note some mild numbness in her second and third knuckles since the bite.  Social History   Tobacco Use  Smoking Status Never Smoker  Smokeless Tobacco Never Used     ROS see history of present illness  Objective  Physical Exam Vitals:   08/24/18 0955 08/24/18 1022  BP: 130/80 140/90  Pulse: 72   Temp: 98.1 F (36.7 C)   SpO2: 98%     BP Readings from Last 3 Encounters:  08/24/18 140/90  07/10/18 120/72  05/05/18 (!) 164/82   Wt Readings from Last 3 Encounters:  08/24/18 158 lb 3.2 oz (71.8 kg)  07/10/18 159 lb 12.8 oz (72.5 kg)  05/05/18 164 lb (74.4 kg)    Physical Exam  Constitutional: No distress.  Cardiovascular: Normal rate, regular rhythm and normal heart sounds.  Pulmonary/Chest: Effort normal and breath sounds normal.  Musculoskeletal: She exhibits no edema.  Well-healed scar dorsum left hand, slight decreased light touch sensation in second and third knuckles in her left hand otherwise intact light touch sensation in her hand, 5/5 grip strength left hand, hand is warm and well-perfused  Neurological: She is alert.  Skin: Skin is warm and dry. She is not diaphoretic.      Assessment/Plan: Please see individual problem list.  HTN (hypertension) Well-controlled at home.  She will monitor and if it trends up let us know.  Continue current regimen.  Check labs.  GERD (gastroesophageal reflux disease) Adequately controlled.  Continue Nexium.  She will monitor her diet.  Osteopenia Recent DEXA scan with osteopenia.  Discussed calcium and vitamin D.  She will stay active with walking.  Hyperlipidemia Check lipid panel.  Continue Lipitor.  Prediabetes Check A1c.  Dog bite of hand This has healed well.  She has already seen hand surgery.   Orders Placed This Encounter  Procedures  . Basic Metabolic Panel (BMET)  . Lipid panel  . HgB A1c    No orders of the defined types were placed in this encounter.    [name], MD [place]

## 2018-08-25 ENCOUNTER — Encounter: Payer: Self-pay | Admitting: *Deleted

## 2018-09-06 ENCOUNTER — Other Ambulatory Visit: Payer: Self-pay | Admitting: Family Medicine

## 2018-09-07 MED ORDER — HYDROCHLOROTHIAZIDE 25 MG PO TABS: 25.0000 mg | ORAL_TABLET | Freq: Every day | ORAL | 1 refills | Status: DC

## 2018-09-27 ENCOUNTER — Other Ambulatory Visit: Payer: Self-pay | Admitting: Family Medicine

## 2018-10-19 ENCOUNTER — Other Ambulatory Visit: Payer: Self-pay | Admitting: Family Medicine

## 2018-10-20 MED ORDER — LISINOPRIL 20 MG PO TABS: ORAL_TABLET | ORAL | 0 refills | Status: DC

## 2018-11-27 ENCOUNTER — Telehealth: Payer: Self-pay | Admitting: Family Medicine

## 2018-11-27 ENCOUNTER — Other Ambulatory Visit: Payer: Self-pay

## 2018-11-27 MED ORDER — HYDROCHLOROTHIAZIDE 25 MG PO TABS: 25.0000 mg | ORAL_TABLET | Freq: Every day | ORAL | 1 refills | Status: DC

## 2018-11-27 NOTE — Telephone Encounter (Signed)
Copied from [place] [id number]. Topic: Quick Communication - Rx Refill/Question >> Nov 27, 2018 10:01 AM Blase Mess A wrote: Medication: hydrochlorothiazide (HYDRODIURIL) 25 MG tablet [[id number]]   Has the patient contacted their pharmacy? Yes  (Agent: If no, request that the patient contact the pharmacy for the refill.) (Agent: If yes, when and what did the pharmacy advise?)  Preferred Pharmacy (with phone number or street name): [place], [place] [phone] (Phone) [phone] (Fax)    Agent: Please be advised that RX refills may take up to 3 business days. We ask that you follow-up with your pharmacy.

## 2018-11-27 NOTE — Telephone Encounter (Signed)
Rx has been sent. Called and spoke with patient. Pt advised and voiced understanding.

## 2018-12-21 ENCOUNTER — Other Ambulatory Visit: Payer: Self-pay | Admitting: Family Medicine

## 2018-12-30 DIAGNOSIS — L820 Inflamed seborrheic keratosis: Secondary | ICD-10-CM | POA: Diagnosis not present

## 2018-12-30 DIAGNOSIS — Z85828 Personal history of other malignant neoplasm of skin: Secondary | ICD-10-CM | POA: Diagnosis not present

## 2018-12-30 DIAGNOSIS — D485 Neoplasm of uncertain behavior of skin: Secondary | ICD-10-CM | POA: Diagnosis not present

## 2018-12-30 DIAGNOSIS — L570 Actinic keratosis: Secondary | ICD-10-CM | POA: Diagnosis not present

## 2018-12-30 DIAGNOSIS — Z1283 Encounter for screening for malignant neoplasm of skin: Secondary | ICD-10-CM | POA: Diagnosis not present

## 2018-12-30 DIAGNOSIS — L578 Other skin changes due to chronic exposure to nonionizing radiation: Secondary | ICD-10-CM | POA: Diagnosis not present

## 2019-01-17 ENCOUNTER — Other Ambulatory Visit: Payer: Self-pay | Admitting: Family Medicine

## 2019-01-18 MED ORDER — LISINOPRIL 20 MG PO TABS: 20.0000 mg | ORAL_TABLET | Freq: Every day | ORAL | 0 refills | Status: DC

## 2019-02-24 ENCOUNTER — Encounter: Payer: Self-pay | Admitting: Family Medicine

## 2019-02-24 ENCOUNTER — Other Ambulatory Visit: Payer: Self-pay

## 2019-02-24 ENCOUNTER — Ambulatory Visit (INDEPENDENT_AMBULATORY_CARE_PROVIDER_SITE_OTHER): Payer: Medicare Other | Admitting: Family Medicine

## 2019-02-24 DIAGNOSIS — L821 Other seborrheic keratosis: Secondary | ICD-10-CM | POA: Diagnosis not present

## 2019-02-24 DIAGNOSIS — E785 Hyperlipidemia, unspecified: Secondary | ICD-10-CM | POA: Diagnosis not present

## 2019-02-24 DIAGNOSIS — Z1509 Genetic susceptibility to other malignant neoplasm: Secondary | ICD-10-CM

## 2019-02-24 DIAGNOSIS — R7303 Prediabetes: Secondary | ICD-10-CM

## 2019-02-24 DIAGNOSIS — Z1211 Encounter for screening for malignant neoplasm of colon: Secondary | ICD-10-CM | POA: Diagnosis not present

## 2019-02-24 DIAGNOSIS — I10 Essential (primary) hypertension: Secondary | ICD-10-CM | POA: Diagnosis not present

## 2019-02-24 DIAGNOSIS — Z1501 Genetic susceptibility to malignant neoplasm of breast: Secondary | ICD-10-CM

## 2019-02-24 NOTE — Assessment & Plan Note (Signed)
Well-controlled.  We will have her come in for lab work in the next 1 to 2 months.  Continue current medications.

## 2019-02-24 NOTE — Assessment & Plan Note (Signed)
Check A1c.  Continue diet and exercise. 

## 2019-02-24 NOTE — Assessment & Plan Note (Signed)
Continue Lipitor.  Continue diet and exercise.

## 2019-02-24 NOTE — Assessment & Plan Note (Signed)
Referral placed to GI for colonoscopy.

## 2019-02-24 NOTE — Progress Notes (Signed)
Virtual Visit via telephone Note  This visit type was conducted due to national recommendations for restrictions regarding the COVID-19 pandemic (e.g. social distancing).  This format is felt to be most appropriate for this patient at this time.  All issues noted in this document were discussed and addressed.  No physical exam was performed (except for noted visual exam findings with Video Visits).   I connected with Deanna Ross today at  9:00 AM EDT by telephone and verified that I am speaking with the correct person using two identifiers. Location patient: home Location provider: work Persons participating in the virtual visit: patient, provider  I discussed the limitations, risks, security and privacy concerns of performing an evaluation and management service by telephone and the availability of in person appointments. I also discussed with the patient that there may be a patient responsible charge related to this service. The patient expressed understanding and agreed to proceed.  Interactive audio and video telecommunications were attempted between this provider and patient, however failed, due to patient having technical difficulties OR patient did not have access to video capability.  We continued and completed visit with audio only.   Reason for visit: follow-up  HPI: Hyperlipidemia: Taking Lipitor.  No right upper quadrant pain, myalgias, or claudication.  She has been exercising by walking 6 miles daily.  Diet is fairly healthy with plenty of fruits and vegetables.  No significant junk food.  Hypertension: Has been running 125-127/69-74.  Taking HCTZ and lisinopril.  No chest pain, shortness of breath, or edema.  Seborrheic keratosis: Patient reports she has been following with dermatology yearly.  She saw them in March and had a growth taken off of her shoulder that was noncancerous.  She notes no new skin lesions since then.   ROS: See pertinent positives and negatives per  HPI.  Past Medical History:  Diagnosis Date  . Cancer [facility])    Has BRCA 2 gene  . Heart murmur   . Skin cyst 03/28/2018    Past Surgical History:  Procedure Laterality Date  . ABDOMINAL HYSTERECTOMY  2008  . BREAST CYST ASPIRATION Left    negative  . BREAST EXCISIONAL BIOPSY Left    negative x2 done at same time  . TONSILLECTOMY AND ADENOIDECTOMY    . TUBAL LIGATION      Family History  Problem Relation Age of Onset  . Hyperlipidemia Mother   . Hypertension Mother   . Cancer Daughter        Breast  . Breast cancer Daughter 44  . Breast cancer Paternal Aunt 52  . Breast cancer Maternal Grandmother 54  . Breast cancer Sister 15  . Cancer Paternal Uncle        Prostate  . BRCA 1/2 Other        pt states she is BRCA 2 positive    SOCIAL HX: Non-smoker.   Current Outpatient Medications:  .  atorvastatin (LIPITOR) 20 MG tablet, Take 1 tablet (20 mg total) by mouth once for 1 dose., Disp: 90 tablet, Rfl: 3 .  Calcium Carb-Cholecalciferol (CALCIUM 1000 + D PO), Take by mouth., Disp: , Rfl:  .  esomeprazole (NEXIUM) 40 MG capsule, TAKE 1 CAPSULE(40 MG) BY MOUTH DAILY, Disp: 90 capsule, Rfl: 0 .  hydrochlorothiazide (HYDRODIURIL) 25 MG tablet, Take 1 tablet (25 mg total) by mouth daily., Disp: 90 tablet, Rfl: 1 .  lisinopril (ZESTRIL) 20 MG tablet, Take 1 tablet (20 mg total) by mouth daily., Disp: 90 tablet, Rfl:  0 .  Multiple Vitamins-Minerals (MULTIVITAMIN ADULT PO), Take by mouth., Disp: , Rfl:   EXAM: This was a telehealth telephone visit and thus no physical exam was completed.  ASSESSMENT AND PLAN:  Discussed the following assessment and plan:  Essential hypertension - Plan: Basic metabolic panel  Seborrheic keratosis  Colon cancer screening - Plan: Ambulatory referral to Gastroenterology  Hyperlipidemia, unspecified hyperlipidemia type  Prediabetes - Plan: Hemoglobin A1c  BRCA2 positive  HTN (hypertension) Well-controlled.  We will have her come in for  lab work in the next 1 to 2 months.  Continue current medications.  Seborrheic keratosis No new skin lesions.  She will continue to see dermatology for yearly exams.  Hyperlipidemia Continue Lipitor.  Continue diet and exercise.  Colon cancer screening Referral placed to GI for colonoscopy.  Prediabetes Check A1c.  Continue diet and exercise.  BRCA2 positive Mammogram is up-to-date.  This was negative.  Patient previously saw oncology for genetics counseling.  Please see their note for their recommendations.  They did not feel that MRI screening would provide any additional benefit as long as the patient continued to have yearly mammograms.  [place] office staff will contact the patient to get her scheduled for follow-up and lab work.  Social distancing precautions and sick precautions given regarding COVID-19.   I discussed the assessment and treatment plan with the patient. The patient was provided an opportunity to ask questions and all were answered. The patient agreed with the plan and demonstrated an understanding of the instructions.   The patient was advised to call back or seek an in-person evaluation if the symptoms worsen or if the condition fails to improve as anticipated.  I provided 17 minutes of non-face-to-face time during this encounter.   [name], MD

## 2019-02-24 NOTE — Assessment & Plan Note (Signed)
No new skin lesions.  She will continue to see dermatology for yearly exams.

## 2019-02-24 NOTE — Assessment & Plan Note (Signed)
Mammogram is up-to-date.  This was negative.  Patient previously saw oncology for genetics counseling.  Please see their note for their recommendations.  They did not feel that MRI screening would provide any additional benefit as long as the patient continued to have yearly mammograms.

## 2019-03-21 ENCOUNTER — Other Ambulatory Visit: Payer: Self-pay | Admitting: Family Medicine

## 2019-04-19 ENCOUNTER — Other Ambulatory Visit: Payer: Self-pay | Admitting: Family Medicine

## 2019-05-21 NOTE — Telephone Encounter (Signed)
err

## 2019-05-31 ENCOUNTER — Other Ambulatory Visit: Payer: Self-pay | Admitting: Family Medicine

## 2019-07-14 ENCOUNTER — Encounter: Payer: Self-pay | Admitting: Family Medicine

## 2019-07-14 ENCOUNTER — Ambulatory Visit (INDEPENDENT_AMBULATORY_CARE_PROVIDER_SITE_OTHER): Payer: Medicare Other

## 2019-07-14 ENCOUNTER — Other Ambulatory Visit: Payer: Self-pay

## 2019-07-14 ENCOUNTER — Ambulatory Visit (INDEPENDENT_AMBULATORY_CARE_PROVIDER_SITE_OTHER): Payer: Medicare Other | Admitting: Family Medicine

## 2019-07-14 VITALS — BP 131/70 | HR 76 | Wt 135.0 lb

## 2019-07-14 DIAGNOSIS — K219 Gastro-esophageal reflux disease without esophagitis: Secondary | ICD-10-CM

## 2019-07-14 DIAGNOSIS — R351 Nocturia: Secondary | ICD-10-CM | POA: Diagnosis not present

## 2019-07-14 DIAGNOSIS — Z1211 Encounter for screening for malignant neoplasm of colon: Secondary | ICD-10-CM

## 2019-07-14 DIAGNOSIS — I10 Essential (primary) hypertension: Secondary | ICD-10-CM | POA: Diagnosis not present

## 2019-07-14 DIAGNOSIS — Z0000 Encounter for general adult medical examination without abnormal findings: Secondary | ICD-10-CM

## 2019-07-14 DIAGNOSIS — R7303 Prediabetes: Secondary | ICD-10-CM | POA: Diagnosis not present

## 2019-07-14 NOTE — Progress Notes (Signed)
Virtual Visit via telephone Note  This visit type was conducted due to national recommendations for restrictions regarding the COVID-19 pandemic (e.g. social distancing).  This format is felt to be most appropriate for this patient at this time.  All issues noted in this document were discussed and addressed.  No physical exam was performed (except for noted visual exam findings with Video Visits).   I connected with Deanna Ross today at 10:00 AM EDT by telephone and verified that I am speaking with the correct person using two identifiers. Location patient: home Location provider: work Persons participating in the virtual visit: patient, provider  I discussed the limitations, risks, security and privacy concerns of performing an evaluation and management service by telephone and the availability of in person appointments. I also discussed with the patient that there may be a patient responsible charge related to this service. The patient expressed understanding and agreed to proceed.  Interactive audio and video telecommunications were attempted between this provider and patient, however failed, due to patient having technical difficulties OR patient did not have access to video capability.  We continued and completed visit with audio only.   Reason for visit: follow-up  HPI: HYPERTENSION  Disease Monitoring  Home BP Monitoring 130/70 Chest pain- no    Dyspnea- no Medications  Compliance-  Taking HCTZ, lisinopril.  Edema- no  GERD:   Reflux symptoms: only with certain foods   Abd pain: no   Blood in stool: no  Dysphagia: no    Medication: taking nexium prn for preventative measures  Nocturia: Patient notes this has been going on for a couple of weeks.  She notes she has to get up multiple times at night due to feeling a pressure sensation in her bladder and then she will only go a small amount.  She does note yesterday it started bothering her during the daytime.  Some urgency.   No significant daytime frequency.    ROS: See pertinent positives and negatives per HPI.  Past Medical History:  Diagnosis Date  . Cancer [facility])    Has BRCA 2 gene  . Heart murmur   . Skin cyst 03/28/2018    Past Surgical History:  Procedure Laterality Date  . ABDOMINAL HYSTERECTOMY  2008  . BREAST CYST ASPIRATION Left    negative  . BREAST EXCISIONAL BIOPSY Left    negative x2 done at same time  . TONSILLECTOMY AND ADENOIDECTOMY    . TUBAL LIGATION      Family History  Problem Relation Age of Onset  . Hyperlipidemia Mother   . Hypertension Mother   . Cancer Daughter        Breast  . Breast cancer Daughter 14  . Breast cancer Paternal Aunt 74  . Breast cancer Maternal Grandmother 61  . Breast cancer Sister 50  . Cancer Paternal Uncle        Prostate  . BRCA 1/2 Other        pt states she is BRCA 2 positive    SOCIAL HX: Non-smoker.   Current Outpatient Medications:  .  atorvastatin (LIPITOR) 20 MG tablet, TAKE 1 TABLET BY MOUTH  DAILY, Disp: 90 tablet, Rfl: 3 .  Calcium Carb-Cholecalciferol (CALCIUM 1000 + D PO), Take by mouth., Disp: , Rfl:  .  esomeprazole (NEXIUM) 40 MG capsule, TAKE 1 CAPSULE(40 MG) BY MOUTH DAILY, Disp: 90 capsule, Rfl: 0 .  hydrochlorothiazide (HYDRODIURIL) 25 MG tablet, TAKE 1 TABLET BY MOUTH  DAILY, Disp: 90 tablet, Rfl:  3 .  lisinopril (ZESTRIL) 20 MG tablet, TAKE 1 TABLET BY MOUTH  DAILY, Disp: 90 tablet, Rfl: 3 .  Multiple Vitamins-Minerals (MULTIVITAMIN ADULT PO), Take by mouth., Disp: , Rfl:   EXAM: This was a telehealth telephone visit and thus no physical exam was completed.  ASSESSMENT AND PLAN:  Discussed the following assessment and plan:  HTN (hypertension) Adequately controlled.  Continue current regimen.  Check lab work.  Prediabetes Check A1c.  GERD (gastroesophageal reflux disease) Adequately controlled.  Continue as needed Nexium.  She will monitor her diet.  Nocturia Suspect overactive bladder though we will  check lab work and a urinalysis to rule out other causes.  Consider medication management once lab work returns.  Colon cancer screening Patient declines completing this at this time due to the COVID-19 pandemic.  She has the number to call when she is ready to schedule this.    I discussed the assessment and treatment plan with the patient. The patient was provided an opportunity to ask questions and all were answered. The patient agreed with the plan and demonstrated an understanding of the instructions.   The patient was advised to call back or seek an in-person evaluation if the symptoms worsen or if the condition fails to improve as anticipated.  I provided 10 minutes of non-face-to-face time during this encounter.   [name], MD

## 2019-07-14 NOTE — Assessment & Plan Note (Signed)
Suspect overactive bladder though we will check lab work and a urinalysis to rule out other causes.  Consider medication management once lab work returns.

## 2019-07-14 NOTE — Progress Notes (Addendum)
Subjective:   Deanna Ross is a 68 y.o. female who presents for Medicare Annual (Subsequent) preventive examination.  Review of Systems:  No ROS.  Medicare Wellness Virtual Visit.  Visual/audio telehealth visit. Vital signs provided by patient.  See social history for additional risk factors.   Cardiac Risk Factors include: advanced age (>75mn, >>40women);hypertension     Objective:     Vitals: BP 131/70 (BP Location: Left Arm, Patient Position: Sitting, Cuff Size: Normal) Comment: Deferred due to audio visit  Pulse 76   Wt 135 lb (61.2 kg)   BMI 22.47 kg/m   Body mass index is 22.47 kg/m.  Advanced Directives 07/14/2019 07/10/2018 05/03/2018 07/09/2017 08/27/2016 07/09/2016  Does Patient Have a Medical Advance Directive? Yes Yes No No Yes Yes  Type of Advance Directive Living will;Out of facility DNR (pink MOST or yellow form) Living will - - Living will Living will  Does patient want to make changes to medical advance directive? No - Patient declined No - Patient declined - - - -  Copy of HPress photographerin Chart? - - - - - No - copy requested  Would patient like information on creating a medical advance directive? - - No - Patient declined No - Patient declined - -    Tobacco Social History   Tobacco Use  Smoking Status Never Smoker  Smokeless Tobacco Never Used     Counseling given: Not Answered   Clinical Intake:  Pre-visit preparation completed: Yes        Diabetes: No  How often do you need to have someone help you when you read instructions, pamphlets, or other written materials from your doctor or pharmacy?: 1 - Never        Past Medical History:  Diagnosis Date  . Cancer ([facility]    Has BRCA 2 gene  . Heart murmur   . Skin cyst 03/28/2018   Past Surgical History:  Procedure Laterality Date  . ABDOMINAL HYSTERECTOMY  2008  . BREAST CYST ASPIRATION Left    negative  . BREAST EXCISIONAL BIOPSY Left    negative x2 done at same  time  . TONSILLECTOMY AND ADENOIDECTOMY    . TUBAL LIGATION     Family History  Problem Relation Age of Onset  . Hyperlipidemia Mother   . Hypertension Mother   . Cancer Daughter        Breast  . Breast cancer Daughter 327 . Breast cancer Paternal Aunt 32 . Breast cancer Maternal Grandmother 479 . Breast cancer Sister 656 . Cancer Paternal Uncle        Prostate  . BRCA 1/2 Other        pt states she is BRCA 2 positive   Social History   Socioeconomic History  . Marital status: Married    Spouse name: Not on file  . Number of children: Not on file  . Years of education: Not on file  . Highest education level: Not on file  Occupational History  . Not on file  Social Needs  . Financial resource strain: Not hard at all  . Food insecurity    Worry: Never true    Inability: Never true  . Transportation needs    Medical: No    Non-medical: No  Tobacco Use  . Smoking status: Never Smoker  . Smokeless tobacco: Never Used  Substance and Sexual Activity  . Alcohol use: No    Alcohol/week: 0.0 standard drinks  .  Drug use: No  . Sexual activity: Never    Partners: Male  Lifestyle  . Physical activity    Days per week: 7 days    Minutes per session: 60 min  . Stress: Not at all  Relationships  . Social Herbalist on phone: Not on file    Gets together: Not on file    Attends religious service: Not on file    Active member of club or organization: Not on file    Attends meetings of clubs or organizations: Not on file    Relationship status: Not on file  Other Topics Concern  . Not on file  Social History Narrative   Retired 4 years ago from [employer] with husband   3 daughters (48, 8, 23)    16 Grandchildren and 1 great grandchild    Enjoys swimming   2 cats and 1 dog- live inside the house     Outpatient Encounter Medications as of 07/14/2019  Medication Sig  . atorvastatin (LIPITOR) 20 MG tablet TAKE 1 TABLET BY MOUTH  DAILY  . Calcium  Carb-Cholecalciferol (CALCIUM 1000 + D PO) Take by mouth.  . esomeprazole (NEXIUM) 40 MG capsule TAKE 1 CAPSULE(40 MG) BY MOUTH DAILY  . hydrochlorothiazide (HYDRODIURIL) 25 MG tablet TAKE 1 TABLET BY MOUTH  DAILY  . lisinopril (ZESTRIL) 20 MG tablet TAKE 1 TABLET BY MOUTH  DAILY  . Multiple Vitamins-Minerals (MULTIVITAMIN ADULT PO) Take by mouth.   No facility-administered encounter medications on file as of 07/14/2019.     Activities of Daily Living In your present state of health, do you have any difficulty performing the following activities: 07/14/2019  Hearing? N  Vision? N  Difficulty concentrating or making decisions? N  Walking or climbing stairs? N  Dressing or bathing? N  Doing errands, shopping? N  Preparing Food and eating ? N  Using the Toilet? N  In the past six months, have you accidently leaked urine? N  Do you have problems with loss of bowel control? N  Managing your Medications? N  Managing your Finances? N  Housekeeping or managing your Housekeeping? N  Some recent data might be hidden    Patient Care Team: Leone Haven, MD as PCP - General (Family Medicine)    Assessment:   This is a routine wellness examination for [place].  I connected with patient 07/14/19 at  9:30 AM EDT by an audio enabled telemedicine application and verified that I am speaking with the correct person using two identifiers. Patient stated full name and DOB. Patient gave permission to continue with virtual visit. Patient's location was at home and Nurse's location was at [place] office.   Health Maintenance Due: -Colonoscopy- she plans to schedule later in the year.    Update all pending maintenance due as appropriate.   See completed HM at the end of note.   Eye: Visual acuity not assessed. Virtual visit. Wears corrective lenses. Followed by their ophthalmologist every 12 months.   Dental: UTD   Hearing: Demonstrates normal hearing during visit.  Safety:  Patient  feels safe at home- yes Patient does have smoke detectors at home- yes Patient does wear sunscreen or protective clothing when in direct sunlight - yes Patient does wear seat belt when in a moving vehicle - yes Patient drives- yes Adequate lighting in walkways free from debris- yes Grab bars and handrails used as appropriate- yes Ambulates with no assistive device Cell phone on  person when ambulating outside of the home- no  Social: Alcohol intake - no       Smoking history- never   Smokers in home? none Illicit drug use? none  Depression: PHQ 2 &9 complete. See screening below. Denies irritability, anhedonia, sadness/tearfullness.   Falls: See screening below.    Medication: Taking as directed and without issues.   Covid-19: Precautions and sickness symptoms discussed. Wears mask, social distancing, hand hygiene as appropriate.   Activities of Daily Living Patient denies needing assistance with: household chores, feeding themselves, getting from bed to chair, getting to the toilet, bathing/showering, dressing, managing money, or preparing meals.   Memory: Patient is alert. Patient denies difficulty focusing or concentrating. Correctly identified the president of the [place], season and recall. 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  BMI- discussed the importance of a healthy diet, water intake and the benefits of aerobic exercise.  Educational material provided.  Physical activity- walking daily 45-90 minutes  Diet: Healthy Water: good intake Caffeine: 2 cups of coffee  Other Providers Patient Care Team: Leone Haven, MD as PCP - General (Family Medicine)  Exercise Activities and Dietary recommendations Current Exercise Habits: Home exercise routine, Type of exercise: walking, Time (Minutes): 60, Frequency (Times/Week): 7, Weekly Exercise (Minutes/Week): 420, Intensity: Moderate  Goals       Patient Stated   . Follow up with Primary Care Provider (pt-stated)     I plan to complete colonoscopy later in the season       Fall Risk Fall Risk  07/14/2019 08/24/2018 07/10/2018 07/09/2017 07/09/2016  Falls in the past year? 0 0 No No No  Number falls in past yr: - 0 - - -  Injury with Fall? - 0 - - -   Timed Get Up and Go performed: no, virtual visit  Depression Screen PHQ 2/9 Scores 07/14/2019 08/24/2018 07/10/2018 07/09/2017  PHQ - 2 Score 0 0 0 0  PHQ- 9 Score - - - 0     Cognitive Function MMSE - Mini Mental State Exam 07/10/2018 07/09/2017 07/09/2016  Orientation to time '5 5 5  ' Orientation to Place '5 5 5  ' Registration '3 3 3  ' Attention/ Calculation '5 5 5  ' Recall '3 3 3  ' Language- name 2 objects '2 2 2  ' Language- repeat '1 1 1  ' Language- follow 3 step command '3 3 3  ' Language- read & follow direction '1 1 1  ' Write a sentence '1 1 1  ' Copy design 1 0 1  Copy design-comments - difficulty copying the design -  Total score '30 29 30     ' 6CIT Screen 07/14/2019  What Year? 0 points  What month? 0 points  What time? 0 points  Count back from 20 0 points  Months in reverse 0 points  Repeat phrase 0 points  Total Score 0    Immunization History  Administered Date(s) Administered  . Fluad Quad(high Dose 65+) 07/12/2019  . Influenza Split 06/23/2014  . Influenza, High Dose Seasonal PF 06/06/2016, 07/09/2017, 07/10/2018  . Influenza-Unspecified 07/06/2015  . Pneumococcal Conjugate-13 11/30/2014  . Pneumococcal Polysaccharide-23 07/09/2016   Screening Tests Health Maintenance  Topic Date Due  . COLONOSCOPY  09/23/2018  . INFLUENZA VACCINE  04/24/2019  . MAMMOGRAM  08/19/2019  . TETANUS/TDAP  10/24/2024  . DEXA SCAN  Completed  . Hepatitis C Screening  Completed  . PNA vac Low Risk Adult  Completed  Plan:   Keep all routine maintenance appointments.   Follow up with your doctor today at 10:00  Medicare Attestation I have personally reviewed: The  patient's medical and social history Their use of alcohol, tobacco or illicit drugs Their current medications and supplements The patient's functional ability including ADLs,fall risks, home safety risks, cognitive, and hearing and visual impairment Diet and physical activities Evidence for depression   In addition, I have reviewed and discussed with patient certain preventive protocols, quality metrics, and best practice recommendations. A written personalized care plan for preventive services as well as general preventive health recommendations were provided to patient via mail.     Varney Biles, LPN  [id number]

## 2019-07-14 NOTE — Assessment & Plan Note (Signed)
Check A1c. 

## 2019-07-14 NOTE — Assessment & Plan Note (Signed)
Adequately controlled.  Continue as needed Nexium.  She will monitor her diet.

## 2019-07-14 NOTE — Patient Instructions (Signed)
  Ms. Spizzirri , Thank you for taking time to come for your Medicare Wellness Visit. I appreciate your ongoing commitment to your health goals. Please review the following plan we discussed and let me know if I can assist you in the future.   These are the goals we discussed: Goals      Patient Stated   . Follow up with Primary Care Provider (pt-stated)     I plan to complete colonoscopy later in the season       This is a list of the screening recommended for you and due dates:  Health Maintenance  Topic Date Due  . Colon Cancer Screening  09/23/2018  . Flu Shot  04/24/2019  . Mammogram  08/19/2019  . Tetanus Vaccine  10/24/2024  . DEXA scan (bone density measurement)  Completed  .  Hepatitis C: One time screening is recommended by Center for Disease Control  (CDC) for  adults born from [birth year] through 1966.   Completed  . Pneumonia vaccines  Completed

## 2019-07-14 NOTE — Assessment & Plan Note (Signed)
Adequately controlled.  Continue current regimen.  Check lab work. 

## 2019-07-14 NOTE — Assessment & Plan Note (Signed)
Patient declines completing this at this time due to the COVID-19 pandemic.  She has the number to call when she is ready to schedule this.

## 2019-07-17 NOTE — Progress Notes (Signed)
I have reviewed the above note and agree.  Zania Kalisz, M.D.  

## 2019-07-21 ENCOUNTER — Other Ambulatory Visit: Payer: Self-pay

## 2019-07-22 ENCOUNTER — Other Ambulatory Visit (INDEPENDENT_AMBULATORY_CARE_PROVIDER_SITE_OTHER): Payer: Medicare Other

## 2019-07-22 DIAGNOSIS — R351 Nocturia: Secondary | ICD-10-CM

## 2019-07-22 DIAGNOSIS — I10 Essential (primary) hypertension: Secondary | ICD-10-CM | POA: Diagnosis not present

## 2019-07-22 DIAGNOSIS — R7303 Prediabetes: Secondary | ICD-10-CM | POA: Diagnosis not present

## 2019-07-22 LAB — POCT URINALYSIS DIPSTICK
Bilirubin, UA: NEGATIVE
Blood, UA: NEGATIVE
Glucose, UA: NEGATIVE
Ketones, UA: NEGATIVE
Nitrite, UA: NEGATIVE
Protein, UA: NEGATIVE
Spec Grav, UA: 1.01 (ref 1.010–1.025)
Urobilinogen, UA: 0.2 E.U./dL
pH, UA: 7 (ref 5.0–8.0)

## 2019-07-22 LAB — HEMOGLOBIN A1C: Hgb A1c MFr Bld: 5.6 % (ref 4.6–6.5)

## 2019-07-22 LAB — BASIC METABOLIC PANEL
BUN: 15 mg/dL (ref 6–23)
CO2: 30 mEq/L (ref 19–32)
Calcium: 9.5 mg/dL (ref 8.4–10.5)
Chloride: 102 mEq/L (ref 96–112)
Creatinine, Ser: 0.69 mg/dL (ref 0.40–1.20)
GFR: 84.19 mL/min (ref >60.00)
Glucose, Bld: 86 mg/dL (ref 70–99)
Potassium: 4.3 mEq/L (ref 3.5–5.1)
Sodium: 139 mEq/L (ref 135–145)

## 2019-07-25 ENCOUNTER — Other Ambulatory Visit: Payer: Self-pay | Admitting: Family Medicine

## 2019-07-25 MED ORDER — MIRABEGRON ER 25 MG PO TB24: 25.0000 mg | ORAL_TABLET | Freq: Every day | ORAL | 3 refills | Status: DC

## 2019-07-28 ENCOUNTER — Telehealth: Payer: Self-pay | Admitting: *Deleted

## 2019-07-28 NOTE — Telephone Encounter (Signed)
Copied from [place] [id number]. Topic: General - Other >> Jul 28, 2019 10:59 AM Carolyn Stare wrote: Pt call to ask if something else can be called in, the below med is to expensive    mirabegron ER (MYRBETRIQ) 25 MG TB24 tablet  Pharmacy [facility] mail order

## 2019-08-03 NOTE — Telephone Encounter (Signed)
We could try oxybutynin though there are more potential side effects with this medication like dry mouth and constipation. If she is ok with this I can send it in for her.

## 2019-08-03 NOTE — Telephone Encounter (Signed)
Pt call to ask if something else can be called in, the below med is to expensive    mirabegron ER (MYRBETRIQ) 25 MG TB24 tablet  Pharmacy [facility] mail order  Deanna Ross,cma

## 2019-08-04 MED ORDER — OXYBUTYNIN CHLORIDE ER 5 MG PO TB24
5.0000 mg | ORAL_TABLET | Freq: Every day | ORAL | 1 refills | Status: DC
Start: 2019-08-04 — End: 2019-09-02

## 2019-08-04 NOTE — Telephone Encounter (Signed)
Sent to pharmacy 

## 2019-08-04 NOTE — Telephone Encounter (Signed)
Patient states she is willing to try the Oxybutynin but she wants it to be sen to [facility] so she can pick it up today, I explained the side effects to the patient and she is ok with that.  Fleming Prill,cma

## 2019-09-02 ENCOUNTER — Other Ambulatory Visit: Payer: Self-pay | Admitting: Family Medicine

## 2019-10-06 ENCOUNTER — Telehealth: Payer: Self-pay

## 2019-10-06 ENCOUNTER — Telehealth: Payer: Self-pay | Admitting: Family Medicine

## 2019-10-06 NOTE — Telephone Encounter (Signed)
I am fine if she continues to take this.  Please ensure that it has been beneficial for her nighttime urination and overactive bladder symptoms.  If it has not been beneficial we could consider increasing the dose.  Please also confirm whether or not she is taking Myrbetriq.  Thanks.

## 2019-10-06 NOTE — Telephone Encounter (Signed)
Pt wanted to know if she should keep taking oxybutynin (DITROPAN-XL) 5 MG 24 hr tablet. If Dr. Caryl Bis does here mail order pharmacy denied it, no more refills.

## 2019-10-07 NOTE — Telephone Encounter (Signed)
Spoke with pt and she stated that she the Oxybutin is helping her with the overactive bladder during the night. She stated that before she was getting up every hour and now she is only getting up 3 or 4 times a night and does not have pressure she was having. She also stated that she is not taking the myrbetriq. Taken out of med list.

## 2019-10-25 LAB — COLOGUARD: Cologuard: NEGATIVE

## 2019-10-28 LAB — COLOGUARD: COLOGUARD: NEGATIVE

## 2020-01-14 ENCOUNTER — Ambulatory Visit (INDEPENDENT_AMBULATORY_CARE_PROVIDER_SITE_OTHER): Payer: Medicare Other | Admitting: Family Medicine

## 2020-01-14 ENCOUNTER — Encounter: Payer: Self-pay | Admitting: Family Medicine

## 2020-01-14 ENCOUNTER — Other Ambulatory Visit: Payer: Self-pay

## 2020-01-14 VITALS — BP 120/80 | HR 61 | Temp 97.7°F | Ht 65.0 in | Wt 140.8 lb

## 2020-01-14 DIAGNOSIS — Z1231 Encounter for screening mammogram for malignant neoplasm of breast: Secondary | ICD-10-CM

## 2020-01-14 DIAGNOSIS — Z1509 Genetic susceptibility to other malignant neoplasm: Secondary | ICD-10-CM

## 2020-01-14 DIAGNOSIS — Z1501 Genetic susceptibility to malignant neoplasm of breast: Secondary | ICD-10-CM

## 2020-01-14 DIAGNOSIS — E785 Hyperlipidemia, unspecified: Secondary | ICD-10-CM

## 2020-01-14 DIAGNOSIS — I10 Essential (primary) hypertension: Secondary | ICD-10-CM | POA: Diagnosis not present

## 2020-01-14 DIAGNOSIS — R7303 Prediabetes: Secondary | ICD-10-CM | POA: Diagnosis not present

## 2020-01-14 LAB — COMPREHENSIVE METABOLIC PANEL
ALT: 15 U/L (ref 0–35)
AST: 17 U/L (ref 0–37)
Albumin: 4.2 g/dL (ref 3.5–5.2)
Alkaline Phosphatase: 46 U/L (ref 39–117)
BUN: 12 mg/dL (ref 6–23)
CO2: 28 mEq/L (ref 19–32)
Calcium: 9.2 mg/dL (ref 8.4–10.5)
Chloride: 100 mEq/L (ref 96–112)
Creatinine, Ser: 0.65 mg/dL (ref 0.40–1.20)
GFR: 90.07 mL/min (ref >60.00)
Glucose, Bld: 83 mg/dL (ref 70–99)
Potassium: 3.8 mEq/L (ref 3.5–5.1)
Sodium: 135 mEq/L (ref 135–145)
Total Bilirubin: 0.7 mg/dL (ref 0.2–1.2)
Total Protein: 6.7 g/dL (ref 6.0–8.3)

## 2020-01-14 LAB — LIPID PANEL
Cholesterol: 138 mg/dL (ref 0–200)
HDL: 56.9 mg/dL (ref >39.00)
LDL Cholesterol: 58 mg/dL (ref 0–99)
NonHDL: 80.84
Total CHOL/HDL Ratio: 2
Triglycerides: 112 mg/dL (ref 0.0–149.0)
VLDL: 22.4 mg/dL (ref 0.0–40.0)

## 2020-01-14 LAB — HEMOGLOBIN A1C: Hgb A1c MFr Bld: 5.6 % (ref 4.6–6.5)

## 2020-01-14 NOTE — Assessment & Plan Note (Signed)
Continue Lipitor.  Check panel.

## 2020-01-14 NOTE — Patient Instructions (Signed)
Nice to see you. Please call to schedule your mammogram. We will contact you with your lab results. 

## 2020-01-14 NOTE — Assessment & Plan Note (Signed)
Check A1c. 

## 2020-01-14 NOTE — Progress Notes (Signed)
  [name], MD Phone: [phone]  Deanna Ross is a 69 y.o. female who presents today for follow-up.  Hyperlipidemia: Taking Lipitor.  No other quadrant pain, myalgias, or claudication.  Hypertension: Typically 124/70.  Taking HCTZ and lisinopril.  No chest pain, shortness of breath, or edema.  BRCA2 positive: Patient is due for mammogram.  She has met with a genetic counselor, Dr. Grayland Ormond, previously to discuss the potential for MRIs.  He did not feel that an MRI was necessary for this patient as long as she had consistent mammograms that were negative.  Social History   Tobacco Use  Smoking Status Never Smoker  Smokeless Tobacco Never Used     ROS see history of present illness  Objective  Physical Exam Vitals:   01/14/20 0908  BP: 120/80  Pulse: 61  Temp: 97.7 F (36.5 C)  SpO2: 99%    BP Readings from Last 3 Encounters:  01/14/20 120/80  07/14/19 131/70  08/24/18 140/90   Wt Readings from Last 3 Encounters:  01/14/20 140 lb 12.8 oz (63.9 kg)  07/14/19 135 lb (61.2 kg)  07/14/19 135 lb (61.2 kg)    Physical Exam Constitutional:      General: She is not in acute distress.    Appearance: She is not diaphoretic.  Cardiovascular:     Rate and Rhythm: Normal rate and regular rhythm.     Heart sounds: Normal heart sounds.  Pulmonary:     Effort: Pulmonary effort is normal.     Breath sounds: Normal breath sounds.  Genitourinary:    Comments: [name], CMA served as chaperone, bilateral breast with no skin changes, nipple inversion, masses, or tenderness, there is a well-healed scar at about 12:00 in the left breast, no axillary masses bilaterally Musculoskeletal:     Right lower leg: No edema.     Left lower leg: No edema.  Skin:    General: Skin is warm and dry.  Neurological:     Mental Status: She is alert.     Assessment/Plan: Please see individual problem list.  HTN (hypertension) Well-controlled.  Continue current regimen.   Check labs.  Prediabetes Check A1c.  Hyperlipidemia Continue Lipitor.  Check panel.  BRCA2 positive Mammogram ordered.  Patient will call to schedule.  Breast exam completed.   Health Maintenance: Patient reports she completed Cologuard.  She notes it was negative.  We have not received these results.  We will contact Cologuard to get these.  Orders Placed This Encounter  Procedures  . MM 3D SCREEN BREAST BILATERAL    Standing Status:   Future    Standing Expiration Date:   03/15/2021    Order Specific Question:   Reason for Exam (SYMPTOM  OR DIAGNOSIS REQUIRED)    Answer:   breast cancer screening    Order Specific Question:   Preferred imaging location?    Answer:   [facility]  . Comp Met (CMET)  . Lipid panel  . HgB A1c    No orders of the defined types were placed in this encounter.   This visit occurred during the SARS-CoV-2 public health emergency.  Safety protocols were in place, including screening questions prior to the visit, additional usage of staff PPE, and extensive cleaning of exam room while observing appropriate contact time as indicated for disinfecting solutions.    [name], MD [place]

## 2020-01-14 NOTE — Assessment & Plan Note (Signed)
Well-controlled.  Continue current regimen.  Check labs.

## 2020-01-14 NOTE — Assessment & Plan Note (Signed)
Mammogram ordered.  Patient will call to schedule.  Breast exam completed.

## 2020-01-24 ENCOUNTER — Ambulatory Visit
Admission: RE | Admit: 2020-01-24 | Discharge: 2020-01-24 | Disposition: A | Discharge status: Home / Self Care | Payer: Medicare Other | Source: Ambulatory Visit | Attending: Family Medicine | Admitting: Family Medicine

## 2020-01-24 DIAGNOSIS — Z1231 Encounter for screening mammogram for malignant neoplasm of breast: Secondary | ICD-10-CM | POA: Insufficient documentation

## 2020-02-10 NOTE — Progress Notes (Signed)
Patient had a cologuard on 10/25/2019, I abstracted it and put it in the lab basket for your review.  Dustee Bottenfield,cma

## 2020-04-25 ENCOUNTER — Other Ambulatory Visit: Payer: Self-pay | Admitting: Family Medicine

## 2020-05-20 ENCOUNTER — Other Ambulatory Visit: Payer: Self-pay | Admitting: Family Medicine

## 2020-07-14 ENCOUNTER — Ambulatory Visit (INDEPENDENT_AMBULATORY_CARE_PROVIDER_SITE_OTHER): Payer: Medicare Other

## 2020-07-14 VITALS — BP 124/74 | Ht 65.0 in | Wt 138.0 lb

## 2020-07-14 DIAGNOSIS — Z0000 Encounter for general adult medical examination without abnormal findings: Secondary | ICD-10-CM

## 2020-07-14 NOTE — Progress Notes (Signed)
Subjective:   Deanna Ross is a 69 y.o. female who presents for Medicare Annual (Subsequent) preventive examination.  Review of Systems    No ROS.  Medicare Wellness Virtual Visit.   Cardiac Risk Factors include: advanced age (>37mn, >>80women);hypertension     Objective:    Today's Vitals   07/14/20 0934  BP: 124/74  Weight: 138 lb (62.6 kg)  Height: '5\' 5"'  (1.651 m)   Body mass index is 22.96 kg/m.  Advanced Directives 07/14/2020 07/14/2019 07/10/2018 05/03/2018 07/09/2017 08/27/2016 07/09/2016  Does Patient Have a Medical Advance Directive? Yes Yes Yes No No Yes Yes  Type of Advance Directive Living will Living will;Out of facility DNR (pink MOST or yellow form) Living will - - Living will Living will  Does patient want to make changes to medical advance directive? No - Patient declined No - Patient declined No - Patient declined - - - -  Copy of HPress photographerin Chart? - - - - - - No - copy requested  Would patient like information on creating a medical advance directive? - - - No - Patient declined No - Patient declined - -    Current Medications (verified) Outpatient Encounter Medications as of 07/14/2020  Medication Sig  . atorvastatin (LIPITOR) 20 MG tablet TAKE 1 TABLET BY MOUTH  DAILY  . esomeprazole (NEXIUM) 40 MG capsule TAKE 1 CAPSULE(40 MG) BY MOUTH DAILY  . hydrochlorothiazide (HYDRODIURIL) 25 MG tablet TAKE 1 TABLET BY MOUTH  DAILY  . lisinopril (ZESTRIL) 20 MG tablet TAKE 1 TABLET BY MOUTH  DAILY  . Multiple Vitamins-Minerals (MULTIVITAMIN ADULT PO) Take by mouth.  . Calcium Carb-Cholecalciferol (CALCIUM 1000 + D PO) Take by mouth. (Patient not taking: Reported on 07/14/2020)  . [DISCONTINUED] oxybutynin (DITROPAN-XL) 5 MG 24 hr tablet TAKE 1 TABLET BY MOUTH AT BEDTIME   No facility-administered encounter medications on file as of 07/14/2020.    Allergies (verified) Gadolinium derivatives, Iodine, and Tdap [tetanus-diphth-acell  pertussis]   History: Past Medical History:  Diagnosis Date  . Cancer ([facility]    Has BRCA 2 gene  . Heart murmur   . Skin cyst 03/28/2018   Past Surgical History:  Procedure Laterality Date  . ABDOMINAL HYSTERECTOMY  2008  . BREAST CYST ASPIRATION Left    negative  . BREAST EXCISIONAL BIOPSY Left    negative x2 done at same time  . TONSILLECTOMY AND ADENOIDECTOMY    . TUBAL LIGATION     Family History  Problem Relation Age of Onset  . Hyperlipidemia Mother   . Hypertension Mother   . Cancer Daughter        Breast  . Breast cancer Daughter 352 . Breast cancer Paternal Aunt 350 . Breast cancer Maternal Grandmother 424 . Breast cancer Sister 672 . Cancer Paternal Uncle        Prostate  . BRCA 1/2 Other        pt states she is BRCA 2 positive   Social History   Socioeconomic History  . Marital status: Married    Spouse name: Not on file  . Number of children: Not on file  . Years of education: Not on file  . Highest education level: Not on file  Occupational History  . Not on file  Tobacco Use  . Smoking status: Never Smoker  . Smokeless tobacco: Never Used  Substance and Sexual Activity  . Alcohol use: No    Alcohol/week: 0.0 standard drinks  .  Drug use: No  . Sexual activity: Never    Partners: Male  Other Topics Concern  . Not on file  Social History Narrative   Retired 4 years ago from [employer] with husband   3 daughters (10, 28, 75)    23 Grandchildren and 1 great grandchild    Enjoys swimming   2 cats and 1 dog- live inside the house    Social Determinants of Radio broadcast assistant Strain:   . Difficulty of Paying Living Expenses: Not on file  Food Insecurity: No Food Insecurity  . Worried About Charity fundraiser in the Last Year: Never true  . Ran Out of Food in the Last Year: Never true  Transportation Needs: No Transportation Needs  . Lack of Transportation (Medical): No  . Lack of Transportation (Non-Medical): No  Physical  Activity: Sufficiently Active  . Days of Exercise per Week: 7 days  . Minutes of Exercise per Session: 60 min  Stress: No Stress Concern Present  . Feeling of Stress : Not at all  Social Connections: Unknown  . Frequency of Communication with Friends and Family: Not on file  . Frequency of Social Gatherings with Friends and Family: More than three times a week  . Attends Religious Services: Not on file  . Active Member of Clubs or Organizations: Not on file  . Attends Archivist Meetings: Not on file  . Marital Status: Married    Tobacco Counseling Counseling given: Not Answered   Clinical Intake:  Pre-visit preparation completed: Yes        Diabetes: No  How often do you need to have someone help you when you read instructions, pamphlets, or other written materials from your doctor or pharmacy?: 1 - Never Interpreter Needed?: No    Activities of Daily Living In your present state of health, do you have any difficulty performing the following activities: 07/14/2020  Hearing? N  Vision? N  Difficulty concentrating or making decisions? N  Walking or climbing stairs? N  Dressing or bathing? N  Doing errands, shopping? N  Preparing Food and eating ? N  Using the Toilet? N  In the past six months, have you accidently leaked urine? N  Do you have problems with loss of bowel control? N  Managing your Medications? N  Managing your Finances? N  Housekeeping or managing your Housekeeping? N  Some recent data might be hidden    Patient Care Team: Deanna Haven, MD as PCP - General (Family Medicine)  Indicate any recent Medical Services you may have received from other than [facility] providers in the past year (date may be approximate).     Assessment:   This is a routine wellness examination for [place].  I connected with Deanna Ross today by telephone and verified that I am speaking with the correct person using two identifiers. Location patient: home Location  provider: work Persons participating in the virtual visit: patient, Marine scientist.    I discussed the limitations, risks, security and privacy concerns of performing an evaluation and management service by telephone and the availability of in person appointments. The patient expressed understanding and verbally consented to this telephonic visit.    Interactive audio and video telecommunications were attempted between this provider and patient, however failed, due to patient having technical difficulties OR patient did not have access to video capability.  We continued and completed visit with audio only.  Some vital signs may be absent or patient  reported.   Hearing/Vision screen  Hearing Screening   '125Hz'  '250Hz'  '500Hz'  '1000Hz'  '2000Hz'  '3000Hz'  '4000Hz'  '6000Hz'  '8000Hz'   Right ear:           Left ear:           Comments: Patient is able to hear conversational tones without difficulty. No issues reported.  Vision Screening Comments: Wears corrective lenses  Visual acuity not assessed, virtual visit.  Dietary issues and exercise activities discussed: Current Exercise Habits: Home exercise routine, Type of exercise: walking, Intensity: Mild  Healthy diet Good water intake  Goals      Patient Stated   .  Follow up with Primary Care Provider (pt-stated)      As needed      Depression Screen PHQ 2/9 Scores 07/14/2020 01/14/2020 07/14/2019 08/24/2018 07/10/2018 07/09/2017 07/09/2016  PHQ - 2 Score 0 0 0 0 0 0 0  PHQ- 9 Score - - - - - 0 -    Fall Risk Fall Risk  07/14/2020 01/14/2020 07/14/2019 08/24/2018 07/10/2018  Falls in the past year? 0 0 0 0 No  Number falls in past yr: 0 0 - 0 -  Injury with Fall? - - - 0 -  Follow up Falls evaluation completed Falls evaluation completed - - -   Handrails in use when climbing stairs? Yes Home free of loose throw rugs in walkways, pet beds, electrical cords, etc? Yes  Adequate lighting in your home to reduce risk of falls? Yes   ASSISTIVE DEVICES UTILIZED  TO PREVENT FALLS: Life alert? No  Use of a cane, walker or w/c? No   TIMED UP AND GO: Was the test performed? No . Virtual visit.   Cognitive Function: Patient is alert and oriented x3. Denies difficulty focusing, making decisions, memory loss.   MMSE - Mini Mental State Exam 07/10/2018 07/09/2017 07/09/2016  Orientation to time '5 5 5  ' Orientation to Place '5 5 5  ' Registration '3 3 3  ' Attention/ Calculation '5 5 5  ' Recall '3 3 3  ' Language- name 2 objects '2 2 2  ' Language- repeat '1 1 1  ' Language- follow 3 step command '3 3 3  ' Language- read & follow direction '1 1 1  ' Write a sentence '1 1 1  ' Copy design 1 0 1  Copy design-comments - difficulty copying the design -  Total score '30 29 30     ' 6CIT Screen 07/14/2019  What Year? 0 points  What month? 0 points  What time? 0 points  Count back from 20 0 points  Months in reverse 0 points  Repeat phrase 0 points  Total Score 0   Immunizations Immunization History  Administered Date(s) Administered  . Fluad Quad(high Dose 65+) 07/12/2019  . Influenza Split 06/23/2014  . Influenza, High Dose Seasonal PF 06/06/2016, 07/09/2017, 07/10/2018  . Influenza-Unspecified 07/06/2015  . PFIZER SARS-COV-2 Vaccination 11/18/2019, 12/09/2019  . Pneumococcal Conjugate-13 11/30/2014  . Pneumococcal Polysaccharide-23 07/09/2016   Health Maintenance Health Maintenance  Topic Date Due  . INFLUENZA VACCINE  04/23/2020  . MAMMOGRAM  01/23/2021  . Fecal DNA (Cologuard)  10/24/2022  . TETANUS/TDAP  10/24/2024  . DEXA SCAN  Completed  . COVID-19 Vaccine  Completed  . Hepatitis C Screening  Completed  . PNA vac Low Risk Adult  Completed   Dental Screening: Recommended annual dental exams for proper oral hygiene  Community Resource Referral / Chronic Care Management: CRR required this visit?  No   CCM required this visit?  No  Plan:   Keep all routine maintenance appointments.   Follow up 07/17/20 @ 9:30  I have personally reviewed  and noted the following in the patient's chart:   . Medical and social history . Use of alcohol, tobacco or illicit drugs  . Current medications and supplements . Functional ability and status . Nutritional status . Physical activity . Advanced directives . List of other physicians . Hospitalizations, surgeries, and ER visits in previous 12 months . Vitals . Screenings to include cognitive, depression, and falls . Referrals and appointments  In addition, I have reviewed and discussed with patient certain preventive protocols, quality metrics, and best practice recommendations. A written personalized care plan for preventive services as well as general preventive health recommendations were provided to patient via mychart.     Varney Biles, LPN   [id number]

## 2020-07-14 NOTE — Patient Instructions (Addendum)
[name] , Thank you for taking time to come for your Medicare Wellness Visit. I appreciate your ongoing commitment to your health goals. Please review the following plan we discussed and let me know if I can assist you in the future.   These are the goals we discussed: Goals      Patient Stated   .  Follow up with Primary Care Provider (pt-stated)      As needed       This is a list of the screening recommended for you and due dates:  Health Maintenance  Topic Date Due  . Flu Shot  04/23/2020  . Mammogram  01/23/2021  . Cologuard (Stool DNA test)  10/24/2022  . Tetanus Vaccine  10/24/2024  . DEXA scan (bone density measurement)  Completed  . COVID-19 Vaccine  Completed  .  Hepatitis C: One time screening is recommended by Center for Disease Control  (CDC) for  adults born from [birth year] through 1966.   Completed  . Pneumonia vaccines  Completed    Immunizations Immunization History  Administered Date(s) Administered  . Fluad Quad(high Dose 65+) 07/12/2019  . Influenza Split 06/23/2014  . Influenza, High Dose Seasonal PF 06/06/2016, 07/09/2017, 07/10/2018  . Influenza-Unspecified 07/06/2015  . PFIZER SARS-COV-2 Vaccination 11/18/2019, 12/09/2019  . Pneumococcal Conjugate-13 11/30/2014  . Pneumococcal Polysaccharide-23 07/09/2016   Keep all routine maintenance appointments.   Follow up 07/17/20 @ 9:30  Advanced directives: End of life planning; Advance aging; Advanced directives discussed.  Copy of current HCPOA/Living Will requested.    Conditions/risks identified: none new.   Follow up in one year for your annual wellness visit.   Preventive Care 28 Years and Older, Female Preventive care refers to lifestyle choices and visits with your health care provider that can promote health and wellness. What does preventive care include?  A yearly physical exam. This is also called an annual well check.  Dental exams once or twice a year.  Routine eye exams. Ask your  health care provider how often you should have your eyes checked.  Personal lifestyle choices, including:  Daily care of your teeth and gums.  Regular physical activity.  Eating a healthy diet.  Avoiding tobacco and drug use.  Limiting alcohol use.  Practicing safe sex.  Taking low-dose aspirin every day.  Taking vitamin and mineral supplements as recommended by your health care provider. What happens during an annual well check? The services and screenings done by your health care provider during your annual well check will depend on your age, overall health, lifestyle risk factors, and family history of disease. Counseling  Your health care provider may ask you questions about your:  Alcohol use.  Tobacco use.  Drug use.  Emotional well-being.  Home and relationship well-being.  Sexual activity.  Eating habits.  History of falls.  Memory and ability to understand (cognition).  Work and work Statistician.  Reproductive health. Screening  You may have the following tests or measurements:  Height, weight, and BMI.  Blood pressure.  Lipid and cholesterol levels. These may be checked every 5 years, or more frequently if you are over 2 years old.  Skin check.  Lung cancer screening. You may have this screening every year starting at age 29 if you have a 30-pack-year history of smoking and currently smoke or have quit within the past 15 years.  Fecal occult blood test (FOBT) of the stool. You may have this test every year starting at age 10.  Flexible sigmoidoscopy  or colonoscopy. You may have a sigmoidoscopy every 5 years or a colonoscopy every 10 years starting at age 70.  Hepatitis C blood test.  Hepatitis B blood test.  Sexually transmitted disease (STD) testing.  Diabetes screening. This is done by checking your blood sugar (glucose) after you have not eaten for a while (fasting). You may have this done every 1-3 years.  Bone density scan. This is  done to screen for osteoporosis. You may have this done starting at age 75.  Mammogram. This may be done every 1-2 years. Talk to your health care provider about how often you should have regular mammograms. Talk with your health care provider about your test results, treatment options, and if necessary, the need for more tests. Vaccines  Your health care provider may recommend certain vaccines, such as:  Influenza vaccine. This is recommended every year.  Tetanus, diphtheria, and acellular pertussis (Tdap, Td) vaccine. You may need a Td booster every 10 years.  Zoster vaccine. You may need this after age 41.  Pneumococcal 13-valent conjugate (PCV13) vaccine. One dose is recommended after age 16.  Pneumococcal polysaccharide (PPSV23) vaccine. One dose is recommended after age 37. Talk to your health care provider about which screenings and vaccines you need and how often you need them. This information is not intended to replace advice given to you by your health care provider. Make sure you discuss any questions you have with your health care provider. Document Released: 10/06/2015 Document Revised: 05/29/2016 Document Reviewed: 07/11/2015 Elsevier Interactive Patient Education  2017 [place] Prevention in the Home Falls can cause injuries. They can happen to people of all ages. There are many things you can do to make your home safe and to help prevent falls. What can I do on the outside of my home?  Regularly fix the edges of walkways and driveways and fix any cracks.  Remove anything that might make you trip as you walk through a door, such as a raised step or threshold.  Trim any bushes or trees on the path to your home.  Use bright outdoor lighting.  Clear any walking paths of anything that might make someone trip, such as rocks or tools.  Regularly check to see if handrails are loose or broken. Make sure that both sides of any steps have handrails.  Any raised  decks and porches should have guardrails on the edges.  Have any leaves, snow, or ice cleared regularly.  Use sand or salt on walking paths during winter.  Clean up any spills in your garage right away. This includes oil or grease spills. What can I do in the bathroom?  Use night lights.  Install grab bars by the toilet and in the tub and shower. Do not use towel bars as grab bars.  Use non-skid mats or decals in the tub or shower.  If you need to sit down in the shower, use a plastic, non-slip stool.  Keep the floor dry. Clean up any water that spills on the floor as soon as it happens.  Remove soap buildup in the tub or shower regularly.  Attach bath mats securely with double-sided non-slip rug tape.  Do not have throw rugs and other things on the floor that can make you trip. What can I do in the bedroom?  Use night lights.  Make sure that you have a light by your bed that is easy to reach.  Do not use any sheets or blankets that are  too big for your bed. They should not hang down onto the floor.  Have a firm chair that has side arms. You can use this for support while you get dressed.  Do not have throw rugs and other things on the floor that can make you trip. What can I do in the kitchen?  Clean up any spills right away.  Avoid walking on wet floors.  Keep items that you use a lot in easy-to-reach places.  If you need to reach something above you, use a strong step stool that has a grab bar.  Keep electrical cords out of the way.  Do not use floor polish or wax that makes floors slippery. If you must use wax, use non-skid floor wax.  Do not have throw rugs and other things on the floor that can make you trip. What can I do with my stairs?  Do not leave any items on the stairs.  Make sure that there are handrails on both sides of the stairs and use them. Fix handrails that are broken or loose. Make sure that handrails are as long as the stairways.  Check  any carpeting to make sure that it is firmly attached to the stairs. Fix any carpet that is loose or worn.  Avoid having throw rugs at the top or bottom of the stairs. If you do have throw rugs, attach them to the floor with carpet tape.  Make sure that you have a light switch at the top of the stairs and the bottom of the stairs. If you do not have them, ask someone to add them for you. What else can I do to help prevent falls?  Wear shoes that:  Do not have high heels.  Have rubber bottoms.  Are comfortable and fit you well.  Are closed at the toe. Do not wear sandals.  If you use a stepladder:  Make sure that it is fully opened. Do not climb a closed stepladder.  Make sure that both sides of the stepladder are locked into place.  Ask someone to hold it for you, if possible.  Clearly mark and make sure that you can see:  Any grab bars or handrails.  First and last steps.  Where the edge of each step is.  Use tools that help you move around (mobility aids) if they are needed. These include:  Canes.  Walkers.  Scooters.  Crutches.  Turn on the lights when you go into a dark area. Replace any light bulbs as soon as they burn out.  Set up your furniture so you have a clear path. Avoid moving your furniture around.  If any of your floors are uneven, fix them.  If there are any pets around you, be aware of where they are.  Review your medicines with your doctor. Some medicines can make you feel dizzy. This can increase your chance of falling. Ask your doctor what other things that you can do to help prevent falls. This information is not intended to replace advice given to you by your health care provider. Make sure you discuss any questions you have with your health care provider. Document Released: 07/06/2009 Document Revised: 02/15/2016 Document Reviewed: 10/14/2014 Elsevier Interactive Patient Education  2017 [employer].

## 2020-07-17 ENCOUNTER — Encounter: Payer: Self-pay | Admitting: Family Medicine

## 2020-07-17 ENCOUNTER — Ambulatory Visit (INDEPENDENT_AMBULATORY_CARE_PROVIDER_SITE_OTHER): Payer: Medicare Other | Admitting: Family Medicine

## 2020-07-17 ENCOUNTER — Other Ambulatory Visit: Payer: Self-pay

## 2020-07-17 VITALS — BP 121/80 | HR 61 | Temp 98.1°F | Ht 65.0 in | Wt 140.4 lb

## 2020-07-17 DIAGNOSIS — I10 Essential (primary) hypertension: Secondary | ICD-10-CM | POA: Diagnosis not present

## 2020-07-17 DIAGNOSIS — K219 Gastro-esophageal reflux disease without esophagitis: Secondary | ICD-10-CM | POA: Diagnosis not present

## 2020-07-17 DIAGNOSIS — Z1501 Genetic susceptibility to malignant neoplasm of breast: Secondary | ICD-10-CM | POA: Diagnosis not present

## 2020-07-17 DIAGNOSIS — E785 Hyperlipidemia, unspecified: Secondary | ICD-10-CM | POA: Diagnosis not present

## 2020-07-17 DIAGNOSIS — Z1509 Genetic susceptibility to other malignant neoplasm: Secondary | ICD-10-CM

## 2020-07-17 DIAGNOSIS — Z23 Encounter for immunization: Secondary | ICD-10-CM | POA: Diagnosis not present

## 2020-07-17 LAB — BASIC METABOLIC PANEL
BUN: 13 mg/dL (ref 6–23)
CO2: 29 mEq/L (ref 19–32)
Calcium: 9.7 mg/dL (ref 8.4–10.5)
Chloride: 99 mEq/L (ref 96–112)
Creatinine, Ser: 0.69 mg/dL (ref 0.40–1.20)
GFR: 87.79 mL/min (ref >60.00)
Glucose, Bld: 103 mg/dL — ABNORMAL HIGH (ref 70–99)
Potassium: 4.3 mEq/L (ref 3.5–5.1)
Sodium: 137 mEq/L (ref 135–145)

## 2020-07-17 NOTE — Assessment & Plan Note (Signed)
Adequately controlled.  Continue HCTZ 25 mg daily and lisinopril 20 mg daily.  Check BMP.

## 2020-07-17 NOTE — Assessment & Plan Note (Signed)
Continue Lipitor 20 mg once daily. 

## 2020-07-17 NOTE — Assessment & Plan Note (Signed)
Only occasional symptoms.  Treated well with over-the-counter Prevacid prior to trigger foods.  She can continue this.

## 2020-07-17 NOTE — Progress Notes (Signed)
[name], MD Phone: [phone]  Deanna Ross is a 69 y.o. female who presents today for f/u.  HYPERTENSION  Disease Monitoring  Home BP Monitoring 120s/70s Chest pain- no    Dyspnea- no Medications  Compliance-  Taking HCTZ, lisinopril.  Edema- no  HYPERLIPIDEMIA Symptoms Chest pain on exertion:  no   Medications: Compliance- taking lipitor Right upper quadrant pain- no  Muscle aches- no  GERD:   Reflux symptoms: only if she eats specific foods like tomato sauce   Abd pain: no   Blood in stool: no  Dysphagia: no    Medication: prevacid prior to trigger foods Much improved after weight loss.   BRCA2 mutation: Patient notes no lumps in her breast.  She occasionally does a self breast exam.  Mammogram in May was acceptable.   Social History   Tobacco Use  Smoking Status Never Smoker  Smokeless Tobacco Never Used     ROS see history of present illness  Objective  Physical Exam Vitals:   07/17/20 0949  BP: 121/80  Pulse: 61  Temp: 98.1 F (36.7 C)  SpO2: 99%    BP Readings from Last 3 Encounters:  07/17/20 121/80  07/14/20 124/74  01/14/20 120/80   Wt Readings from Last 3 Encounters:  07/17/20 140 lb 6.4 oz (63.7 kg)  07/14/20 138 lb (62.6 kg)  01/14/20 140 lb 12.8 oz (63.9 kg)    Physical Exam Constitutional:      General: She is not in acute distress.    Appearance: She is not diaphoretic.  Cardiovascular:     Rate and Rhythm: Normal rate and regular rhythm.     Heart sounds: Normal heart sounds.  Pulmonary:     Effort: Pulmonary effort is normal.     Breath sounds: Normal breath sounds.  Musculoskeletal:     Right lower leg: No edema.     Left lower leg: No edema.  Skin:    General: Skin is warm and dry.  Neurological:     Mental Status: She is alert.      Assessment/Plan: Please see individual problem list.  Problem List Items Addressed This Visit    BRCA2 positive    Mammogram this year was reassuring.  Discussed  complaining of breast exam every 6 months though she declines this today.  Plan on breast exam and mammogram to be ordered at next visit.  Previously saw genetic counselor who discussed that MRI for breast cancer screening was not necessary for her as long as she had consistent negative mammograms.      GERD (gastroesophageal reflux disease)    Only occasional symptoms.  Treated well with over-the-counter Prevacid prior to trigger foods.  She can continue this.      HTN (hypertension)    Adequately controlled.  Continue HCTZ 25 mg daily and lisinopril 20 mg daily.  Check BMP.      Relevant Orders   Basic Metabolic Panel (BMET)   Hyperlipidemia    Continue Lipitor 20 mg once daily.       Other Visit Diagnoses    Need for immunization against influenza    -  Primary   Relevant Orders   Flu Vaccine QUAD High Dose(Fluad) (Completed)       This visit occurred during the SARS-CoV-2 public health emergency.  Safety protocols were in place, including screening questions prior to the visit, additional usage of staff PPE, and extensive cleaning of exam room while observing appropriate contact time as indicated for disinfecting  solutions.    [name], MD [place]

## 2020-07-17 NOTE — Patient Instructions (Signed)
Nice to see you. We will get labs today and contact you with the results.  

## 2020-07-17 NOTE — Assessment & Plan Note (Signed)
Mammogram this year was reassuring.  Discussed complaining of breast exam every 6 months though she declines this today.  Plan on breast exam and mammogram to be ordered at next visit.  Previously saw genetic counselor who discussed that MRI for breast cancer screening was not necessary for her as long as she had consistent negative mammograms.

## 2020-11-01 DIAGNOSIS — Z20822 Contact with and (suspected) exposure to covid-19: Secondary | ICD-10-CM | POA: Diagnosis not present

## 2020-11-01 DIAGNOSIS — Z03818 Encounter for observation for suspected exposure to other biological agents ruled out: Secondary | ICD-10-CM | POA: Diagnosis not present

## 2021-01-15 ENCOUNTER — Other Ambulatory Visit: Payer: Self-pay

## 2021-01-15 ENCOUNTER — Encounter: Payer: Self-pay | Admitting: Family Medicine

## 2021-01-15 ENCOUNTER — Ambulatory Visit (INDEPENDENT_AMBULATORY_CARE_PROVIDER_SITE_OTHER): Payer: Medicare Other | Admitting: Family Medicine

## 2021-01-15 VITALS — BP 134/78 | HR 56 | Temp 97.6°F | Ht 65.0 in | Wt 142.8 lb

## 2021-01-15 DIAGNOSIS — L821 Other seborrheic keratosis: Secondary | ICD-10-CM | POA: Diagnosis not present

## 2021-01-15 DIAGNOSIS — I10 Essential (primary) hypertension: Secondary | ICD-10-CM

## 2021-01-15 DIAGNOSIS — R7303 Prediabetes: Secondary | ICD-10-CM

## 2021-01-15 DIAGNOSIS — E785 Hyperlipidemia, unspecified: Secondary | ICD-10-CM

## 2021-01-15 DIAGNOSIS — K219 Gastro-esophageal reflux disease without esophagitis: Secondary | ICD-10-CM | POA: Diagnosis not present

## 2021-01-15 DIAGNOSIS — Z1509 Genetic susceptibility to other malignant neoplasm: Secondary | ICD-10-CM

## 2021-01-15 DIAGNOSIS — Z1501 Genetic susceptibility to malignant neoplasm of breast: Secondary | ICD-10-CM | POA: Diagnosis not present

## 2021-01-15 DIAGNOSIS — Z1231 Encounter for screening mammogram for malignant neoplasm of breast: Secondary | ICD-10-CM | POA: Diagnosis not present

## 2021-01-15 LAB — COMPREHENSIVE METABOLIC PANEL
ALT: 14 U/L (ref 0–35)
AST: 14 U/L (ref 0–37)
Albumin: 4.1 g/dL (ref 3.5–5.2)
Alkaline Phosphatase: 60 U/L (ref 39–117)
BUN: 14 mg/dL (ref 6–23)
CO2: 30 mEq/L (ref 19–32)
Calcium: 9.6 mg/dL (ref 8.4–10.5)
Chloride: 102 mEq/L (ref 96–112)
Creatinine, Ser: 0.67 mg/dL (ref 0.40–1.20)
GFR: 88.1 mL/min (ref >60.00)
Glucose, Bld: 91 mg/dL (ref 70–99)
Potassium: 3.8 mEq/L (ref 3.5–5.1)
Sodium: 139 mEq/L (ref 135–145)
Total Bilirubin: 0.5 mg/dL (ref 0.2–1.2)
Total Protein: 6.7 g/dL (ref 6.0–8.3)

## 2021-01-15 LAB — LIPID PANEL
Cholesterol: 133 mg/dL (ref 0–200)
HDL: 57.7 mg/dL (ref >39.00)
LDL Cholesterol: 53 mg/dL (ref 0–99)
NonHDL: 75.42
Total CHOL/HDL Ratio: 2
Triglycerides: 114 mg/dL (ref 0.0–149.0)
VLDL: 22.8 mg/dL (ref 0.0–40.0)

## 2021-01-15 LAB — HEMOGLOBIN A1C: Hgb A1c MFr Bld: 5.6 % (ref 4.6–6.5)

## 2021-01-15 MED ORDER — ESOMEPRAZOLE MAGNESIUM 20 MG PO CPDR: 20.0000 mg | DELAYED_RELEASE_CAPSULE | ORAL | 1 refills | Status: DC

## 2021-01-15 NOTE — Assessment & Plan Note (Signed)
Stable.  She will continue Nexium 40 mg once daily.  Prescription sent to pharmacy.

## 2021-01-15 NOTE — Patient Instructions (Addendum)
Nice to see you. Please call [phone] to schedule your mammogram. We will contact you with your lab results. Please start checking your blood pressure again.  If it starts to run greater than 130/80 consistently please let me know.

## 2021-01-15 NOTE — Assessment & Plan Note (Addendum)
Breast exam completed.  Mammogram ordered.  Patient will call to schedule. Patient saw the genetic counselor, Alyssa Grove MD, on 08/27/2016 regarding this.  He recommended continuing yearly mammograms.  He felt that MRIs would not provide any additional benefit.

## 2021-01-15 NOTE — Assessment & Plan Note (Signed)
Check A1c. 

## 2021-01-15 NOTE — Progress Notes (Signed)
Deanna Rumps, MD Phone: [phone]  Deanna Ross is a 70 y.o. female who presents today for f/u.  HYPERTENSION  Disease Monitoring  Home BP Monitoring not checking as her husband misplaced the blood pressure cuff chest pain- no    Dyspnea- no Medications  Compliance-  Taking lisinopril, HCTZ.   Edema- no  HYPERLIPIDEMIA Symptoms Chest pain on exertion:  no    Medications: Compliance- taking lipitor Right upper quadrant pain- no  Muscle aches- no  GERD:  notes she has had reflux issues her entire life Reflux symptoms: only if she does not take her nexium 3 times per week   Abd pain: no   Blood in stool: no  Dysphagia: no   EGD: no prior history  Medication: nexium three times per week  BRCA2 positive: Patient is due for breast exam and mammogram.  Skin lesion: This is on her left upper anterior shoulder.  She notes this gets irritated by her bra strap and her bathing suit.  She wonders if she should get this removed by dermatology.  Social History   Tobacco Use  Smoking Status Never Smoker  Smokeless Tobacco Never Used    Current Outpatient Medications on File Prior to Visit  Medication Sig Dispense Refill  . atorvastatin (LIPITOR) 20 MG tablet TAKE 1 TABLET BY MOUTH  DAILY 90 tablet 3  . hydrochlorothiazide (HYDRODIURIL) 25 MG tablet TAKE 1 TABLET BY MOUTH  DAILY 90 tablet 3  . lisinopril (ZESTRIL) 20 MG tablet TAKE 1 TABLET BY MOUTH  DAILY 90 tablet 3  . Multiple Vitamins-Minerals (MULTIVITAMIN ADULT PO) Take by mouth.     No current facility-administered medications on file prior to visit.     ROS see history of present illness  Objective  Physical Exam Vitals:   01/15/21 0810  BP: 134/78  Pulse: (!) 56  Temp: 97.6 F (36.4 C)  SpO2: 97%    BP Readings from Last 3 Encounters:  01/15/21 134/78  07/17/20 121/80  07/14/20 124/74   Wt Readings from Last 3 Encounters:  01/15/21 142 lb 12.8 oz (64.8 kg)  07/17/20 140 lb 6.4 oz (63.7 kg)   07/14/20 138 lb (62.6 kg)    Physical Exam Constitutional:      General: She is not in acute distress.    Appearance: She is not diaphoretic.  Cardiovascular:     Rate and Rhythm: Normal rate and regular rhythm.     Heart sounds: Normal heart sounds.  Pulmonary:     Effort: Pulmonary effort is normal.     Breath sounds: Normal breath sounds.  Chest:       Comments: Thayer Jew, CMA served as chaperone, bilateral breast with no nipple inversion, masses, skin changes, or tenderness, no axillary masses bilaterally Skin:    General: Skin is warm and dry.       Neurological:     Mental Status: She is alert.      Assessment/Plan: Please see individual problem list.  Problem List Items Addressed This Visit    BRCA2 positive    Breast exam completed.  Mammogram ordered.  Patient will call to schedule. Patient saw the genetic counselor, Alyssa Grove MD, on 08/27/2016 regarding this.  He recommended continuing yearly mammograms.  He felt that MRIs would not provide any additional benefit.       HTN (hypertension)    Slightly above goal today though has been well controlled in the past.  She will start checking at home and let us know  if it is running higher than 130/80 consistently.      Relevant Orders   Comp Met (CMET)   Hyperlipidemia    Check lipid panel.  Continue Lipitor 20 mg once daily.      Relevant Orders   Comp Met (CMET)   Lipid panel   GERD (gastroesophageal reflux disease)    Stable.  She will continue Nexium 40 mg once daily.  Prescription sent to pharmacy.      Relevant Medications   esomeprazole (NEXIUM) 20 MG capsule   Seborrheic keratosis    Discussed that this appears to be a benign lesion called a seborrheic keratosis.  I encouraged her to have her dermatologist look at this to consider removal given that it intermittently gets irritated.      Prediabetes    Check A1c.      Relevant Orders   HgB A1c    Other Visit Diagnoses    Encounter  for screening mammogram for malignant neoplasm of breast    -  Primary   Relevant Orders   MM 3D SCREEN BREAST BILATERAL      This visit occurred during the SARS-CoV-2 public health emergency.  Safety protocols were in place, including screening questions prior to the visit, additional usage of staff PPE, and extensive cleaning of exam room while observing appropriate contact time as indicated for disinfecting solutions.    Deanna Rumps, MD [place]

## 2021-01-15 NOTE — Assessment & Plan Note (Signed)
Slightly above goal today though has been well controlled in the past.  She will start checking at home and let us know if it is running higher than 130/80 consistently.

## 2021-01-15 NOTE — Assessment & Plan Note (Signed)
Discussed that this appears to be a benign lesion called a seborrheic keratosis.  I encouraged her to have her dermatologist look at this to consider removal given that it intermittently gets irritated.

## 2021-01-15 NOTE — Assessment & Plan Note (Signed)
Check lipid panel.  Continue Lipitor 20 mg once daily. 

## 2021-01-24 ENCOUNTER — Ambulatory Visit
Admission: RE | Admit: 2021-01-24 | Discharge: 2021-01-24 | Disposition: A | Discharge status: Home / Self Care | Payer: Medicare Other | Source: Ambulatory Visit | Attending: Family Medicine | Admitting: Family Medicine

## 2021-01-24 ENCOUNTER — Other Ambulatory Visit: Payer: Self-pay

## 2021-01-24 DIAGNOSIS — Z1231 Encounter for screening mammogram for malignant neoplasm of breast: Secondary | ICD-10-CM | POA: Insufficient documentation

## 2021-03-15 ENCOUNTER — Other Ambulatory Visit: Payer: Self-pay | Admitting: Family Medicine

## 2021-03-21 ENCOUNTER — Telehealth: Payer: Self-pay | Admitting: Family Medicine

## 2021-03-21 NOTE — Telephone Encounter (Signed)
Patient stated her knee is hurting pretty bad and it feels like its fluid on it and she wants to know where you could send her for her knee.  Rollin Kotowski,cma

## 2021-03-21 NOTE — Telephone Encounter (Signed)
I called the patient and informed her that the provider feels she needs to see [facility] they have a urgent care walkin and she agreed to go see them.  Mohammad Granade,cma

## 2021-03-21 NOTE — Telephone Encounter (Signed)
PT called to see who Dr.Sonnenberg would refer them to for problems with knee.

## 2021-03-21 NOTE — Telephone Encounter (Signed)
If her knee is bothering her that badly she could go to [facility] to their walk in urgent clinic in [place]. They should be able to evaluate this issue.

## 2021-04-19 ENCOUNTER — Emergency Department: Payer: Medicare Other

## 2021-04-19 ENCOUNTER — Other Ambulatory Visit: Payer: Self-pay

## 2021-04-19 ENCOUNTER — Encounter: Payer: Self-pay | Admitting: Emergency Medicine

## 2021-04-19 ENCOUNTER — Emergency Department
Admission: EM | Admit: 2021-04-19 | Discharge: 2021-04-19 | Disposition: A | Discharge status: Home / Self Care | Payer: Medicare Other | Attending: Emergency Medicine | Admitting: Emergency Medicine

## 2021-04-19 DIAGNOSIS — I10 Essential (primary) hypertension: Secondary | ICD-10-CM | POA: Diagnosis not present

## 2021-04-19 DIAGNOSIS — S0181XA Laceration without foreign body of other part of head, initial encounter: Secondary | ICD-10-CM | POA: Insufficient documentation

## 2021-04-19 DIAGNOSIS — Z853 Personal history of malignant neoplasm of breast: Secondary | ICD-10-CM | POA: Diagnosis not present

## 2021-04-19 DIAGNOSIS — R519 Headache, unspecified: Secondary | ICD-10-CM | POA: Diagnosis not present

## 2021-04-19 DIAGNOSIS — W01198A Fall on same level from slipping, tripping and stumbling with subsequent striking against other object, initial encounter: Secondary | ICD-10-CM | POA: Diagnosis not present

## 2021-04-19 DIAGNOSIS — M47812 Spondylosis without myelopathy or radiculopathy, cervical region: Secondary | ICD-10-CM | POA: Diagnosis not present

## 2021-04-19 DIAGNOSIS — S0990XA Unspecified injury of head, initial encounter: Secondary | ICD-10-CM | POA: Diagnosis present

## 2021-04-19 DIAGNOSIS — Z043 Encounter for examination and observation following other accident: Secondary | ICD-10-CM | POA: Diagnosis not present

## 2021-04-19 DIAGNOSIS — Z79899 Other long term (current) drug therapy: Secondary | ICD-10-CM | POA: Diagnosis not present

## 2021-04-19 DIAGNOSIS — M4802 Spinal stenosis, cervical region: Secondary | ICD-10-CM | POA: Diagnosis not present

## 2021-04-19 DIAGNOSIS — W19XXXA Unspecified fall, initial encounter: Secondary | ICD-10-CM

## 2021-04-19 DIAGNOSIS — R42 Dizziness and giddiness: Secondary | ICD-10-CM

## 2021-04-19 LAB — CBC
HCT: 38.4 % (ref 36.0–46.0)
Hemoglobin: 13 g/dL (ref 12.0–15.0)
MCH: 31 pg (ref 26.0–34.0)
MCHC: 33.9 g/dL (ref 30.0–36.0)
MCV: 91.4 fL (ref 80.0–100.0)
Platelets: 270 10*3/uL (ref 150–400)
RBC: 4.2 MIL/uL (ref 3.87–5.11)
RDW: 12.4 % (ref 11.5–15.5)
WBC: 10.1 10*3/uL (ref 4.0–10.5)
nRBC: 0 % (ref 0.0–0.2)

## 2021-04-19 LAB — BASIC METABOLIC PANEL
Anion gap: 9 (ref 5–15)
BUN: 21 mg/dL (ref 8–23)
CO2: 26 mmol/L (ref 22–32)
Calcium: 8.8 mg/dL — ABNORMAL LOW (ref 8.9–10.3)
Chloride: 97 mmol/L — ABNORMAL LOW (ref 98–111)
Creatinine, Ser: 0.88 mg/dL (ref 0.44–1.00)
GFR, Estimated: >60 mL/min (ref >60)
Glucose, Bld: 145 mg/dL — ABNORMAL HIGH (ref 70–99)
Potassium: 3.7 mmol/L (ref 3.5–5.1)
Sodium: 132 mmol/L — ABNORMAL LOW (ref 135–145)

## 2021-04-19 LAB — TROPONIN I (HIGH SENSITIVITY)
Troponin I (High Sensitivity): 4 ng/L (ref <18)
Troponin I (High Sensitivity): 3 ng/L (ref <18)

## 2021-04-19 MED ORDER — LIDOCAINE-EPINEPHRINE 2 %-1:100000 IJ SOLN
20.0000 mL | Freq: Once | INTRAMUSCULAR | Status: AC
  Administered 2021-04-19: 20 mL via INTRADERMAL
  Filled 2021-04-19: qty 1

## 2021-04-19 MED ORDER — CEPHALEXIN 500 MG PO CAPS: 500.0000 mg | ORAL_CAPSULE | Freq: Two times a day (BID) | ORAL | 0 refills | Status: AC

## 2021-04-19 MED ORDER — CEPHALEXIN 500 MG PO CAPS: 500.0000 mg | ORAL_CAPSULE | Freq: Two times a day (BID) | ORAL | 0 refills | Status: DC

## 2021-04-19 NOTE — ED Provider Notes (Signed)
[facility] Emergency Department Provider Note  ____________________________________________   Event Date/Time   First MD Initiated Contact with Patient 04/19/21 0802     (approximate)  I have reviewed the triage vital signs and the nursing notes.   HISTORY  Chief Complaint Dizziness    HPI Deanna Ross is a 70 y.o. female here with fall and head injury.  The patient states that she got up to go to the restroom middle the night.  When she stood up, she got lightheaded, causing her to fall.  She struck her head on the corner of a nearby furniture piece.  She reports that she does not believe she lost consciousness.  She felt lightheaded and dizzy at the time but this is now resolved.  She has had similar episodes, but the most recent was several years ago.  Denies any preceding or recent illnesses.  No chest pain or shortness of breath.  No palpitations.  No recent medication changes.  She does state that she got her COVID booster several days ago and had been feeling unwell for about 48 hours after that.  She is had no fevers in the last 24 hours.  No other complaints.    Past Medical History:  Diagnosis Date   Cancer [facility])    Has BRCA 2 gene   Heart murmur    Skin cyst 03/28/2018    Patient Active Problem List   Diagnosis Date Noted   Nocturia 07/14/2019   Colon cancer screening 02/24/2019   Prediabetes 08/24/2018   Dog bite of hand 08/24/2018   Seborrheic keratosis 02/18/2018   Muscle cramp 02/18/2018   Pedal edema 02/03/2017   Osteopenia 06/06/2016   Actinic keratosis 06/06/2016   GERD (gastroesophageal reflux disease) 06/06/2016   Rash and nonspecific skin eruption 01/22/2016   Left knee pain 12/09/2015   Hyperlipidemia 11/30/2015   BRCA2 positive 01/24/2015   HTN (hypertension) 01/24/2015    Past Surgical History:  Procedure Laterality Date   ABDOMINAL HYSTERECTOMY  2008   BREAST CYST ASPIRATION Left    negative   BREAST  EXCISIONAL BIOPSY Left    negative x2 done at same time   [place]      Prior to Admission medications   Medication Sig Start Date End Date Taking? Authorizing Provider  atorvastatin (LIPITOR) 20 MG tablet TAKE 1 TABLET BY MOUTH  DAILY 03/16/21   Leone Haven, MD  esomeprazole (NEXIUM) 20 MG capsule Take 1 capsule (20 mg total) by mouth 3 (three) times a week. Take before breakfast 01/15/21   Leone Haven, MD  hydrochlorothiazide (HYDRODIURIL) 25 MG tablet TAKE 1 TABLET BY MOUTH  DAILY 03/16/21   Leone Haven, MD  lisinopril (ZESTRIL) 20 MG tablet TAKE 1 TABLET BY MOUTH  DAILY 03/16/21   Leone Haven, MD  Multiple Vitamins-Minerals (MULTIVITAMIN ADULT PO) Take by mouth.    [provider]    Allergies Gadolinium derivatives, Iodine, and Tdap [tetanus-diphth-acell pertussis]  Family History  Problem Relation Age of Onset   Hyperlipidemia Mother    Hypertension Mother    Cancer Daughter        Breast   Breast cancer Daughter 31   Breast cancer Paternal Aunt 12   Breast cancer Maternal Grandmother 13   Breast cancer Sister 25   Cancer Paternal Uncle        Prostate   BRCA 1/2 Other  pt states she is BRCA 2 positive    Social History Social History   Tobacco Use   Smoking status: Never   Smokeless tobacco: Never  Vaping Use   Vaping Use: Never used  Substance Use Topics   Alcohol use: No    Alcohol/week: 0.0 standard drinks   Drug use: No    Review of Systems  Review of Systems  Constitutional:  Positive for fatigue. Negative for fever.  HENT:  Negative for congestion and sore throat.   Eyes:  Negative for visual disturbance.  Respiratory:  Negative for cough and shortness of breath.   Cardiovascular:  Negative for chest pain.  Gastrointestinal:  Negative for abdominal pain, diarrhea, nausea and vomiting.  Genitourinary:  Negative for flank pain.  Musculoskeletal:  Negative for back pain  and neck pain.  Skin:  Positive for wound. Negative for rash.  Neurological:  Positive for syncope and weakness.  All other systems reviewed and are negative.   ____________________________________________  PHYSICAL EXAM:      VITAL SIGNS: ED Triage Vitals  Enc Vitals Group     BP 04/19/21 0215 (!) 87/64     Pulse Rate 04/19/21 0215 63     Resp 04/19/21 0215 18     Temp 04/19/21 0215 97.8 F (36.6 C)     Temp src --      SpO2 04/19/21 0215 97 %     Weight 04/19/21 0230 140 lb (63.5 kg)     Height 04/19/21 0230 '5\' 5"'  (1.651 m)     Head Circumference --      Peak Flow --      Pain Score 04/19/21 0230 8     Pain Loc --      Pain Edu? --      Excl. in [place]? --      Physical Exam Vitals and nursing note reviewed.  Constitutional:      General: She is not in acute distress.    Appearance: She is well-developed.  HENT:     Head: Normocephalic and atraumatic.     Comments: Approximately 1.5 cm irregular laceration to the right forehead with exposed calvarium.  No visible foreign bodies.  No active bleeding. Eyes:     Conjunctiva/sclera: Conjunctivae normal.  Neck:     Comments: No midline or paraspinal tenderness. Cardiovascular:     Rate and Rhythm: Normal rate and regular rhythm.     Heart sounds: Normal heart sounds. No murmur heard.   No friction rub.  Pulmonary:     Effort: Pulmonary effort is normal. No respiratory distress.     Breath sounds: Normal breath sounds. No wheezing or rales.  Abdominal:     General: There is no distension.     Palpations: Abdomen is soft.     Tenderness: There is no abdominal tenderness.  Musculoskeletal:     Cervical back: Neck supple.  Skin:    General: Skin is warm.     Capillary Refill: Capillary refill takes less than 2 seconds.  Neurological:     Mental Status: She is alert and oriented to person, place, and time.     Motor: No abnormal muscle tone.      ____________________________________________   LABS (all labs  ordered are listed, but only abnormal results are displayed)  Labs Reviewed  BASIC METABOLIC PANEL - Abnormal; Notable for the following components:      Result Value   Sodium 132 (*)    Chloride 97 (*)  Glucose, Bld 145 (*)    Calcium 8.8 (*)    All other components within normal limits  CBC  URINALYSIS, COMPLETE (UACMP) WITH MICROSCOPIC  TROPONIN I (HIGH SENSITIVITY)  TROPONIN I (HIGH SENSITIVITY)    ____________________________________________  EKG: Normal sinus rhythm, ventricular rate 65.  PR 138, QRS 96, QTc 465.  No acute ST elevations or depressions.  EKG evidence of acute ischemic infarct. ________________________________________  RADIOLOGY All imaging, including plain films, CT scans, and ultrasounds, independently reviewed by me, and interpretations confirmed via formal radiology reads.  ED MD interpretation:   CT head: No acute intracranial abnormality CT C-spine: No acute fracture or listhesis   Official radiology report(s): CT Head Wo Contrast  Result Date: 04/19/2021 CLINICAL DATA:  Dizziness after exiting bed, fall with head strike on nightstand, uncertain loss of consciousness, headache and persistent dizziness, right forehead laceration with scant bleeding. No cervical tenderness. EXAM: CT HEAD WITHOUT CONTRAST CT CERVICAL SPINE WITHOUT CONTRAST TECHNIQUE: Multidetector CT imaging of the head and cervical spine was performed following the standard protocol without intravenous contrast. Multiplanar CT image reconstructions of the cervical spine were also generated. COMPARISON:  None. FINDINGS: CT HEAD FINDINGS Brain: No evidence of acute infarction, hemorrhage, hydrocephalus, extra-axial collection, visible mass lesion or mass effect. Symmetric prominence of the ventricles, cisterns and sulci compatible with parenchymal volume loss. Patchy areas of white matter hypoattenuation are most compatible with chronic microvascular angiopathy. Benign dural calcifications. No  suspicious 10 dural or falcine thickening. Partially empty sella, nonspecific in an elderly patient. Remaining midline structures are unremarkable. Vascular: No hyperdense vessel or unexpected calcification. Skull: Right frontal scalp laceration with mild swelling. No large hematoma. No subjacent calvarial fracture. No visible facial bone fracture within the included margins of imaging and limitations of this non dedicated exam. Chronic appearing nasal bone deformities. Sinuses/Orbits: Paranasal sinuses and mastoid air cells are predominantly clear. Included orbital structures are unremarkable. Other: None CT CERVICAL SPINE FINDINGS Alignment: Cervical stabilization collar is absent at the time of exam. Preservation of the normal cervical lordosis. No evidence of traumatic listhesis. No abnormally widened, perched or jumped facets. Normal alignment of the craniocervical and atlantoaxial articulations. Skull base and vertebrae: Normal bone mineralization. No acute skull base fracture. No vertebral body fracture or height loss. Bony fusion across the C2-C4 facets and partial fusion across the posterior vertebral bodies. No worrisome lytic or blastic lesions. Multilevel cervical spondylitic changes as detailed below. Additional arthrosis noted Landau dental and basion dens interval. Soft tissues and spinal canal: No pre or paravertebral fluid or swelling. No visible canal hematoma. Airways patent. Cervical carotid atherosclerosis. No acute traumatic findings of the included neck soft tissues. Disc levels: Multilevel intervertebral disc height loss with spondylitic endplate changes. Larger disc osteophyte complexes present C6-7 resulting in mild-to-moderate canal stenosis at this level. Mild narrowing at the C5-6 level as well. Additional smaller disc osteophyte complexes partially efface the ventral thecal sac without significant canal impingement. Multilevel uncinate spurring facet hypertrophic changes are present  throughout the cervical spine resulting in mild-to-moderate multilevel neural foraminal narrowing most pronounced C4-5 bilaterally and C5-6 on the left. Upper chest: Biapical pleuroparenchymal scarring. No acute abnormality in the upper chest or imaged lung apices. Other: Part calcified subcentimeter nodule in the left lobe thyroid gland, unlikely to be clinically significant; no follow-up imaging recommended (ref: J Am Coll Radiol. 2015 Feb;12(2): 143-50). IMPRESSION: Right frontal scalp swelling and laceration. No acute calvarial fracture. No acute intracranial abnormality. Background of parenchymal volume loss, microvascular angiopathy  and intracranial atherosclerosis. No acute cervical spine fracture or traumatic listhesis. Multilevel cervical spondylitic changes, as detailed above. Electronically Signed   By: Lovena Le M.D.   On: 04/19/2021 03:15   CT Cervical Spine Wo Contrast  Result Date: 04/19/2021 CLINICAL DATA:  Dizziness after exiting bed, fall with head strike on nightstand, uncertain loss of consciousness, headache and persistent dizziness, right forehead laceration with scant bleeding. No cervical tenderness. EXAM: CT HEAD WITHOUT CONTRAST CT CERVICAL SPINE WITHOUT CONTRAST TECHNIQUE: Multidetector CT imaging of the head and cervical spine was performed following the standard protocol without intravenous contrast. Multiplanar CT image reconstructions of the cervical spine were also generated. COMPARISON:  None. FINDINGS: CT HEAD FINDINGS Brain: No evidence of acute infarction, hemorrhage, hydrocephalus, extra-axial collection, visible mass lesion or mass effect. Symmetric prominence of the ventricles, cisterns and sulci compatible with parenchymal volume loss. Patchy areas of white matter hypoattenuation are most compatible with chronic microvascular angiopathy. Benign dural calcifications. No suspicious 10 dural or falcine thickening. Partially empty sella, nonspecific in an elderly patient.  Remaining midline structures are unremarkable. Vascular: No hyperdense vessel or unexpected calcification. Skull: Right frontal scalp laceration with mild swelling. No large hematoma. No subjacent calvarial fracture. No visible facial bone fracture within the included margins of imaging and limitations of this non dedicated exam. Chronic appearing nasal bone deformities. Sinuses/Orbits: Paranasal sinuses and mastoid air cells are predominantly clear. Included orbital structures are unremarkable. Other: None CT CERVICAL SPINE FINDINGS Alignment: Cervical stabilization collar is absent at the time of exam. Preservation of the normal cervical lordosis. No evidence of traumatic listhesis. No abnormally widened, perched or jumped facets. Normal alignment of the craniocervical and atlantoaxial articulations. Skull base and vertebrae: Normal bone mineralization. No acute skull base fracture. No vertebral body fracture or height loss. Bony fusion across the C2-C4 facets and partial fusion across the posterior vertebral bodies. No worrisome lytic or blastic lesions. Multilevel cervical spondylitic changes as detailed below. Additional arthrosis noted Landau dental and basion dens interval. Soft tissues and spinal canal: No pre or paravertebral fluid or swelling. No visible canal hematoma. Airways patent. Cervical carotid atherosclerosis. No acute traumatic findings of the included neck soft tissues. Disc levels: Multilevel intervertebral disc height loss with spondylitic endplate changes. Larger disc osteophyte complexes present C6-7 resulting in mild-to-moderate canal stenosis at this level. Mild narrowing at the C5-6 level as well. Additional smaller disc osteophyte complexes partially efface the ventral thecal sac without significant canal impingement. Multilevel uncinate spurring facet hypertrophic changes are present throughout the cervical spine resulting in mild-to-moderate multilevel neural foraminal narrowing most  pronounced C4-5 bilaterally and C5-6 on the left. Upper chest: Biapical pleuroparenchymal scarring. No acute abnormality in the upper chest or imaged lung apices. Other: Part calcified subcentimeter nodule in the left lobe thyroid gland, unlikely to be clinically significant; no follow-up imaging recommended (ref: J Am Coll Radiol. 2015 Feb;12(2): 143-50). IMPRESSION: Right frontal scalp swelling and laceration. No acute calvarial fracture. No acute intracranial abnormality. Background of parenchymal volume loss, microvascular angiopathy and intracranial atherosclerosis. No acute cervical spine fracture or traumatic listhesis. Multilevel cervical spondylitic changes, as detailed above. Electronically Signed   By: Lovena Le M.D.   On: 04/19/2021 03:15    ____________________________________________  PROCEDURES   Procedure(s) performed (including Critical Care):  Marland KitchenMarland KitchenLaceration Repair  Date/Time: 04/19/2021 9:33 AM Performed by: Duffy Bruce, MD Authorized by: Duffy Bruce, MD   Consent:    Consent obtained:  Verbal   Consent given by:  Patient  Risks discussed:  Infection, need for additional repair, pain, tendon damage, retained foreign body, vascular damage, poor cosmetic result, poor wound healing and nerve damage   Alternatives discussed:  Referral and delayed treatment Universal protocol:    Procedure explained and questions answered to patient or proxy's satisfaction: yes     Relevant documents present and verified: yes     Test results available: yes     Imaging studies available: yes     Site/side marked: yes     Immediately prior to procedure, a time out was called: yes     Patient identity confirmed:  Arm band Anesthesia:    Anesthesia method:  Local infiltration   Local anesthetic:  Lidocaine 1% WITH epi Laceration details:    Location:  Face   Face location:  Forehead   Length (cm):  2 Pre-procedure details:    Preparation:  Patient was prepped and draped in  usual sterile fashion and imaging obtained to evaluate for foreign bodies Exploration:    Hemostasis achieved with:  Direct pressure   Wound exploration: wound explored through full range of motion   Treatment:    Area cleansed with:  Betadine   Amount of cleaning:  Extensive   Irrigation solution:  Sterile water   Irrigation method:  Pressure wash   Visualized foreign bodies/material removed: no     Debridement:  None   Undermining:  Minimal   Layers/structures repaired:  Deep subcutaneous Deep subcutaneous:    Suture size:  5-0   Suture material:  Vicryl   Suture technique:  Simple interrupted   Number of sutures:  2 Skin repair:    Repair method:  Sutures   Suture size:  6-0   Suture material:  Prolene   Suture technique:  Simple interrupted   Number of sutures:  6 Approximation:    Approximation:  Close Repair type:    Repair type:  Simple Post-procedure details:    Dressing:  Antibiotic ointment, non-adherent dressing and sterile dressing   Procedure completion:  Tolerated well, no immediate complications  ____________________________________________  INITIAL IMPRESSION / MDM / ASSESSMENT AND PLAN / ED COURSE  As part of my medical decision making, I reviewed the following data within the [place] notes reviewed and incorporated, Old chart reviewed, Notes from prior ED visits, and [place] Controlled Substance Database       *Deanna Ross was evaluated in Emergency Department on 04/19/2021 for the symptoms described in the history of present illness. She was evaluated in the context of the global COVID-19 pandemic, which necessitated consideration that the patient might be at risk for infection with the SARS-CoV-2 virus that causes COVID-19. Institutional protocols and algorithms that pertain to the evaluation of patients at risk for COVID-19 are in a state of rapid change based on information released by regulatory bodies including the CDC and  federal and state organizations. These policies and algorithms were followed during the patient's care in the ED.  Some ED evaluations and interventions may be delayed as a result of limited staffing during the pandemic.*     Medical Decision Making: 70 year old female here with laceration to the forehead and fall.  Patient is afebrile, hemodynamically stable.  She remains asymptomatic after prolonged waiting time.  EKG is nonischemic.  Troponins negative x2.  CBC and BMP unremarkable with no leukocytosis or significant electrolyte abnormality.  Her laceration was closed as above.  Tolerated well.  Given that she had an open wound for moderately  decent amount of time, will place on empiric antibiotics to prevent infection.  Otherwise, will discharge with instructions to hydrate, as I suspect there is a component of dehydration as well as orthostasis in the setting of her blood pressure medications as well as recent COVID vaccination.  No signs of arrhythmia, stroke, or other emergent pathology.  ____________________________________________  FINAL CLINICAL IMPRESSION(S) / ED DIAGNOSES  Final diagnoses:  Laceration of forehead, initial encounter  Lightheaded  Fall, initial encounter     MEDICATIONS GIVEN DURING THIS VISIT:  Medications  lidocaine-EPINEPHrine (XYLOCAINE W/EPI) 2 %-1:100000 (with pres) injection 20 mL (20 mLs Intradermal Given by Other 04/19/21 1222)     ED Discharge Orders     None        Note:  This document was prepared using Dragon voice recognition software and may include unintentional dictation errors.   Duffy Bruce, MD 04/19/21 [phone]

## 2021-04-19 NOTE — ED Provider Notes (Signed)
Emergency Medicine Provider Triage Evaluation Note  Deanna Ross , a 70 y.o. female  was evaluated in triage.  Pt complains of falling at home.  She got up to go the bathroom during the night and then is not sure what happened but she got lightheaded and fell, striking her forehead on a bedside table.  She said that her forehead hurts but she has no pain in her neck.  She denies chest pain, shortness of breath, nausea, vomiting, and abdominal pain.  No extremity injuries.  She says she gets up and down a lot during the night and this is unusual but she has had several episodes of passing out in the past.   Review of Systems  Positive: Syncope or near syncope, minor head injury with laceration to forehead. Negative: Chest pain, shortness of breath, nausea, vomiting, abdominal pain, neck pain.  Physical Exam  BP (!) 100/49 (BP Location: Left Arm)   Pulse 69   Temp 97.8 F (36.6 C)   Resp 20   Ht 1.651 m ('5\' 5"'$ )   Wt 63.5 kg   SpO2 98%   BMI 23.30 kg/m  Gen:   Awake, no distress   Resp:  Normal effort  MSK:   For head injury with deep laceration (appears to be a missing piece of skin to the right middle of her forehead).  Bleeding well controlled.  Moves extremities without difficulty.  No obvious extremity injuries. Other:  Alert and oriented, no focal neurological deficits, no abdominal tenderness.  Medical Decision Making  Medically screening exam initiated at 6:20 AM.  Appropriate orders placed.  Allayah Egerer was informed that the remainder of the evaluation will be completed by another provider, this initial triage assessment does not replace that evaluation, and the importance of remaining in the ED until their evaluation is complete.  Initially the patient was made a level 2 because of her blood pressure but her blood pressure has normalized/stabilized and she is in no distress.  Her evaluation is generally reassuring but she will need some care to the laceration of her  forehead. Imaging complete without acute injury.  I am placing an order to decrease the triage acuity from 2 to 3 based on my assessment.   Hinda Kehr, MD 04/19/21 [phone]

## 2021-04-19 NOTE — ED Notes (Signed)
See triage note, pt reports getting dizzy this am and falling, hitting head on dresser. Laceration noted to top of right forehead, no bleeding at this time.  States she has been feeling unwell since getting 4th covid booster and unsure if that's what made her dizzy Pt in NAD

## 2021-04-19 NOTE — ED Triage Notes (Addendum)
Pt to triage via w/c with no distress noted; pt reports getting OOB to go to [place] and had onset dizziness, fell hitting head on nightstand; st unsure of LOC; c/o HA and persistent dizziness; no cervical tenderness with palpation; lac noted rt side forehead with scant bleeding; charge nurse notified of acuity

## 2021-04-19 NOTE — Discharge Instructions (Addendum)
No swimming until sutures are removed  It is ok to bathe/shower, blot the area dry  Cover with over-the-counter antibiotic ointment 2-3x daily

## 2021-04-25 DIAGNOSIS — Z4802 Encounter for removal of sutures: Secondary | ICD-10-CM | POA: Diagnosis not present

## 2021-05-24 ENCOUNTER — Other Ambulatory Visit: Payer: Self-pay | Admitting: Family Medicine

## 2021-05-24 DIAGNOSIS — K219 Gastro-esophageal reflux disease without esophagitis: Secondary | ICD-10-CM

## 2021-07-17 ENCOUNTER — Ambulatory Visit (INDEPENDENT_AMBULATORY_CARE_PROVIDER_SITE_OTHER): Payer: Medicare Other

## 2021-07-17 VITALS — BP 141/63 | Ht 65.0 in | Wt 140.0 lb

## 2021-07-17 DIAGNOSIS — Z0000 Encounter for general adult medical examination without abnormal findings: Secondary | ICD-10-CM | POA: Diagnosis not present

## 2021-07-17 NOTE — Progress Notes (Signed)
Subjective:   Deanna Ross is a 70 y.o. female who presents for Medicare Annual (Subsequent) preventive examination.  Review of Systems    No ROS.  Cardiac Risk Factors include: hypertension;advanced age (>61mn, >>27women)     Objective:    Today's Vitals   07/17/21 0949  BP: (!) 141/63  Weight: 140 lb (63.5 kg)  Height: '5\' 5"'  (1.651 m)   Body mass index is 23.3 kg/m.  Patient states she monitors Blood pressure daily. Reports BP is normally within limits Advanced Directives 07/17/2021 04/19/2021 07/14/2020 07/14/2019 07/10/2018 05/03/2018 07/09/2017  Does Patient Have a Medical Advance Directive? No No Yes Yes Yes No No  Type of Advance Directive - - Living will Living will;Out of facility DNR (pink MOST or yellow form) Living will - -  Does patient want to make changes to medical advance directive? - - No - Patient declined No - Patient declined No - Patient declined - -  Copy of [place] Chart? - - - - - - -  Would patient like information on creating a medical advance directive? No - Patient declined No - Patient declined - - - No - Patient declined No - Patient declined    Current Medications (verified) Outpatient Encounter Medications as of 07/17/2021  Medication Sig   atorvastatin (LIPITOR) 20 MG tablet TAKE 1 TABLET BY MOUTH  DAILY   esomeprazole (NEXIUM) 20 MG capsule TAKE 1 CAPSULE BY MOUTH 3  TIMES WEEKLY TAKE BEFORE  BREAKFAST   hydrochlorothiazide (HYDRODIURIL) 25 MG tablet TAKE 1 TABLET BY MOUTH  DAILY   lisinopril (ZESTRIL) 20 MG tablet TAKE 1 TABLET BY MOUTH  DAILY   Multiple Vitamins-Minerals (MULTIVITAMIN ADULT PO) Take by mouth.   No facility-administered encounter medications on file as of 07/17/2021.    Allergies (verified) Gadolinium derivatives, Iodine, and Tdap [tetanus-diphth-acell pertussis]   History: Past Medical History:  Diagnosis Date   [place]    Has BRCA 2 gene   Heart murmur    Skin cyst 03/28/2018    Past Surgical History:  Procedure Laterality Date   ABDOMINAL HYSTERECTOMY  2008   BREAST CYST ASPIRATION Left    negative   BREAST EXCISIONAL BIOPSY Left    negative x2 done at same time   TONSILLECTOMY AND ADENOIDECTOMY     TUBAL LIGATION     Family History  Problem Relation Age of Onset   Hyperlipidemia Mother    Hypertension Mother    Cancer Daughter        Breast   Breast cancer Daughter 363  Breast cancer Paternal Aunt 392  Breast cancer Maternal Grandmother 460  Breast cancer Sister 620  Cancer Paternal Uncle        Prostate   BRCA 1/2 Other        pt states she is BRCA 2 positive   Social History   Socioeconomic History   Marital status: Married    Spouse name: Not on file   Number of children: Not on file   Years of education: Not on file   Highest education level: Not on file  Occupational History   Not on file  Tobacco Use   Smoking status: Never   Smokeless tobacco: Never  Vaping Use   Vaping Use: Never used  Substance and Sexual Activity   Alcohol use: No    Alcohol/week: 0.0 standard drinks   Drug use: No   Sexual activity: Never  Partners: Male  Other Topics Concern   Not on file  Social History Narrative   Retired 4 years ago from [employer] with husband   3 daughters (27, 17, 76)    34 Grandchildren and 1 great grandchild    Enjoys swimming   2 cats and 1 dog- live inside the house    Social Determinants of Radio broadcast assistant Strain: Low Risk    Difficulty of Paying Living Expenses: Not hard at all  Food Insecurity: No Food Insecurity   Worried About Charity fundraiser in the Last Year: Never true   Arboriculturist in the Last Year: Never true  Transportation Needs: No Transportation Needs   Lack of Transportation (Medical): No   Lack of Transportation (Non-Medical): No  Physical Activity: Sufficiently Active   Days of Exercise per Week: 7 days   Minutes of Exercise per Session: 40 min  Stress: No Stress Concern  Present   Feeling of Stress : Not at all  Social Connections: Moderately Integrated   Frequency of Communication with Friends and Family: More than three times a week   Frequency of Social Gatherings with Friends and Family: More than three times a week   Attends Religious Services: More than 4 times per year   Active Member of [employer] or Organizations: No   Attends Music therapist: Never   Marital Status: Married   Clinical Intake:  Pre-visit preparation completed: Yes   Diabetes: No  How often do you need to have someone help you when you read instructions, pamphlets, or other written materials from your doctor or pharmacy?: 1 - Never   Interpreter Needed?: No   Activities of Daily Living In your present state of health, do you have any difficulty performing the following activities: 07/17/2021  Hearing? N  Vision? N  Comment Wears glasses  Difficulty concentrating or making decisions? N  Walking or climbing stairs? N  Dressing or bathing? N  Doing errands, shopping? N  Preparing Food and eating ? N  Using the Toilet? N  In the past six months, have you accidently leaked urine? N  Do you have problems with loss of bowel control? N  Managing your Medications? N  Managing your Finances? N  Comment Spouse assist  Housekeeping or managing your Housekeeping? N  Some recent data might be hidden    Patient Care Team: Leone Haven, MD as PCP - General (Family Medicine)  Indicate any recent Medical Services you may have received from other than [facility] providers in the past year (date may be approximate).     Assessment:   This is a routine wellness examination for [place].  Virtual Visit via Telephone Note  I connected with  Randa Spike on 07/17/21 at  9:45 AM EDT by telephone and verified that I am speaking with the correct person using two identifiers.  Location:  Patient /home Nurse office  I discussed the limitations, risks, security and  privacy concerns of performing an evaluation and management service by telephone and the availability of in person appointments. The patient expressed understanding and agreed to proceed.  Interactive audio and video telecommunications were attempted between this nurse and patient, however failed, due to patient having technical difficulties OR patient did not have access to video capability.  We continued and completed visit with audio only.  Some vital signs may be absent or patient reported.   Criselda Peaches, LPN  Hearing/Vision screen Hearing Screening - Comments:: No current  hearing difficulties. Vision Screening - Comments:: Wears glasses. Followed by Dr Ellin Mayhew, [facility]  Dietary issues and exercise activities discussed: Current Exercise Habits: Home exercise routine, Type of exercise: Other - see comments, Time (Minutes): 40, Frequency (Times/Week): 7, Weekly Exercise (Minutes/Week): 280, Intensity: Moderate, Exercise limited by: None identified  Regular Diet.   Goals Addressed             This Visit's Progress    Weight (lb) < 200 lb (90.7 kg)   140 lb (63.5 kg)    Patient states she would like to lose weight.       Depression Screen PHQ 2/9 Scores 07/17/2021 07/14/2020 01/14/2020 07/14/2019 08/24/2018 07/10/2018 07/09/2017  PHQ - 2 Score 0 0 0 0 0 0 0  PHQ- 9 Score - - - - - - 0    Fall Risk Fall Risk  07/17/2021 01/15/2021 07/14/2020 01/14/2020 07/14/2019  Falls in the past year? 1 0 0 0 0  Number falls in past yr: 0 0 0 0 -  Injury with Fall? 1 0 - - -  Comment Patient tripped and fell in 04/20/21.Marland Kitchen Sought medical care for head injuries. No current issues. - - - -  Follow up - Falls evaluation completed Falls evaluation completed Falls evaluation completed -    FALL RISK PREVENTION PERTAINING TO THE HOME:  Any stairs in or around the home? No  If so, are there any without handrails? No. Home free of loose throw rugs in walkways, pet beds, electrical  cords, etc? yes Adequate lighting in your home to reduce risk of falls? Yes   ASSISTIVE DEVICES UTILIZED TO PREVENT FALLS:  Life alert? No  Use of a cane, walker or w/c? No  Grab bars in the bathroom? No  Shower chair or bench in shower? No  Elevated toilet seat or a handicapped toilet? No   TIMED UP AND GO:  Was the test performed? No  Audio visit.   Cognitive Function: MMSE - Mini Mental State Exam 07/10/2018 07/09/2017 07/09/2016  Orientation to time '5 5 5  ' Orientation to Place '5 5 5  ' Registration '3 3 3  ' Attention/ Calculation '5 5 5  ' Recall '3 3 3  ' Language- name 2 objects '2 2 2  ' Language- repeat '1 1 1  ' Language- follow 3 step command '3 3 3  ' Language- read & follow direction '1 1 1  ' Write a sentence '1 1 1  ' Copy design 1 0 1  Copy design-comments - difficulty copying the design -  Total score '30 29 30     ' 6CIT Screen 07/17/2021 07/14/2019  What Year? 0 points 0 points  What month? 0 points 0 points  What time? 0 points 0 points  Count back from 20 0 points 0 points  Months in reverse 0 points 0 points  Repeat phrase 0 points 0 points  Total Score 0 0    Immunizations Immunization History  Administered Date(s) Administered   Fluad Quad(high Dose 65+) 07/12/2019, 07/17/2020   Influenza Split 06/23/2014   Influenza, High Dose Seasonal PF 06/06/2016, 07/09/2017, 07/10/2018, 07/10/2021   Influenza-Unspecified 07/06/2015   PFIZER(Purple Top)SARS-COV-2 Vaccination 11/18/2019, 12/09/2019, 07/17/2020, 04/13/2021   Pneumococcal Conjugate-13 11/30/2014   Pneumococcal Polysaccharide-23 07/09/2016   Covid-19 vaccine status: Completed vaccines.   Qualifies for Shingles Vaccine? Yes   Zostavax completed No   Shingrix Completed?: No.    Education has been provided regarding the importance of this vaccine. Patient has  been advised to call insurance company to determine out of pocket expense if they have not yet received this vaccine. Advised may also receive vaccine at local  pharmacy or Health Dept. Verbalized acceptance and understanding.  Screening Tests Health Maintenance  Topic Date Due   COVID-19 Vaccine (5 - Booster for Pfizer series) 08/02/2021 (Originally 06/08/2021)   Zoster Vaccines- Shingrix (1 of 2) 10/17/2021 (Originally 11/21/1969)   MAMMOGRAM  01/24/2022   Fecal DNA (Cologuard)  10/24/2022   TETANUS/TDAP  10/24/2024   Pneumonia Vaccine 90+ Years old  Completed   INFLUENZA VACCINE  Completed   DEXA SCAN  Completed   Hepatitis C Screening  Completed   HPV VACCINES  Aged Out    Health Maintenance  There are no preventive care reminders to display for this patient.   Vision Screening: Recommended annual ophthalmology exams for early detection of glaucoma and other disorders of the eye. Is the patient up to date with their annual eye exam?  Yes  Who is the provider or what is the name of the office in which the patient attends annual eye exams? Dr. Ellin Mayhew at the [place] office.    Plan:     I have personally reviewed and noted the following in the patient's chart:   Medical and social history Use of alcohol, tobacco or illicit drugs  Current medications and supplements including opioid prescriptions. Not currently taking opioids. Functional ability and status Nutritional status Physical activity Advanced directives List of other physicians Hospitalizations, surgeries, and ER visits in previous 12 months Vitals Screenings to include cognitive, depression, and falls Referrals and appointments  In addition, I have reviewed and discussed with patient certain preventive protocols, quality metrics, and best practice recommendations. A written personalized care plan for preventive services as well as general preventive health recommendations were provided to patient.     Criselda Peaches, LPN   [id number]

## 2021-07-17 NOTE — Patient Instructions (Addendum)
[name] , Thank you for taking time to come for your Medicare Wellness Visit. I appreciate your ongoing commitment to your health goals. Please review the following plan we discussed and let me know if I can assist you in the future.   These are the goals we discussed:  Goals       Follow up with Primary Care Provider (pt-stated)      As needed      Weight (lb) < 200 lb (90.7 kg)      Patient states she would like to lose weight.        This is a list of the screening recommended for you and due dates:  Health Maintenance  Topic Date Due   COVID-19 Vaccine (5 - Booster for Pfizer series) 08/02/2021*   Zoster (Shingles) Vaccine (1 of 2) 10/17/2021*   Mammogram  01/24/2022   Cologuard (Stool DNA test)  10/24/2022   Tetanus Vaccine  10/24/2024   Pneumonia Vaccine  Completed   Flu Shot  Completed   DEXA scan (bone density measurement)  Completed   Hepatitis C Screening: USPSTF Recommendation to screen - Ages 29-79 yo.  Completed   HPV Vaccine  Aged Out  *Topic was postponed. The date shown is not the original due date.     Advanced directives: No Plans to to receive in future  Conditions/risks identified: none  Next appointment: Follow up in one year for your annual wellness visit   Preventive Care 65 Years and Older, Female Preventive care refers to lifestyle choices and visits with your health care provider that can promote health and wellness. What does preventive care include? A yearly physical exam. This is also called an annual well check. Dental exams once or twice a year. Routine eye exams. Ask your health care provider how often you should have your eyes checked. Personal lifestyle choices, including: Daily care of your teeth and gums. Regular physical activity. Eating a healthy diet. Avoiding tobacco and drug use. Limiting alcohol use. Practicing safe sex. Taking low-dose aspirin every day. Taking vitamin and mineral supplements as recommended by your  health care provider. What happens during an annual well check? The services and screenings done by your health care provider during your annual well check will depend on your age, overall health, lifestyle risk factors, and family history of disease. Counseling  Your health care provider may ask you questions about your: Alcohol use. Tobacco use. Drug use. Emotional well-being. Home and relationship well-being. Sexual activity. Eating habits. History of falls. Memory and ability to understand (cognition). Work and work Statistician. Reproductive health. Screening  You may have the following tests or measurements: Height, weight, and BMI. Blood pressure. Lipid and cholesterol levels. These may be checked every 5 years, or more frequently if you are over 85 years old. Skin check. Lung cancer screening. You may have this screening every year starting at age 26 if you have a 30-pack-year history of smoking and currently smoke or have quit within the past 15 years. Fecal occult blood test (FOBT) of the stool. You may have this test every year starting at age 41. Flexible sigmoidoscopy or colonoscopy. You may have a sigmoidoscopy every 5 years or a colonoscopy every 10 years starting at age 38. Hepatitis C blood test. Hepatitis B blood test. Sexually transmitted disease (STD) testing. Diabetes screening. This is done by checking your blood sugar (glucose) after you have not eaten for a while (fasting). You may have this done every 1-3 years. Bone  density scan. This is done to screen for osteoporosis. You may have this done starting at age 27. Mammogram. This may be done every 1-2 years. Talk to your health care provider about how often you should have regular mammograms. Talk with your health care provider about your test results, treatment options, and if necessary, the need for more tests. Vaccines  Your health care provider may recommend certain vaccines, such as: Influenza vaccine.  This is recommended every year. Tetanus, diphtheria, and acellular pertussis (Tdap, Td) vaccine. You may need a Td booster every 10 years. Zoster vaccine. You may need this after age 35. Pneumococcal 13-valent conjugate (PCV13) vaccine. One dose is recommended after age 58. Pneumococcal polysaccharide (PPSV23) vaccine. One dose is recommended after age 22. Talk to your health care provider about which screenings and vaccines you need and how often you need them. This information is not intended to replace advice given to you by your health care provider. Make sure you discuss any questions you have with your health care provider. Document Released: 10/06/2015 Document Revised: 05/29/2016 Document Reviewed: 07/11/2015 Elsevier Interactive Patient Education  2017 [place] Prevention in the Home Falls can cause injuries. They can happen to people of all ages. There are many things you can do to make your home safe and to help prevent falls. What can I do on the outside of my home? Regularly fix the edges of walkways and driveways and fix any cracks. Remove anything that might make you trip as you walk through a door, such as a raised step or threshold. Trim any bushes or trees on the path to your home. Use bright outdoor lighting. Clear any walking paths of anything that might make someone trip, such as rocks or tools. Regularly check to see if handrails are loose or broken. Make sure that both sides of any steps have handrails. Any raised decks and porches should have guardrails on the edges. Have any leaves, snow, or ice cleared regularly. Use sand or salt on walking paths during winter. Clean up any spills in your garage right away. This includes oil or grease spills. What can I do in the bathroom? Use night lights. Install grab bars by the toilet and in the tub and shower. Do not use towel bars as grab bars. Use non-skid mats or decals in the tub or shower. If you need to sit  down in the shower, use a plastic, non-slip stool. Keep the floor dry. Clean up any water that spills on the floor as soon as it happens. Remove soap buildup in the tub or shower regularly. Attach bath mats securely with double-sided non-slip rug tape. Do not have throw rugs and other things on the floor that can make you trip. What can I do in the bedroom? Use night lights. Make sure that you have a light by your bed that is easy to reach. Do not use any sheets or blankets that are too big for your bed. They should not hang down onto the floor. Have a firm chair that has side arms. You can use this for support while you get dressed. Do not have throw rugs and other things on the floor that can make you trip. What can I do in the kitchen? Clean up any spills right away. Avoid walking on wet floors. Keep items that you use a lot in easy-to-reach places. If you need to reach something above you, use a strong step stool that has a grab bar. Keep  electrical cords out of the way. Do not use floor polish or wax that makes floors slippery. If you must use wax, use non-skid floor wax. Do not have throw rugs and other things on the floor that can make you trip. What can I do with my stairs? Do not leave any items on the stairs. Make sure that there are handrails on both sides of the stairs and use them. Fix handrails that are broken or loose. Make sure that handrails are as long as the stairways. Check any carpeting to make sure that it is firmly attached to the stairs. Fix any carpet that is loose or worn. Avoid having throw rugs at the top or bottom of the stairs. If you do have throw rugs, attach them to the floor with carpet tape. Make sure that you have a light switch at the top of the stairs and the bottom of the stairs. If you do not have them, ask someone to add them for you. What else can I do to help prevent falls? Wear shoes that: Do not have high heels. Have rubber bottoms. Are  comfortable and fit you well. Are closed at the toe. Do not wear sandals. If you use a stepladder: Make sure that it is fully opened. Do not climb a closed stepladder. Make sure that both sides of the stepladder are locked into place. Ask someone to hold it for you, if possible. Clearly mark and make sure that you can see: Any grab bars or handrails. First and last steps. Where the edge of each step is. Use tools that help you move around (mobility aids) if they are needed. These include: Canes. Walkers. Scooters. Crutches. Turn on the lights when you go into a dark area. Replace any light bulbs as soon as they burn out. Set up your furniture so you have a clear path. Avoid moving your furniture around. If any of your floors are uneven, fix them. If there are any pets around you, be aware of where they are. Review your medicines with your doctor. Some medicines can make you feel dizzy. This can increase your chance of falling. Ask your doctor what other things that you can do to help prevent falls. This information is not intended to replace advice given to you by your health care provider. Make sure you discuss any questions you have with your health care provider. Document Released: 07/06/2009 Document Revised: 02/15/2016 Document Reviewed: 10/14/2014 Elsevier Interactive Patient Education  2017 [employer].

## 2021-09-07 DIAGNOSIS — K819 Cholecystitis, unspecified: Secondary | ICD-10-CM | POA: Diagnosis not present

## 2021-09-07 DIAGNOSIS — I10 Essential (primary) hypertension: Secondary | ICD-10-CM | POA: Diagnosis not present

## 2021-09-07 DIAGNOSIS — Z79899 Other long term (current) drug therapy: Secondary | ICD-10-CM | POA: Diagnosis not present

## 2021-09-07 DIAGNOSIS — D72829 Elevated white blood cell count, unspecified: Secondary | ICD-10-CM | POA: Diagnosis not present

## 2021-09-07 DIAGNOSIS — K810 Acute cholecystitis: Secondary | ICD-10-CM | POA: Diagnosis not present

## 2021-09-07 DIAGNOSIS — R112 Nausea with vomiting, unspecified: Secondary | ICD-10-CM | POA: Diagnosis not present

## 2021-09-07 DIAGNOSIS — Z91041 Radiographic dye allergy status: Secondary | ICD-10-CM | POA: Diagnosis not present

## 2021-09-07 DIAGNOSIS — Z20822 Contact with and (suspected) exposure to covid-19: Secondary | ICD-10-CM | POA: Diagnosis not present

## 2021-09-07 DIAGNOSIS — R1011 Right upper quadrant pain: Secondary | ICD-10-CM | POA: Diagnosis not present

## 2021-09-07 DIAGNOSIS — E785 Hyperlipidemia, unspecified: Secondary | ICD-10-CM | POA: Diagnosis not present

## 2021-09-07 DIAGNOSIS — K660 Peritoneal adhesions (postprocedural) (postinfection): Secondary | ICD-10-CM | POA: Diagnosis not present

## 2021-09-08 DIAGNOSIS — K819 Cholecystitis, unspecified: Secondary | ICD-10-CM | POA: Diagnosis not present

## 2021-09-08 DIAGNOSIS — K660 Peritoneal adhesions (postprocedural) (postinfection): Secondary | ICD-10-CM | POA: Diagnosis not present

## 2021-09-08 DIAGNOSIS — K810 Acute cholecystitis: Secondary | ICD-10-CM | POA: Diagnosis not present

## 2021-11-07 ENCOUNTER — Other Ambulatory Visit: Payer: Self-pay | Admitting: Family Medicine

## 2021-11-07 DIAGNOSIS — K219 Gastro-esophageal reflux disease without esophagitis: Secondary | ICD-10-CM

## 2021-12-19 ENCOUNTER — Telehealth: Payer: Self-pay

## 2022-02-15 ENCOUNTER — Other Ambulatory Visit: Payer: Self-pay | Admitting: Family Medicine

## 2022-02-19 NOTE — Telephone Encounter (Signed)
Tried to call patient line busy no answer needs appt with provider for refills.

## 2022-02-19 NOTE — Telephone Encounter (Signed)
I have patient scheduled to come in July 10Th, patient states she has enough medication until appointment okay to deny until that time.

## 2022-04-01 ENCOUNTER — Encounter: Payer: Self-pay | Admitting: Family Medicine

## 2022-04-01 ENCOUNTER — Ambulatory Visit (INDEPENDENT_AMBULATORY_CARE_PROVIDER_SITE_OTHER): Payer: Medicare Other | Admitting: Family Medicine

## 2022-04-01 VITALS — BP 118/70 | HR 55 | Temp 97.7°F | Ht 65.0 in | Wt 147.4 lb

## 2022-04-01 DIAGNOSIS — Z9049 Acquired absence of other specified parts of digestive tract: Secondary | ICD-10-CM | POA: Diagnosis not present

## 2022-04-01 DIAGNOSIS — I10 Essential (primary) hypertension: Secondary | ICD-10-CM

## 2022-04-01 DIAGNOSIS — E785 Hyperlipidemia, unspecified: Secondary | ICD-10-CM

## 2022-04-01 DIAGNOSIS — R7303 Prediabetes: Secondary | ICD-10-CM

## 2022-04-01 DIAGNOSIS — W19XXXA Unspecified fall, initial encounter: Secondary | ICD-10-CM | POA: Insufficient documentation

## 2022-04-01 DIAGNOSIS — Z1501 Genetic susceptibility to malignant neoplasm of breast: Secondary | ICD-10-CM | POA: Diagnosis not present

## 2022-04-01 DIAGNOSIS — Z1231 Encounter for screening mammogram for malignant neoplasm of breast: Secondary | ICD-10-CM | POA: Diagnosis not present

## 2022-04-01 DIAGNOSIS — Z1509 Genetic susceptibility to other malignant neoplasm: Secondary | ICD-10-CM

## 2022-04-01 LAB — COMPREHENSIVE METABOLIC PANEL WITH GFR
ALT: 12 U/L (ref 0–35)
AST: 11 U/L (ref 0–37)
Albumin: 4.1 g/dL (ref 3.5–5.2)
Alkaline Phosphatase: 50 U/L (ref 39–117)
BUN: 14 mg/dL (ref 6–23)
CO2: 29 meq/L (ref 19–32)
Calcium: 9.4 mg/dL (ref 8.4–10.5)
Chloride: 110 meq/L (ref 96–112)
Creatinine, Ser: 0.71 mg/dL (ref 0.40–1.20)
GFR: 84.98 mL/min
Glucose, Bld: 91 mg/dL (ref 70–99)
Potassium: 4.1 meq/L (ref 3.5–5.1)
Sodium: 144 meq/L (ref 135–145)
Total Bilirubin: 0.5 mg/dL (ref 0.2–1.2)
Total Protein: 6.2 g/dL (ref 6.0–8.3)

## 2022-04-01 LAB — LIPID PANEL
Cholesterol: 141 mg/dL (ref 0–200)
HDL: 57.3 mg/dL (ref >39.00)
LDL Cholesterol: 67 mg/dL (ref 0–99)
NonHDL: 83.45
Total CHOL/HDL Ratio: 2
Triglycerides: 82 mg/dL (ref 0.0–149.0)
VLDL: 16.4 mg/dL (ref 0.0–40.0)

## 2022-04-01 LAB — HEMOGLOBIN A1C: Hgb A1c MFr Bld: 5.8 % (ref 4.6–6.5)

## 2022-04-01 NOTE — Assessment & Plan Note (Signed)
Check lipid panel.  Continue Lipitor 20 mg daily. 

## 2022-04-01 NOTE — Assessment & Plan Note (Signed)
Breast exam completed today.  Mammogram ordered.  Patient will call to get this scheduled.  Genetic counselor previously felt as though breast MRIs would not provide any additional benefit.

## 2022-04-01 NOTE — Assessment & Plan Note (Signed)
Adequately controlled.  She will continue HCTZ 25 mg daily and lisinopril 20 mg daily.  Lab work today.  Follow-up in 6 months for her blood pressure.

## 2022-04-01 NOTE — Progress Notes (Signed)
[name], MD Phone: [phone]  Deanna Ross is a 71 y.o. female who presents today for f/u.  HYPERTENSION Disease Monitoring Home BP Monitoring 130/72 Chest pain- no    Dyspnea- no Medications Compliance-  taking HCTZ/lisinopril.   Edema- no BMET    Component Value Date/Time   NA 132 (L) 04/19/2021 0222   NA 141 12/14/2012 0000   K 3.7 04/19/2021 0222   CL 97 (L) 04/19/2021 0222   CO2 26 04/19/2021 0222   GLUCOSE 145 (H) 04/19/2021 0222   BUN 21 04/19/2021 0222   BUN 21 12/14/2012 0000   CREATININE 0.88 04/19/2021 0222   CALCIUM 8.8 (L) 04/19/2021 0222   GFRNONAA >60 04/19/2021 0222   HYPERLIPIDEMIA Symptoms Chest pain on exertion:  no   Medications: Compliance- taking lipitor Right upper quadrant pain- no  Muscle aches- no Lipid Panel     Component Value Date/Time   CHOL 133 01/15/2021 0831   TRIG 114.0 01/15/2021 0831   HDL 57.70 01/15/2021 0831   CHOLHDL 2 01/15/2021 0831   VLDL 22.8 01/15/2021 0831   LDLCALC 53 01/15/2021 0831   LDLDIRECT 75.0 08/24/2018 1020   Status post laparoscopic cholecystectomy: Patient notes she is had no issues since having her gallbladder removed.  She notes no diarrhea.  BRCA mutation: Patient is due for mammogram and breast exam.  She notes no breast changes.  Fall: Patient had a fall last July.  She had 12 stitches on her forehead.  She notes she got up to go to the bathroom in the middle the night and it was dark.  She got lightheaded and ended up in a different part of her room than she thought and ended up falling and hitting her head.  She had no headaches since then.  She was evaluated in the emergency department.  She had imaging of her head and neck that were unremarkable for acute changes.  She has not had any falls since then.  She is now using a nightlight.  Social History   Tobacco Use  Smoking Status Never  Smokeless Tobacco Never    Current Outpatient Medications on File Prior to Visit  Medication  Sig Dispense Refill   atorvastatin (LIPITOR) 20 MG tablet TAKE 1 TABLET BY MOUTH  DAILY 90 tablet 3   esomeprazole (NEXIUM) 20 MG capsule TAKE 1 CAPSULE BY MOUTH 3  TIMES WEEKLY . TAKE BEFORE  BREAKFAST . 39 capsule 3   hydrochlorothiazide (HYDRODIURIL) 25 MG tablet TAKE 1 TABLET BY MOUTH  DAILY 90 tablet 3   lisinopril (ZESTRIL) 20 MG tablet TAKE 1 TABLET BY MOUTH  DAILY 90 tablet 3   Multiple Vitamins-Minerals (MULTIVITAMIN ADULT PO) Take by mouth.     No current facility-administered medications on file prior to visit.     ROS see history of present illness  Objective  Physical Exam Vitals:   04/01/22 0908  BP: 118/70  Pulse: (!) 55  Temp: 97.7 F (36.5 C)  SpO2: 98%    BP Readings from Last 3 Encounters:  04/01/22 118/70  07/17/21 (!) 141/63  04/19/21 (!) 120/59   Wt Readings from Last 3 Encounters:  04/01/22 147 lb 6.4 oz (66.9 kg)  07/17/21 140 lb (63.5 kg)  04/19/21 140 lb (63.5 kg)    Physical Exam Constitutional:      General: She is not in acute distress.    Appearance: She is not diaphoretic.  Cardiovascular:     Rate and Rhythm: Normal rate and regular rhythm.  Heart sounds: Normal heart sounds.  Pulmonary:     Effort: Pulmonary effort is normal.     Breath sounds: Normal breath sounds.  Chest:       Comments: [name] CMA served as chaperone, bilateral breast with skin changes noted in the picture though otherwise no skin changes, there were no masses, nipple inversion, or tenderness, no axillary masses Skin:    General: Skin is warm and dry.  Neurological:     Mental Status: She is alert.      Assessment/Plan: Please see individual problem list.  Problem List Items Addressed This Visit     BRCA2 positive (Chronic)    Breast exam completed today.  Mammogram ordered.  Patient will call to get this scheduled.  Genetic counselor previously felt as though breast MRIs would not provide any additional benefit.      Relevant Orders   MM  3D SCREEN BREAST BILATERAL   HTN (hypertension) - Primary (Chronic)    Adequately controlled.  She will continue HCTZ 25 mg daily and lisinopril 20 mg daily.  Lab work today.  Follow-up in 6 months for her blood pressure.      Relevant Orders   Comp Met (CMET)   Lipid panel   Hyperlipidemia (Chronic)    Check lipid panel.  Continue Lipitor 20 mg daily.      Relevant Orders   Comp Met (CMET)   Lipid panel   Prediabetes (Chronic)    Check A1c.      Relevant Orders   HgB A1c   Fall    Single fall about a year ago.  She will continue to use the night light.  She will monitor for any recurrent lightheadedness.      Status post cholecystectomy    Patient is doing well.  She has had no sequela of her cholecystectomy.  Check liver enzymes.      Other Visit Diagnoses     Encounter for screening mammogram for malignant neoplasm of breast       Relevant Orders   MM 3D SCREEN BREAST BILATERAL        Return in about 6 months (around 10/02/2022) for Hypertension.   [name], MD [facility]

## 2022-04-01 NOTE — Assessment & Plan Note (Signed)
Single fall about a year ago.  She will continue to use the night light.  She will monitor for any recurrent lightheadedness.

## 2022-04-01 NOTE — Assessment & Plan Note (Signed)
Patient is doing well.  She has had no sequela of her cholecystectomy.  Check liver enzymes.

## 2022-04-01 NOTE — Assessment & Plan Note (Signed)
Check A1c. 

## 2022-04-01 NOTE — Patient Instructions (Signed)
Nice to see you. We will get lab work today. Please call [phone] to schedule your mammogram.

## 2022-04-02 ENCOUNTER — Other Ambulatory Visit: Payer: Self-pay | Admitting: Family Medicine

## 2022-04-22 ENCOUNTER — Other Ambulatory Visit: Payer: Self-pay

## 2022-04-22 DIAGNOSIS — K219 Gastro-esophageal reflux disease without esophagitis: Secondary | ICD-10-CM

## 2022-04-22 MED ORDER — ESOMEPRAZOLE MAGNESIUM 20 MG PO CPDR: DELAYED_RELEASE_CAPSULE | ORAL | 3 refills | Status: DC

## 2022-04-23 ENCOUNTER — Ambulatory Visit
Admission: RE | Admit: 2022-04-23 | Discharge: 2022-04-23 | Disposition: A | Discharge status: Home / Self Care | Payer: Medicare Other | Source: Ambulatory Visit | Attending: Family Medicine | Admitting: Family Medicine

## 2022-04-23 DIAGNOSIS — Z1231 Encounter for screening mammogram for malignant neoplasm of breast: Secondary | ICD-10-CM | POA: Diagnosis not present

## 2022-04-23 DIAGNOSIS — Z1501 Genetic susceptibility to malignant neoplasm of breast: Secondary | ICD-10-CM | POA: Insufficient documentation

## 2022-04-23 DIAGNOSIS — Z1509 Genetic susceptibility to other malignant neoplasm: Secondary | ICD-10-CM | POA: Diagnosis not present

## 2022-05-12 ENCOUNTER — Emergency Department
Admission: EM | Admit: 2022-05-12 | Discharge: 2022-05-12 | Disposition: A | Discharge status: Home / Self Care | Payer: Medicare Other | Attending: Emergency Medicine | Admitting: Emergency Medicine

## 2022-05-12 ENCOUNTER — Other Ambulatory Visit: Payer: Self-pay

## 2022-05-12 ENCOUNTER — Encounter: Payer: Self-pay | Admitting: Intensive Care

## 2022-05-12 DIAGNOSIS — R42 Dizziness and giddiness: Secondary | ICD-10-CM | POA: Insufficient documentation

## 2022-05-12 DIAGNOSIS — R6889 Other general symptoms and signs: Secondary | ICD-10-CM | POA: Diagnosis not present

## 2022-05-12 DIAGNOSIS — R55 Syncope and collapse: Secondary | ICD-10-CM

## 2022-05-12 DIAGNOSIS — Z743 Need for continuous supervision: Secondary | ICD-10-CM | POA: Diagnosis not present

## 2022-05-12 DIAGNOSIS — R404 Transient alteration of awareness: Secondary | ICD-10-CM | POA: Diagnosis not present

## 2022-05-12 DIAGNOSIS — I10 Essential (primary) hypertension: Secondary | ICD-10-CM | POA: Diagnosis not present

## 2022-05-12 HISTORY — DX: Essential (primary) hypertension: I10

## 2022-05-12 HISTORY — DX: Pure hypercholesterolemia, unspecified: E78.00

## 2022-05-12 LAB — URINALYSIS, ROUTINE W REFLEX MICROSCOPIC
Bilirubin Urine: NEGATIVE
Glucose, UA: NEGATIVE mg/dL
Hgb urine dipstick: NEGATIVE
Ketones, ur: NEGATIVE mg/dL
Nitrite: NEGATIVE
Protein, ur: NEGATIVE mg/dL
Specific Gravity, Urine: 1.01 (ref 1.005–1.030)
pH: 7 (ref 5.0–8.0)

## 2022-05-12 LAB — BASIC METABOLIC PANEL
Anion gap: 9 (ref 5–15)
BUN: 15 mg/dL (ref 8–23)
CO2: 25 mmol/L (ref 22–32)
Calcium: 9.2 mg/dL (ref 8.9–10.3)
Chloride: 104 mmol/L (ref 98–111)
Creatinine, Ser: 0.63 mg/dL (ref 0.44–1.00)
GFR, Estimated: >60 mL/min (ref >60)
Glucose, Bld: 114 mg/dL — ABNORMAL HIGH (ref 70–99)
Potassium: 3.5 mmol/L (ref 3.5–5.1)
Sodium: 138 mmol/L (ref 135–145)

## 2022-05-12 LAB — CBC
HCT: 41.3 % (ref 36.0–46.0)
Hemoglobin: 13.5 g/dL (ref 12.0–15.0)
MCH: 29.9 pg (ref 26.0–34.0)
MCHC: 32.7 g/dL (ref 30.0–36.0)
MCV: 91.4 fL (ref 80.0–100.0)
Platelets: 264 10*3/uL (ref 150–400)
RBC: 4.52 MIL/uL (ref 3.87–5.11)
RDW: 12.4 % (ref 11.5–15.5)
WBC: 9.2 10*3/uL (ref 4.0–10.5)
nRBC: 0 % (ref 0.0–0.2)

## 2022-05-12 MED ORDER — LACTATED RINGERS IV BOLUS
1000.0000 mL | Freq: Once | INTRAVENOUS | Status: AC
  Administered 2022-05-12: 1000 mL via INTRAVENOUS

## 2022-05-12 NOTE — ED Triage Notes (Signed)
Pt called from [facility] to treatment room, no response 

## 2022-05-12 NOTE — ED Provider Notes (Signed)
[facility] Provider Note    Event Date/Time   First MD Initiated Contact with Patient 05/12/22 1457     (approximate)   History   Near Syncope   HPI  Deanna Ross is a 71 y.o. female who presents to the ED for evaluation of Near Syncope   I reviewed PCP visit from 7/10.  History of HTN, HLD, GERD. S/p cholecystectomy.   Patient presents to the ED with her husband and grandson for evaluation of 2 episodes of near syncope this morning while at church.  Patient reports 2 days of watery diarrhea last week, Monday and Tuesday, which is about 5 and 6 days ago.  She reports this normalized and her stools returned to normal.  Never had abdominal pain.  Husband reports that she has been weak and not quite herself over this past 1 week.  Patient reports that her daughter fell recently and patient was helping care for her.  She reports that she did not sleep at all last night, but she did have her typical breakfast this morning.  Patient reports standing in the choir when she began feeling lightheaded and presyncopal.  Reports that she felt flushed all over without chest pain, headache, falls or syncope.  One of her choir mates is a Marine scientist and had her lay down and she reports feeling better, she then stood to get on the EMS stretcher and reports feeling "terrible."   No recurrence of symptoms and reports feeling better now.  Asking if she can go home.  Physical Exam   Triage Vital Signs: ED Triage Vitals  Enc Vitals Group     BP 05/12/22 1215 (!) 124/47     Pulse Rate 05/12/22 1215 75     Resp 05/12/22 1215 16     Temp 05/12/22 1215 97.7 F (36.5 C)     Temp Source 05/12/22 1215 Oral     SpO2 05/12/22 1215 96 %     Weight 05/12/22 1213 138 lb (62.6 kg)     Height 05/12/22 1213 5' 4.75" (1.645 m)     Head Circumference --      Peak Flow --      Pain Score 05/12/22 1212 0     Pain Loc --      Pain Edu? --      Excl. in [place]? --     Most recent vital  signs: Vitals:   05/12/22 1730 05/12/22 1830  BP: (!) 116/58 121/63  Pulse: 63 63  Resp: 17 16  Temp:    SpO2: 100% 99%    General: Awake, no distress.  Stands without difficulty or assistance and reports feeling fine CV:  Good peripheral perfusion. RRR Resp:  Normal effort. CTAB Abd:  No distention.  Soft and benign throughout MSK:  No deformity noted.   Neuro:  No focal deficits appreciated. Cranial nerves II through XII intact 5/5 strength and sensation in all 4 extremities Other:     ED Results / Procedures / Treatments   Labs (all labs ordered are listed, but only abnormal results are displayed) Labs Reviewed  BASIC METABOLIC PANEL - Abnormal; Notable for the following components:      Result Value   Glucose, Bld 114 (*)    All other components within normal limits  URINALYSIS, ROUTINE W REFLEX MICROSCOPIC - Abnormal; Notable for the following components:   APPearance CLEAR (*)    Leukocytes,Ua SMALL (*)    All other components within normal  limits  CBC    EKG Sinus rhythm with a rate of 71 bpm.  Normal axis and intervals.  No clear signs of acute ischemia.  Nonspecific ST changes/biphasic T waves to lead III seen chronic when compared to EKG from 1 year ago.  RADIOLOGY   Official radiology report(s): No results found.  PROCEDURES and INTERVENTIONS:  .1-3 Lead EKG Interpretation  Performed by: Vladimir Crofts, MD Authorized by: Vladimir Crofts, MD     Interpretation: normal     ECG rate:  64   ECG rate assessment: normal     Rhythm: sinus rhythm     Ectopy: none     Conduction: normal     Medications  lactated ringers bolus 1,000 mL (1,000 mLs Intravenous New Bag/Given 05/12/22 1557)     IMPRESSION / MDM / ASSESSMENT AND PLAN / ED COURSE  I reviewed the triage vital signs and the nursing notes.  Differential diagnosis includes, but is not limited to, cardiac dysrhythmia, vasovagal episode, AKI, UTI, dehydration, TIA  {Patient presents with symptoms  of an acute illness or injury that is potentially life-threatening.  Pleasant and quite healthy 70 year old woman presents to the ED after 2 episodes of presyncope, possibly due to recent diarrheal illness and relative dehydration.  She has reassuring vital signs and looks systemically well.  No signs of neurologic or vascular deficits.  No signs of any trauma or falls.  Blood work is benign with normal CBC and metabolic panel.  EKG without ischemic features or signs of cardiac interval changes.  No dysrhythmias noted on the monitor.  We will provide IV fluids and check a urinalysis and reassess the patient with anticipation of outpatient management at her request.  Clinical Course as of 05/12/22 1919  Sun May 12, 2022  1918 Reassessed.  Patient reports she feels well.  We discussed reassuring work-up and possible etiologies of her symptoms.  She is requesting discharge to go home. [DS]    Clinical Course User Index [DS] Vladimir Crofts, MD     FINAL CLINICAL IMPRESSION(S) / ED DIAGNOSES   Final diagnoses:  Near syncope  Dizziness     Rx / DC Orders   ED Discharge Orders     None        Note:  This document was prepared using Dragon voice recognition software and may include unintentional dictation errors.   Vladimir Crofts, MD 05/12/22 [phone]

## 2022-05-12 NOTE — ED Triage Notes (Signed)
Pt in via EMS from church. Pt had 2 near syncopal episodes while singing in the choir. 124/70, HR 70, CBG 130

## 2022-05-12 NOTE — ED Notes (Signed)
Patient verbalized discharge understanding  

## 2022-05-14 ENCOUNTER — Ambulatory Visit (INDEPENDENT_AMBULATORY_CARE_PROVIDER_SITE_OTHER): Payer: Medicare Other | Admitting: Family

## 2022-05-14 ENCOUNTER — Encounter: Payer: Self-pay | Admitting: Family

## 2022-05-14 DIAGNOSIS — I10 Essential (primary) hypertension: Secondary | ICD-10-CM | POA: Diagnosis not present

## 2022-05-14 DIAGNOSIS — R197 Diarrhea, unspecified: Secondary | ICD-10-CM | POA: Diagnosis not present

## 2022-05-14 MED ORDER — HYDROCHLOROTHIAZIDE 25 MG PO TABS: 12.5000 mg | ORAL_TABLET | Freq: Every day | ORAL | 3 refills | Status: DC

## 2022-05-14 NOTE — Assessment & Plan Note (Signed)
Recent dizziness. Blood pressure on low end and concern exacerbated by diarrhea. Trial decrease hctz from '25mg'$  to 12.'5mg'$ . She will monitor blood pressure at home. Continue lisinopril '20mg'$ 

## 2022-05-14 NOTE — Progress Notes (Signed)
Subjective:    Patient ID: Deanna Ross, female    DOB: Apr 25, 1951, 71 y.o.   MRN: [id number]  CC: Deanna Ross is a 71 y.o. female who presents today for an acute visit.    HPI: Acute visit for chief complaint of watery brown diarrhea 8 days ago, waxed and waned. Occurs spontaneously and not after a meal.   She woke up in the middle of night and couldn't make it to the bathroom.  Resolved for 2 days and then recurred 3 days ago. She has gone to the bathroom 8 times today, yesterday had been better.   Drinks 2 cups coffee with splenda.  NO recent antibiotics.  No one sick at home. No travel, strange foods.   Drinking plenty of water. Eating some.    She is taking nexium 46m approx 3 days per week with control acid reflux. She is mostly diet controlled.  Dizziness has improved and only experiences  if she gets up too fast. She is compliant with hydrochlorothiazide 25 mg, lisinopril 20 mg .   seen in ED 05/12/22 for near syncopal episode. Labs unrevealing No cp, syncope, sob.  History of breast cancer H/o  abdominal hysterectomy Nonsmoker   HISTORY:  Past Medical History:  Diagnosis [place]   [place]    Has BRCA 2 gene   Heart murmur    High cholesterol    Hypertension    Skin cyst 03/28/2018   Past Surgical History:  Procedure Laterality Date   ABDOMINAL HYSTERECTOMY  2008   BREAST CYST ASPIRATION Left    negative   BREAST EXCISIONAL BIOPSY Left    negative x2 done at same time   TONSILLECTOMY AND ADENOIDECTOMY     TUBAL LIGATION     Family History  Problem Relation Age of Onset   Hyperlipidemia Mother    Hypertension Mother    Cancer Daughter        Breast   Breast cancer Daughter 34  Breast cancer Paternal Aunt 37  Breast cancer Maternal Grandmother 472  Breast cancer Sister [phone]  Cancer Paternal Uncle        Prostate   BRCA 1/2 Other        pt states she is BRCA 2 positive    Allergies: Shellfish-derived products, Gadolinium  derivatives, Iodine, and Tdap [tetanus-diphth-acell pertussis] Current Outpatient Medications on File Prior to Visit  Medication Sig Dispense Refill   atorvastatin (LIPITOR) 20 MG tablet TAKE 1 TABLET BY MOUTH  DAILY 90 tablet 3   esomeprazole (NEXIUM) 20 MG capsule TAKE 1 CAPSULE BY MOUTH 3  TIMES WEEKLY . TAKE BEFORE  BREAKFAST . 39 capsule 3   lisinopril (ZESTRIL) 20 MG tablet TAKE 1 TABLET BY MOUTH  DAILY 90 tablet 3   Multiple Vitamins-Minerals (MULTIVITAMIN ADULT PO) Take by mouth.     No current facility-administered medications on file prior to visit.    Social History   Tobacco Use   Smoking status: Never   Smokeless tobacco: Never  Vaping Use   Vaping Use: Never used  Substance Use Topics   Alcohol use: No    Alcohol/week: 0.0 standard drinks of alcohol   Drug use: No    Review of Systems  Constitutional:  Negative for chills and fever.  Respiratory:  Negative for cough.   Cardiovascular:  Negative for chest pain and palpitations.  Gastrointestinal:  Positive for diarrhea. Negative for abdominal pain, nausea and vomiting.  Genitourinary:  Negative for  dysuria.  Neurological:  Negative for dizziness (resolved) and syncope.      Objective:    BP 118/80 (BP Location: Left Arm, Patient Position: Sitting, Cuff Size: Normal)   Pulse 79   Temp (!) 97.4 F (36.3 C) (Oral)   Ht '5\' 4"'  (1.626 m)   Wt 138 lb 9.6 oz (62.9 kg)   SpO2 97%   BMI 23.79 kg/m   Wt Readings from Last 3 Encounters:  05/14/22 138 lb 9.6 oz (62.9 kg)  05/12/22 138 lb (62.6 kg)  04/01/22 147 lb 6.4 oz (66.9 kg)    Physical Exam Vitals reviewed.  Constitutional:      Appearance: Normal appearance. She is well-developed.  Eyes:     Conjunctiva/sclera: Conjunctivae normal.  Cardiovascular:     Rate and Rhythm: Normal rate and regular rhythm.     Pulses: Normal pulses.     Heart sounds: Normal heart sounds.  Pulmonary:     Effort: Pulmonary effort is normal.     Breath sounds: Normal  breath sounds. No wheezing, rhonchi or rales.  Abdominal:     General: Bowel sounds are normal. There is no distension.     Palpations: Abdomen is soft. Abdomen is not rigid. There is no fluid wave or mass.     Tenderness: There is no abdominal tenderness. There is no guarding or rebound.  Skin:    General: Skin is warm and dry.  Neurological:     Mental Status: She is alert.  Psychiatric:        Speech: Speech normal.        Behavior: Behavior normal.        Thought Content: Thought content normal.        Assessment & Plan:   Problem List Items Addressed This Visit       Cardiovascular and Mediastinum   HTN (hypertension) (Chronic)    Recent dizziness. Blood pressure on low end and concern exacerbated by diarrhea. Trial decrease hctz from 93m to 12.579m She will monitor blood pressure at home. Continue lisinopril 2036m    Relevant Medications   hydrochlorothiazide (HYDRODIURIL) 25 MG tablet     Other   Diarrhea    Afebrile, nontoxic in appearance.  Abdomen soft and nontender.  Patient is having 8 bowel movements a day,watery brown. Concern for infectious etiology.  Advised her for [facility] to hold any further doses of Imodium until we are sure etiology is not bacterial.  Strict return precautions provided in particular if she develops abdominal pain,fever or is unable to stay hydrated.       Relevant Orders   GI pathogen panel by PCR, stool      I have changed BonLarry Sierrasagoner's hydrochlorothiazide. I am also having her maintain her Multiple Vitamins-Minerals (MULTIVITAMIN ADULT PO), lisinopril, atorvastatin, and esomeprazole.   Meds ordered this encounter  Medications   hydrochlorothiazide (HYDRODIURIL) 25 MG tablet    Sig: Take 0.5 tablets (12.5 mg total) by mouth daily.    Dispense:  90 tablet    Refill:  3    Requesting 1 year supply    Order Specific Question:   Supervising Provider    Answer:   TULCrecencio [id number]]    Return precautions given.   Risks, benefits, and alternatives of the medications and treatment plan prescribed today were discussed, and patient expressed understanding.   Education regarding symptom management and diagnosis given to patient  on AVS.  Continue to follow with Leone Haven, MD for routine health maintenance.   Deanna Ross and I agreed with plan.   [name], FNP

## 2022-05-14 NOTE — Assessment & Plan Note (Addendum)
Afebrile, nontoxic in appearance.  Abdomen soft and nontender.  Patient is having 8 bowel movements a day,watery brown. Concern for infectious etiology.  Advised her for her to return stool study today to [place] and to hold any further doses of Imodium until we are sure etiology is not bacterial.  Strict return precautions provided in particular if she develops abdominal pain,fever or is unable to stay hydrated.

## 2022-05-14 NOTE — Patient Instructions (Addendum)
Id like you to TRIAL decrease hctz from '25mg'$  daily to 12.'5mg'$  daily. Monitor blood pressure.   For diarrhea, as discussed I ordered stool studies to investigate whether this is viral or bacterial.  As discussed, please return stool studies to [place] today or tomorrow.   I would ask you to stop Imodium until we have the results.  Please call and let me know right away if you develop abdominal pain or fever is that would warrant further evaluation, imaging.  Food Choices to Help Relieve Diarrhea, Adult Diarrhea can make you feel weak and cause you to become dehydrated. It is important to choose the right foods and drinks to: Relieve diarrhea. Replace lost fluids and nutrients. Prevent dehydration. What are tips for following this plan? Relieving diarrhea Avoid foods that make your diarrhea worse. These may include: Foods and beverages sweetened with high-fructose corn syrup, honey, or sweeteners such as xylitol, sorbitol, and mannitol. Fried, greasy, or spicy foods. Raw fruits and vegetables. Eat foods that are rich in probiotics. These include foods such as yogurt and fermented milk products. Probiotics can help increase healthy bacteria in your stomach and intestines (gastrointestinal tract or GI tract). This may help digestion and stop diarrhea. If you have lactose intolerance, avoid dairy products. These may make your diarrhea worse. Take medicine to help stop diarrhea only as told by your health care provider. Replacing nutrients  Eat bland, easy-to-digest foods in small amounts as you are able, until your diarrhea starts to get better. These foods include bananas, applesauce, rice, toast, and crackers. Gradually reintroduce nutrient-rich foods as tolerated or as told by your health care provider. This includes: Well-cooked protein foods, such as eggs, lean meats like fish or chicken without skin, and tofu. Peeled, seeded, and soft-cooked fruits and vegetables. Low-fat dairy  products. Whole grains. Take vitamin and mineral supplements as told by your health care provider. Preventing dehydration  Start by sipping water or a solution to prevent dehydration (oral rehydration solution, ORS). This is a drink that helps replace fluids and minerals your body has lost. You can buy an ORS at pharmacies and retail stores. Try to drink at least 8-10 cups (2,000-2,500 mL) of fluid each day to help replace lost fluids. If you have urine that is pale yellow, you are getting enough fluids. You may drink other liquids in addition to water, such as fruit juice that you have added water to (diluted fruit juice) or low-calorie sports drinks, as tolerated or as told by your health care provider. Avoid drinks with caffeine, such as coffee, tea, or soft drinks. Avoid alcohol. Summary When you have diarrhea, it is important to choose the right foods and drinks to relieve diarrhea, to replace lost fluids and nutrients, and to prevent dehydration. Make sure you drink enough fluid to keep your urine pale yellow. You may benefit from eating bland foods at first. Gradually reintroduce healthy, nutrient-rich foods as tolerated or as told by your health care provider. Avoid foods that make your diarrhea worse, such as fried, greasy, or spicy foods. This information is not intended to replace advice given to you by your health care provider. Make sure you discuss any questions you have with your health care provider. Document Revised: 11/30/2021 Document Reviewed: 10/26/2019 Elsevier Patient Education  [place].

## 2022-05-16 ENCOUNTER — Other Ambulatory Visit
Admission: RE | Admit: 2022-05-16 | Discharge: 2022-05-16 | Disposition: A | Discharge status: Home / Self Care | Payer: Medicare Other | Source: Ambulatory Visit | Attending: Family | Admitting: Family

## 2022-05-16 DIAGNOSIS — R197 Diarrhea, unspecified: Secondary | ICD-10-CM | POA: Insufficient documentation

## 2022-05-16 LAB — GASTROINTESTINAL PANEL BY PCR, STOOL (REPLACES STOOL CULTURE)

## 2022-06-13 ENCOUNTER — Encounter: Payer: Self-pay | Admitting: Internal Medicine

## 2022-06-13 ENCOUNTER — Ambulatory Visit (INDEPENDENT_AMBULATORY_CARE_PROVIDER_SITE_OTHER): Payer: Medicare Other | Admitting: Internal Medicine

## 2022-06-13 VITALS — BP 126/82 | HR 63 | Temp 97.8°F | Ht 64.0 in | Wt 142.4 lb

## 2022-06-13 DIAGNOSIS — I10 Essential (primary) hypertension: Secondary | ICD-10-CM

## 2022-06-13 DIAGNOSIS — R42 Dizziness and giddiness: Secondary | ICD-10-CM | POA: Diagnosis not present

## 2022-06-13 DIAGNOSIS — R55 Syncope and collapse: Secondary | ICD-10-CM | POA: Diagnosis not present

## 2022-06-13 DIAGNOSIS — R350 Frequency of micturition: Secondary | ICD-10-CM

## 2022-06-13 LAB — URINALYSIS, ROUTINE W REFLEX MICROSCOPIC
Bilirubin Urine: NEGATIVE
Ketones, ur: NEGATIVE
Leukocytes,Ua: NEGATIVE
Nitrite: NEGATIVE
RBC / HPF: NONE SEEN (ref 0–?)
Specific Gravity, Urine: <=1.005 — AB (ref 1.000–1.030)
Total Protein, Urine: NEGATIVE
Urine Glucose: NEGATIVE
Urobilinogen, UA: 0.2 (ref 0.0–1.0)
WBC, UA: NONE SEEN (ref 0–?)
pH: 6 (ref 5.0–8.0)

## 2022-06-13 NOTE — Patient Instructions (Signed)
I am ruling out a UTI as the cause of your symptoms  If there is no evidence of UTI, I will make a cardiology referral to rule out irregular heart rhythms

## 2022-06-13 NOTE — Progress Notes (Signed)
Subjective:  Patient ID: Deanna Ross, female    DOB: March 07, 1951  Age: 71 y.o. MRN: [id number]  CC: The primary encounter diagnosis was Frequent urination. Diagnoses of Primary hypertension and Postural dizziness with presyncope were also pertinent to this visit.   HPI Bhavana Kady presents for follow up on recent episode of near syncope on August Chief Complaint  Patient presents with   Follow-up    Follow up -Clammy   Prediabetes , hypertension  history .  She was evaluated in ED on August 30 after an event of  "wooziness and diaphoresis which occurred at church. Was too weak to walk.  Taken by EMS to ER.  Labs ,  EKGs,  done attributed to dehydration for recent 2 days history of diarrhea and lack of sleep the night before caring for her 52 yr old mother in law.   Has stopped working out due to caring for mother in law for the past 3 weeks.    Since then she has had follow up wit NP Arnette, and her dose of HCTZwas reduced to 12.5 mg. DAILY.  She reports  subsequent bps normal,  but continues to have episodes of "feeling  clammy " which occur randomly, and specifically not related to fasting or exertion.  She has prediabetes but does  not drink sugared beverages,  does not eat concentrated sweets    Outpatient Medications Prior to Visit  Medication Sig Dispense Refill   atorvastatin (LIPITOR) 20 MG tablet TAKE 1 TABLET BY MOUTH  DAILY 90 tablet 3   esomeprazole (NEXIUM) 20 MG capsule TAKE 1 CAPSULE BY MOUTH 3  TIMES WEEKLY . TAKE BEFORE  BREAKFAST . 39 capsule 3   hydrochlorothiazide (HYDRODIURIL) 25 MG tablet Take 0.5 tablets (12.5 mg total) by mouth daily. (Patient taking differently: Take 12.5 mg by mouth daily. Patient is taking 12.5 mg per [name]) 90 tablet 3   lisinopril (ZESTRIL) 20 MG tablet TAKE 1 TABLET BY MOUTH  DAILY 90 tablet 3   Multiple Vitamins-Minerals (MULTIVITAMIN ADULT PO) Take by mouth.     No facility-administered medications prior to visit.     Review of Systems;  Patient denies headache, fevers, malaise, unintentional weight loss, skin rash, eye pain, sinus congestion and sinus pain, sore throat, dysphagia,  hemoptysis , cough, dyspnea, wheezing, chest pain, palpitations, orthopnea, edema, abdominal pain, nausea, melena, diarrhea, constipation, flank pain, dysuria, hematuria, urinary  Frequency, nocturia, numbness, tingling, seizures,  Focal weakness, Loss of consciousness,  Tremor, insomnia, depression, anxiety, and suicidal ideation.      Objective:  BP 126/82 (BP Location: Left Arm, Patient Position: Sitting, Cuff Size: Normal)   Pulse 63   Temp 97.8 F (36.6 C) (Oral)   Ht '5\' 4"'$  (1.626 m)   Wt 142 lb 6.4 oz (64.6 kg)   SpO2 98%   BMI 24.44 kg/m   BP Readings from Last 3 Encounters:  06/13/22 126/82  05/14/22 118/80  05/12/22 (!) 111/53    Wt Readings from Last 3 Encounters:  06/13/22 142 lb 6.4 oz (64.6 kg)  05/14/22 138 lb 9.6 oz (62.9 kg)  05/12/22 138 lb (62.6 kg)    General appearance: alert, cooperative and appears stated age Ears: normal TM's and external ear canals both ears Throat: lips, mucosa, and tongue normal; teeth and gums normal Neck: no adenopathy, no carotid bruit, supple, symmetrical, trachea midline and thyroid not enlarged, symmetric, no tenderness/mass/nodules Back: symmetric, no curvature. ROM normal. No CVA tenderness. Lungs: clear to auscultation  bilaterally Heart: regular rate and rhythm, S1, S2 normal, no murmur, click, rub or gallop Abdomen: soft, non-tender; bowel sounds normal; no masses,  no organomegaly Pulses: 2+ and symmetric Skin: Skin color, texture, turgor normal. No rashes or lesions Lymph nodes: Cervical, supraclavicular, and axillary nodes normal. Neuro:  awake and interactive with normal mood and affect. Higher cortical functions are normal. Speech is clear without word-finding difficulty or dysarthria. Extraocular movements are intact. Visual fields of both eyes are  grossly intact. Sensation to light touch is grossly intact bilaterally of upper and lower extremities. Motor examination shows 4+/5 symmetric hand grip and upper extremity and 5/5 lower extremity strength. There is no pronation or drift. Gait is non-ataxic   Lab Results  Component Value Date   HGBA1C 5.8 04/01/2022   HGBA1C 5.6 01/15/2021   HGBA1C 5.6 01/14/2020    Lab Results  Component Value Date   CREATININE 0.63 05/12/2022   CREATININE 0.71 04/01/2022   CREATININE 0.88 04/19/2021    Lab Results  Component Value Date   WBC 9.2 05/12/2022   HGB 13.5 05/12/2022   HCT 41.3 05/12/2022   PLT 264 05/12/2022   GLUCOSE 114 (H) 05/12/2022   CHOL 141 04/01/2022   TRIG 82.0 04/01/2022   HDL 57.30 04/01/2022   LDLDIRECT 75.0 08/24/2018   LDLCALC 67 04/01/2022   ALT 12 04/01/2022   AST 11 04/01/2022   NA 138 05/12/2022   K 3.5 05/12/2022   CL 104 05/12/2022   CREATININE 0.63 05/12/2022   BUN 15 05/12/2022   CO2 25 05/12/2022   TSH 0.69 02/03/2017   HGBA1C 5.8 04/01/2022    No results found.  Assessment & Plan:   Problem List Items Addressed This Visit     HTN (hypertension) (Chronic)    Remains well controlled with reduction in dose of HCTA.  No changes today      Postural dizziness with presyncope    ER visit reviewed as well as follow up with MA.  She has bacteruria without pyuria on today's UA /culture.  Will treat, and if symptoms persist,  Will refer to cardiology for ruel out arrhythmia.       Other Visit Diagnoses     Frequent urination    -  Primary   Relevant Orders   Urinalysis, Routine w reflex microscopic (Completed)   Urine Culture (Completed)       I spent a total of  28 minutes with this patient in a face to face visit on the date of this encounter reviewing the last office visit with Kathrin Penner,  home blood pressure readings, recent ER visit including labs and imaging studies ,   and post visit ordering of testing and therapeutics.     Follow-up: No follow-ups on file.   Crecencio Mc, MD

## 2022-06-16 DIAGNOSIS — R42 Dizziness and giddiness: Secondary | ICD-10-CM | POA: Insufficient documentation

## 2022-06-16 LAB — URINE CULTURE
MICRO NUMBER:: 13950101
SPECIMEN QUALITY:: ADEQUATE

## 2022-06-16 MED ORDER — SULFAMETHOXAZOLE-TRIMETHOPRIM 800-160 MG PO TABS: 1.0000 | ORAL_TABLET | Freq: Two times a day (BID) | ORAL | 0 refills | Status: DC

## 2022-06-16 NOTE — Assessment & Plan Note (Signed)
Remains well controlled with reduction in dose of HCTA.  No changes today

## 2022-06-16 NOTE — Assessment & Plan Note (Addendum)
ER visit reviewed as well as follow up with MA.  She has bacteruria without pyuria on today's UA /culture.  Will treat, and if symptoms persist,  Will refer to cardiology for ruel out arrhythmia.

## 2022-06-17 ENCOUNTER — Telehealth: Payer: Self-pay

## 2022-06-17 NOTE — Telephone Encounter (Signed)
-----   Message from Deanna Mc, MD sent at 06/16/2022  3:47 PM EDT ----- Her urine culture came back positive for UTI.  Given her continued episodes of "clamminess,"  I recommend treating.  If the episodes do NOT resolve,  let me know and I will make the referral to cardiology to rule out an arrhythmia

## 2022-06-17 NOTE — Telephone Encounter (Signed)
Spoke with Patient and clarified. Patient voiced understanding and is agreeable.

## 2022-06-17 NOTE — Telephone Encounter (Signed)
LMTCB regarding Her urine culture came back positive for UTI. Dr. Derrel Nip called in Bactrim.  Given her continued episodes of "clamminess,"  I recommend treating.  If the episodes do NOT resolve,  let me know and I will make the referral to cardiology to rule out an arrhythmia

## 2022-06-17 NOTE — Telephone Encounter (Signed)
Pt returned call and I tried to read the message to her but clarity is needed for the pt

## 2022-06-18 ENCOUNTER — Telehealth: Payer: Self-pay | Admitting: Family Medicine

## 2022-06-18 NOTE — Telephone Encounter (Signed)
I called the patient and spoke with her husband and he stated they did received the medication the pharmacy had the wrong address and phone number for the patient but it is straightened out now.  Michella Detjen,cma

## 2022-06-18 NOTE — Telephone Encounter (Signed)
These were sent in by Dr Derrel Nip. Please call the pharmacy and see if they received the prescription. Thanks.

## 2022-06-18 NOTE — Telephone Encounter (Signed)
Patient called stating her UTI abx was not at [facility]. I can see where the medication was sent on 06/16/22.

## 2022-06-26 ENCOUNTER — Telehealth: Payer: Self-pay | Admitting: Family Medicine

## 2022-06-26 ENCOUNTER — Other Ambulatory Visit: Payer: Self-pay | Admitting: Internal Medicine

## 2022-06-26 DIAGNOSIS — R42 Dizziness and giddiness: Secondary | ICD-10-CM

## 2022-06-26 NOTE — Telephone Encounter (Signed)
Patient saw Dr Derrel Nip on 06/13/2022. She was given a antibiotic. Patient still is still feeling clammy and dizziness. Patient stated Dr Derrel Nip told her to call her if she was still having issues.

## 2022-07-09 ENCOUNTER — Ambulatory Visit (INDEPENDENT_AMBULATORY_CARE_PROVIDER_SITE_OTHER): Payer: Medicare Other | Admitting: Family Medicine

## 2022-07-09 ENCOUNTER — Encounter: Payer: Self-pay | Admitting: Family Medicine

## 2022-07-09 DIAGNOSIS — R55 Syncope and collapse: Secondary | ICD-10-CM | POA: Diagnosis not present

## 2022-07-09 DIAGNOSIS — I10 Essential (primary) hypertension: Secondary | ICD-10-CM | POA: Diagnosis not present

## 2022-07-09 DIAGNOSIS — R42 Dizziness and giddiness: Secondary | ICD-10-CM

## 2022-07-09 DIAGNOSIS — H539 Unspecified visual disturbance: Secondary | ICD-10-CM | POA: Diagnosis not present

## 2022-07-09 DIAGNOSIS — R197 Diarrhea, unspecified: Secondary | ICD-10-CM | POA: Diagnosis not present

## 2022-07-09 NOTE — Assessment & Plan Note (Signed)
Dizziness has improved though she remains intermittently clammy.  She will see cardiology as planned to see if they think there is an underlying cardiac cause contributing.

## 2022-07-09 NOTE — Progress Notes (Signed)
Deanna Rumps, MD Phone: [phone]  Deanna Ross is a 71 y.o. female who presents today for f/u.  HYPERTENSION Disease Monitoring Chest pain- no    Dyspnea- no Medications Compliance-  taking HCTZ, lisinopril. Lightheadedness-  resolved, no additional presyncopal episodes since reducing her HCTZ dose  Edema- no Continues to intermittently have clammy episodes. Has cardiology appointment scheduled.  BMET    Component Value Date/Time   NA 138 05/12/2022 1220   NA 141 12/14/2012 0000   K 3.5 05/12/2022 1220   CL 104 05/12/2022 1220   CO2 25 05/12/2022 1220   GLUCOSE 114 (H) 05/12/2022 1220   BUN 15 05/12/2022 1220   BUN 21 12/14/2012 0000   CREATININE 0.63 05/12/2022 1220   CALCIUM 9.2 05/12/2022 1220   GFRNONAA >60 05/12/2022 1220   Reports no UTI symptoms. She completed her course of bactrim.   Notes diarrhea resolved.   Does report some slight vision changes with blurrier vision since she had her presyncopal episode. Sees he eye doctor in a couple of weeks.   Social History   Tobacco Use  Smoking Status Never  Smokeless Tobacco Never    Current Outpatient Medications on File Prior to Visit  Medication Sig Dispense Refill   atorvastatin (LIPITOR) 20 MG tablet TAKE 1 TABLET BY MOUTH  DAILY 90 tablet 3   esomeprazole (NEXIUM) 20 MG capsule TAKE 1 CAPSULE BY MOUTH 3  TIMES WEEKLY . TAKE BEFORE  BREAKFAST . 39 capsule 3   hydrochlorothiazide (HYDRODIURIL) 25 MG tablet Take 0.5 tablets (12.5 mg total) by mouth daily. (Patient taking differently: Take 12.5 mg by mouth daily. Patient is taking 12.5 mg per Deanna Ross) 90 tablet 3   lisinopril (ZESTRIL) 20 MG tablet TAKE 1 TABLET BY MOUTH  DAILY 90 tablet 3   Multiple Vitamins-Minerals (MULTIVITAMIN ADULT PO) Take by mouth.     No current facility-administered medications on file prior to visit.     ROS see history of present illness  Objective  Physical Exam Vitals:   07/09/22 1159  BP: 130/80   Pulse: 73  Temp: 98.1 F (36.7 C)  SpO2: 99%    BP Readings from Last 3 Encounters:  07/09/22 130/80  06/13/22 126/82  05/14/22 118/80   Wt Readings from Last 3 Encounters:  07/09/22 141 lb 3.2 oz (64 kg)  06/13/22 142 lb 6.4 oz (64.6 kg)  05/14/22 138 lb 9.6 oz (62.9 kg)    Physical Exam Constitutional:      General: She is not in acute distress.    Appearance: She is not diaphoretic.  Eyes:     Conjunctiva/sclera: Conjunctivae normal.     Pupils: Pupils are equal, round, and reactive to light.     Comments: Fundus exam attempted though limited by pupillary constriction, no apparent cataracts   Cardiovascular:     Rate and Rhythm: Normal rate and regular rhythm.     Heart sounds: Normal heart sounds.  Pulmonary:     Effort: Pulmonary effort is normal.     Breath sounds: Normal breath sounds.  Skin:    General: Skin is warm and dry.  Neurological:     Mental Status: She is alert.      Assessment/Plan: Please see individual problem list.  Problem List Items Addressed This Visit     HTN (hypertension) (Chronic)    Well-controlled.  She will continue lisinopril 20 mg daily and HCTZ 12.5 mg daily.      Diarrhea    Resolved.  Postural dizziness with presyncope    Dizziness has improved though she remains intermittently clammy.  She will see cardiology as planned to see if they think there is an underlying cardiac cause contributing.      Vision changes    There is a slight difference between her eyes though it is not too big of a difference.  We will have her see her eye doctor as scheduled.        Return for as scheduled.   Deanna Rumps, MD [place]

## 2022-07-09 NOTE — Assessment & Plan Note (Signed)
There is a slight difference between her eyes though it is not too big of a difference.  We will have her see her eye doctor as scheduled.

## 2022-07-09 NOTE — Assessment & Plan Note (Signed)
Resolved

## 2022-07-09 NOTE — Assessment & Plan Note (Signed)
Well-controlled.  She will continue lisinopril 20 mg daily and HCTZ 12.5 mg daily.

## 2022-07-18 ENCOUNTER — Ambulatory Visit (INDEPENDENT_AMBULATORY_CARE_PROVIDER_SITE_OTHER): Payer: Medicare Other

## 2022-07-18 VITALS — Ht 64.0 in | Wt 141.0 lb

## 2022-07-18 DIAGNOSIS — Z0000 Encounter for general adult medical examination without abnormal findings: Secondary | ICD-10-CM

## 2022-07-18 NOTE — Progress Notes (Signed)
Subjective:   Deanna Ross is a 71 y.o. female who presents for Medicare Annual (Subsequent) preventive examination.  Review of Systems    No ROS.  Medicare Wellness Virtual Visit.  Visual/audio telehealth visit, UTA vital signs.   See social history for additional risk factors.   Cardiac Risk Factors include: advanced age (>80men, >8 women)     Objective:    Today's Vitals   07/18/22 1417  Weight: 141 lb (64 kg)  Height: 5\' 4"  (1.626 m)   Body mass index is 24.2 kg/m.     07/18/2022    2:12 PM 05/12/2022   12:14 PM 07/17/2021   12:29 PM 04/19/2021    2:34 AM 07/14/2020    9:38 AM 07/14/2019    9:47 AM 07/10/2018   10:11 AM  Advanced Directives  Does Patient Have a Medical Advance Directive? Yes No No No Yes Yes Yes  Type of 07/12/2018 of [place];Living will    Living will Living will;Out of facility DNR (pink MOST or yellow form) Living will  Does patient want to make changes to medical advance directive? No - Patient declined    No - Patient declined No - Patient declined No - Patient declined  Copy of Healthcare Power of Attorney in Chart? No - copy requested        Would patient like information on creating a medical advance directive?  No - Patient declined No - Patient declined No - Patient declined       Current Medications (verified) Outpatient Encounter Medications as of 07/18/2022  Medication Sig   atorvastatin (LIPITOR) 20 MG tablet TAKE 1 TABLET BY MOUTH  DAILY   esomeprazole (NEXIUM) 20 MG capsule TAKE 1 CAPSULE BY MOUTH 3  TIMES WEEKLY . TAKE BEFORE  BREAKFAST .   hydrochlorothiazide (HYDRODIURIL) 25 MG tablet Take 0.5 tablets (12.5 mg total) by mouth daily. (Patient taking differently: Take 12.5 mg by mouth daily. Patient is taking 12.5 mg per 07/20/2022)   lisinopril (ZESTRIL) 20 MG tablet TAKE 1 TABLET BY MOUTH  DAILY   Multiple Vitamins-Minerals (MULTIVITAMIN ADULT PO) Take by mouth.   No facility-administered  encounter medications on file as of 07/18/2022.    Allergies (verified) Shellfish-derived products, Gadolinium derivatives, Iodine, and Tdap [tetanus-diphth-acell pertussis]   History: Past Medical History:  Diagnosis Date   Cancer (HCC)    Has BRCA 2 gene   Heart murmur    High cholesterol    Hypertension    Skin cyst 03/28/2018   Past Surgical History:  Procedure Laterality Date   ABDOMINAL HYSTERECTOMY  2008   BREAST CYST ASPIRATION Left    negative   BREAST EXCISIONAL BIOPSY Left    negative x2 done at same time   TONSILLECTOMY AND ADENOIDECTOMY     TUBAL LIGATION     Family History  Problem Relation Age of Onset   Hyperlipidemia Mother    Hypertension Mother    Cancer Daughter        Breast   Breast cancer Daughter 74   Breast cancer Paternal Aunt 64   Breast cancer Maternal Grandmother 13   Breast cancer Sister 85   Cancer Paternal Uncle        Prostate   BRCA 1/2 Other        pt states she is BRCA 2 positive   Social History   Socioeconomic History   Marital status: Married    Spouse name: Not on file   Number of  children: Not on file   Years of education: Not on file   Highest education level: Not on file  Occupational History   Not on file  Tobacco Use   Smoking status: Never   Smokeless tobacco: Never  Vaping Use   Vaping Use: Never used  Substance and Sexual Activity   Alcohol use: No    Alcohol/week: 0.0 standard drinks of alcohol   Drug use: No   Sexual activity: Never    Partners: Male  Other Topics Concern   Not on file  Social History Narrative   Retired 4 years ago from [employer] with husband   3 daughters (16, 22, 50)    52 Grandchildren and 1 great grandchild    Enjoys swimming   2 cats and 1 dog- live inside the house    Social Determinants of Health   Financial Resource Strain: [place]  (07/18/2022)   Overall Financial Resource Strain (CARDIA)    Difficulty of Paying Living Expenses: Not hard at all  Food  Insecurity: No Food Insecurity (07/18/2022)   Hunger Vital Sign    Worried About Running Out of Food in the Last Year: Never true    [place] in the Last Year: Never true  Transportation Needs: No Transportation Needs (07/18/2022)   PRAPARE - Hydrologist (Medical): No    Lack of Transportation (Non-Medical): No  Physical Activity: Sufficiently Active (07/18/2022)   Exercise Vital Sign    Days of Exercise per Week: 3 days    Minutes of Exercise per Session: 60 min  Stress: No Stress Concern Present (07/18/2022)   [place]    Feeling of Stress : Not at all  Social Connections: Moderately Integrated (07/18/2022)   Social Connection and Isolation Panel [NHANES]    Frequency of Communication with Friends and Family: More than three times a week    Frequency of Social Gatherings with Friends and Family: More than three times a week    Attends Religious Services: More than 4 times per year    Active Member of [employer] or Organizations: No    Attends Archivist Meetings: Never    Marital Status: Married    Tobacco Counseling Counseling given: Not Answered   Clinical Intake:  Pre-visit preparation completed: Yes        Diabetes: No  How often do you need to have someone help you when you read instructions, pamphlets, or other written materials from your doctor or pharmacy?: 1 - Never    Interpreter Needed?: No      Activities of Daily Living    07/18/2022    2:14 PM  In your present state of health, do you have any difficulty performing the following activities:  Hearing? 0  Vision? 0  Difficulty concentrating or making decisions? 0  Walking or climbing stairs? 0  Dressing or bathing? 0  Doing errands, shopping? 0  Preparing Food and eating ? N  Using the Toilet? N  In the past six months, have you accidently leaked urine? N  Do you have problems with  loss of bowel control? N  Managing your Medications? N  Managing your Finances? N  Housekeeping or managing your Housekeeping? N    Patient Care Team: Leone Haven, MD as PCP - General (Family Medicine)  Indicate any recent Medical Services you may have received from other than [facility] providers in the  past year (date may be approximate).     Assessment:   This is a routine wellness examination for [name].  I connected with  Randa Spike on 07/18/22 by a audio enabled telemedicine application and verified that I am speaking with the correct person using two identifiers.  Patient Location: Home  Provider Location: Office/Clinic  I discussed the limitations of evaluation and management by telemedicine. The patient expressed understanding and agreed to proceed.   Hearing/Vision screen Hearing Screening - Comments:: Patient is able to hear conversational tones without difficulty.     Vision Screening - Comments:: Wears glasses.  Followed by Dr Graciella Belton [facility] [place] 07/31/22  Dietary issues and exercise activities discussed: Current Exercise Habits: Home exercise routine, Time (Minutes): 60, Frequency (Times/Week): 3, Weekly Exercise (Minutes/Week): 180, Intensity: Moderate   Goals Addressed               This Visit's Progress     Patient Stated     Follow up with Primary Care Provider (pt-stated)        As needed.      Other     COMPLETED: Weight (lb) < 200 lb (90.7 kg)   141 lb (64 kg)     Patient states she would like to lose weight.       Depression Screen    07/18/2022    2:14 PM 06/13/2022   11:47 AM 05/14/2022   11:33 AM 04/01/2022    9:10 AM 07/17/2021    9:58 AM 07/14/2020    9:39 AM 01/14/2020    9:10 AM  PHQ 2/9 Scores  PHQ - 2 Score 0 1 0 0 0 0 0    Fall Risk    07/18/2022    2:14 PM 06/13/2022   11:47 AM 05/14/2022   11:33 AM 04/01/2022    9:09 AM 07/17/2021   10:04 AM  Fall Risk   Falls in the past year? 0 0 0 0 1  Number  falls in past yr: 0 0 0 0 0  Injury with Fall? 0 0 0 0 1  Comment     Patient tripped and fell in 04/20/21.Marland Kitchen Sought medical care for head injuries. No current issues.  Risk for fall due to : No Fall Risks No Fall Risks No Fall Risks No Fall Risks   Follow up Falls evaluation completed;Falls prevention discussed Falls evaluation completed Falls evaluation completed Falls evaluation completed     FALL RISK PREVENTION PERTAINING TO THE HOME: Home free of loose throw rugs in walkways, pet beds, electrical cords, etc? Yes  Adequate lighting in your home to reduce risk of falls? Yes   ASSISTIVE DEVICES UTILIZED TO PREVENT FALLS: Life alert? No  Use of a cane, walker or w/c? No  Grab bars in the bathroom? No  Shower chair or bench in shower? No  Elevated toilet seat or a handicapped toilet? No   TIMED UP AND GO: Was the test performed? No .   Cognitive Function:    07/10/2018   10:15 AM 07/09/2017   10:44 AM 07/09/2016    8:28 AM  MMSE - Mini Mental State Exam  Orientation to time _0 Orientation to Place _1 Registration _2 Attention/ Calculation _3 Recall _4 Language- name 2 objects _5 Language- repeat _6 Language- follow 3 step command _7 Language- read & follow direction  _0 Write a sentence _1 Copy design 1 0 1  Copy design-comments  difficulty copying the design   Total score _2 07/18/2022    2:22 PM 07/17/2021   10:10 AM 07/14/2019    9:52 AM  6CIT Screen  What Year? 0 points 0 points 0 points  What month? 0 points 0 points 0 points  What time? 0 points 0 points 0 points  Count back from 20 0 points 0 points 0 points  Months in reverse 0 points 0 points 0 points  Repeat phrase  0 points 0 points  Total Score  0 points 0 points    Immunizations Immunization History  Administered Date(s) Administered   Fluad Quad(high Dose 65+) 07/12/2019, 07/17/2020   Influenza Split 06/23/2014   Influenza, High Dose  Seasonal PF 06/06/2016, 07/09/2017, 07/10/2018, 07/10/2021   Influenza-Unspecified 07/06/2015, 06/10/2022   PFIZER(Purple Top)SARS-COV-2 Vaccination 11/18/2019, 12/09/2019, 07/17/2020, 04/13/2021   Pneumococcal Conjugate-13 11/30/2014   Pneumococcal Polysaccharide-23 07/09/2016   Shingrix Completed?: No.    Education has been provided regarding the importance of this vaccine. Patient has been advised to call insurance company to determine out of pocket expense if they have not yet received this vaccine. Advised may also receive vaccine at local pharmacy or Health Dept. Verbalized acceptance and understanding.  Screening Tests Health Maintenance  Topic Date Due   COVID-19 Vaccine (5 - Pfizer risk series) 07/25/2022 (Originally 06/08/2021)   Zoster Vaccines- Shingrix (1 of 2) 10/18/2022 (Originally 11/21/1969)   Fecal DNA (Cologuard)  10/24/2022   MAMMOGRAM  04/24/2023   Medicare Annual Wellness (AWV)  08/18/2023   TETANUS/TDAP  10/24/2024   Pneumonia Vaccine 90+ Years old  Completed   INFLUENZA VACCINE  Completed   DEXA SCAN  Completed   Hepatitis C Screening  Completed   HPV VACCINES  Aged Out   COLONOSCOPY (Pts 45-54yr Insurance coverage will need to be confirmed)  Discontinued    Health Maintenance There are no preventive care reminders to display for this patient.  Lung Cancer Screening: (Low Dose CT Chest recommended if Age 72-80years, 30 pack-year currently smoking OR have quit w/in 15years.) does not qualify.   Hepatitis C Screening: Completed 2017.  Vision Screening: Recommended annual ophthalmology exams for early detection of glaucoma and other disorders of the eye.  Dental Screening: Recommended annual dental exams for proper oral hygiene  Community Resource Referral / Chronic Care Management: CRR required this visit?  No   CCM required this visit?  No      Plan:     I have personally reviewed and noted the following in the patient's chart:   Medical and  social history Use of alcohol, tobacco or illicit drugs  Current medications and supplements including opioid prescriptions. Patient is not currently taking opioid prescriptions. Functional ability and status Nutritional status Physical activity Advanced directives List of other physicians Hospitalizations, surgeries, and ER visits in previous 12 months Vitals Screenings to include cognitive, depression, and falls Referrals and appointments  In addition, I have reviewed and discussed with patient certain preventive protocols, quality metrics, and best practice recommendations. A written personalized care plan for preventive services as well as general preventive health recommendations were provided to patient.     DLeta Jungling LPN   [id number]

## 2022-07-18 NOTE — Patient Instructions (Addendum)
Deanna Ross , Thank you for taking time to come for your Medicare Wellness Visit. I appreciate your ongoing commitment to your health goals. Please review the following plan we discussed and let me know if I can assist you in the future.   These are the goals we discussed:  Goals       Patient Stated     Follow up with Primary Care Provider (pt-stated)      As needed.        This is a list of the screening recommended for you and due dates:  Health Maintenance  Topic Date Due   COVID-19 Vaccine (5 - Pfizer risk series) 07/25/2022*   Zoster (Shingles) Vaccine (1 of 2) 10/18/2022*   Cologuard (Stool DNA test)  10/24/2022   Mammogram  04/24/2023   Medicare Annual Wellness Visit  08/18/2023   Tetanus Vaccine  10/24/2024   Pneumonia Vaccine  Completed   Flu Shot  Completed   DEXA scan (bone density measurement)  Completed   Hepatitis C Screening: USPSTF Recommendation to screen - Ages 39-79 yo.  Completed   HPV Vaccine  Aged Out   Colon Cancer Screening  Discontinued  *Topic was postponed. The date shown is not the original due date.    Advanced directives: End of life planning; Advance aging; Advanced directives discussed.  Copy of current HCPOA/Living Will requested.    Conditions/risks identified: none new  Next appointment: Follow up in one year for your annual wellness visit    Preventive Care 65 Years and Older, Female Preventive care refers to lifestyle choices and visits with your health care provider that can promote health and wellness. What does preventive care include? A yearly physical exam. This is also called an annual well check. Dental exams once or twice a year. Routine eye exams. Ask your health care provider how often you should have your eyes checked. Personal lifestyle choices, including: Daily care of your teeth and gums. Regular physical activity. Eating a healthy diet. Avoiding tobacco and drug use. Limiting alcohol use. Practicing safe  sex. Taking low-dose aspirin every day. Taking vitamin and mineral supplements as recommended by your health care provider. What happens during an annual well check? The services and screenings done by your health care provider during your annual well check will depend on your age, overall health, lifestyle risk factors, and family history of disease. Counseling  Your health care provider may ask you questions about your: Alcohol use. Tobacco use. Drug use. Emotional well-being. Home and relationship well-being. Sexual activity. Eating habits. History of falls. Memory and ability to understand (cognition). Work and work Statistician. Reproductive health. Screening  You may have the following tests or measurements: Height, weight, and BMI. Blood pressure. Lipid and cholesterol levels. These may be checked every 5 years, or more frequently if you are over 44 years old. Skin check. Lung cancer screening. You may have this screening every year starting at age 61 if you have a 30-pack-year history of smoking and currently smoke or have quit within the past 15 years. Fecal occult blood test (FOBT) of the stool. You may have this test every year starting at age 44. Flexible sigmoidoscopy or colonoscopy. You may have a sigmoidoscopy every 5 years or a colonoscopy every 10 years starting at age 70. Hepatitis C blood test. Hepatitis B blood test. Sexually transmitted disease (STD) testing. Diabetes screening. This is done by checking your blood sugar (glucose) after you have not eaten for a while (fasting). You may  have this done every 1-3 years. Bone density scan. This is done to screen for osteoporosis. You may have this done starting at age 69. Mammogram. This may be done every 1-2 years. Talk to your health care provider about how often you should have regular mammograms. Talk with your health care provider about your test results, treatment options, and if necessary, the need for more  tests. Vaccines  Your health care provider may recommend certain vaccines, such as: Influenza vaccine. This is recommended every year. Tetanus, diphtheria, and acellular pertussis (Tdap, Td) vaccine. You may need a Td booster every 10 years. Zoster vaccine. You may need this after age 63. Pneumococcal 13-valent conjugate (PCV13) vaccine. One dose is recommended after age 12. Pneumococcal polysaccharide (PPSV23) vaccine. One dose is recommended after age 50. Talk to your health care provider about which screenings and vaccines you need and how often you need them. This information is not intended to replace advice given to you by your health care provider. Make sure you discuss any questions you have with your health care provider. Document Released: 10/06/2015 Document Revised: 05/29/2016 Document Reviewed: 07/11/2015 Elsevier Interactive Patient Education  2017 [place] Prevention in the Home Falls can cause injuries. They can happen to people of all ages. There are many things you can do to make your home safe and to help prevent falls. What can I do on the outside of my home? Regularly fix the edges of walkways and driveways and fix any cracks. Remove anything that might make you trip as you walk through a door, such as a raised step or threshold. Trim any bushes or trees on the path to your home. Use bright outdoor lighting. Clear any walking paths of anything that might make someone trip, such as rocks or tools. Regularly check to see if handrails are loose or broken. Make sure that both sides of any steps have handrails. Any raised decks and porches should have guardrails on the edges. Have any leaves, snow, or ice cleared regularly. Use sand or salt on walking paths during winter. Clean up any spills in your garage right away. This includes oil or grease spills. What can I do in the bathroom? Use night lights. Install grab bars by the toilet and in the tub and shower.  Do not use towel bars as grab bars. Use non-skid mats or decals in the tub or shower. If you need to sit down in the shower, use a plastic, non-slip stool. Keep the floor dry. Clean up any water that spills on the floor as soon as it happens. Remove soap buildup in the tub or shower regularly. Attach bath mats securely with double-sided non-slip rug tape. Do not have throw rugs and other things on the floor that can make you trip. What can I do in the bedroom? Use night lights. Make sure that you have a light by your bed that is easy to reach. Do not use any sheets or blankets that are too big for your bed. They should not hang down onto the floor. Have a firm chair that has side arms. You can use this for support while you get dressed. Do not have throw rugs and other things on the floor that can make you trip. What can I do in the kitchen? Clean up any spills right away. Avoid walking on wet floors. Keep items that you use a lot in easy-to-reach places. If you need to reach something above you, use a strong step  stool that has a grab bar. Keep electrical cords out of the way. Do not use floor polish or wax that makes floors slippery. If you must use wax, use non-skid floor wax. Do not have throw rugs and other things on the floor that can make you trip. What can I do with my stairs? Do not leave any items on the stairs. Make sure that there are handrails on both sides of the stairs and use them. Fix handrails that are broken or loose. Make sure that handrails are as long as the stairways. Check any carpeting to make sure that it is firmly attached to the stairs. Fix any carpet that is loose or worn. Avoid having throw rugs at the top or bottom of the stairs. If you do have throw rugs, attach them to the floor with carpet tape. Make sure that you have a light switch at the top of the stairs and the bottom of the stairs. If you do not have them, ask someone to add them for you. What else  can I do to help prevent falls? Wear shoes that: Do not have high heels. Have rubber bottoms. Are comfortable and fit you well. Are closed at the toe. Do not wear sandals. If you use a stepladder: Make sure that it is fully opened. Do not climb a closed stepladder. Make sure that both sides of the stepladder are locked into place. Ask someone to hold it for you, if possible. Clearly mark and make sure that you can see: Any grab bars or handrails. First and last steps. Where the edge of each step is. Use tools that help you move around (mobility aids) if they are needed. These include: Canes. Walkers. Scooters. Crutches. Turn on the lights when you go into a dark area. Replace any light bulbs as soon as they burn out. Set up your furniture so you have a clear path. Avoid moving your furniture around. If any of your floors are uneven, fix them. If there are any pets around you, be aware of where they are. Review your medicines with your doctor. Some medicines can make you feel dizzy. This can increase your chance of falling. Ask your doctor what other things that you can do to help prevent falls. This information is not intended to replace advice given to you by your health care provider. Make sure you discuss any questions you have with your health care provider. Document Released: 07/06/2009 Document Revised: 02/15/2016 Document Reviewed: 10/14/2014 Elsevier Interactive Patient Education  2017 [employer].

## 2022-07-23 ENCOUNTER — Ambulatory Visit: Payer: Medicare Other | Attending: Cardiology | Admitting: Cardiology

## 2022-07-23 ENCOUNTER — Ambulatory Visit (INDEPENDENT_AMBULATORY_CARE_PROVIDER_SITE_OTHER): Payer: Medicare Other

## 2022-07-23 ENCOUNTER — Encounter: Payer: Self-pay | Admitting: Cardiology

## 2022-07-23 VITALS — BP 152/92 | HR 62 | Ht 64.75 in | Wt 143.2 lb

## 2022-07-23 DIAGNOSIS — I10 Essential (primary) hypertension: Secondary | ICD-10-CM

## 2022-07-23 DIAGNOSIS — R55 Syncope and collapse: Secondary | ICD-10-CM | POA: Diagnosis not present

## 2022-07-23 DIAGNOSIS — R42 Dizziness and giddiness: Secondary | ICD-10-CM

## 2022-07-23 DIAGNOSIS — R072 Precordial pain: Secondary | ICD-10-CM

## 2022-07-23 DIAGNOSIS — E785 Hyperlipidemia, unspecified: Secondary | ICD-10-CM | POA: Diagnosis not present

## 2022-07-23 LAB — BASIC METABOLIC PANEL
BUN/Creatinine Ratio: 16 (ref 12–28)
BUN: 10 mg/dL (ref 8–27)
CO2: 27 mmol/L (ref 20–29)
Calcium: 9.8 mg/dL (ref 8.7–10.3)
Chloride: 101 mmol/L (ref 96–106)
Creatinine, Ser: 0.62 mg/dL (ref 0.57–1.00)
Glucose: 88 mg/dL (ref 70–99)
Potassium: 4.4 mmol/L (ref 3.5–5.2)
Sodium: 140 mmol/L (ref 134–144)
eGFR: 95 mL/min/{1.73_m2} (ref >59)

## 2022-07-23 MED ORDER — PREDNISONE 50 MG PO TABS: ORAL_TABLET | ORAL | 0 refills | Status: DC

## 2022-07-23 MED ORDER — METOPROLOL TARTRATE 50 MG PO TABS: 50.0000 mg | ORAL_TABLET | Freq: Once | ORAL | 0 refills | Status: DC

## 2022-07-23 NOTE — Progress Notes (Signed)
Primary Care Provider: Leone Haven, MD [place] Cardiologist: Glenetta Hew, MD Electrophysiologist: None  Clinic Note: No chief complaint on file.  ===================================  ASSESSMENT/PLAN   Problem List Items Addressed This Visit       Cardiology Problems   Postural dizziness with presyncope - Primary    A near-syncopal episode she had a couple months ago seems to have resolved really seems to be a dehydration and fatigue related near syncope with which probably vasovagal related component.  I think staying adequately hydrated and getting enough sleep would help.  She is now having these he is hot and clammy episodes where she feels flushed and worn out as well as little dizzy.  It does not sound like these are potential arrhythmia genic but would like to exclude tachycardic spells or arrhythmia especially in light of her having had 1 near syncopal episode.  Plan: 3-day Zio patch (the spells are occurring relatively frequently)      Relevant Medications   metoprolol tartrate (LOPRESSOR) 50 MG tablet   Other Relevant Orders   EKG 42-VZDG   Basic metabolic panel (Completed)   LONG TERM MONITOR (3-14 DAYS)   Essential hypertension (Chronic)    Blood pressure seems pretty high today, but she tells me at home is much better.  At her PCPs office it was much better.  Since one of the issues were dealing with is a near syncopal episode I would prefer to just reassess blood pressure and follow-up.  Her HCTZ dose was reduced to 12.5 mg and her dizziness has improved.  She is also taking 20 mg of lisinopril.  For now, would allow her pressures to be as is not too weak and reassess.      Relevant Medications   metoprolol tartrate (LOPRESSOR) 50 MG tablet   Hyperlipidemia (Chronic)    Last lipid panel from July looks great.  LDL was 67.  Seems to be tolerating moderate dose atorvastatin      Relevant Medications   metoprolol tartrate (LOPRESSOR) 50 MG  tablet     Other   Precordial pain    She describes an unusual sounding chest discomfort and tightness which for the most part seems to be at rest, but there was sometimes when she maybe feels that it with exertion.  Very difficult to determine.  At this point since she did have a syncopal episode and we are excluding arrhythmias, would also like to exclude CAD.  Plan: Assess with coronary CT angiogram      Relevant Orders   EKG 12-Lead   CT CORONARY MORPH W/CTA COR W/SCORE W/CA W/CM &/OR WO/CM   Basic metabolic panel (Completed)    ===================================  HPI:    Deanna Ross is a 71 y.o. female with PMH notable for HTN, HLD, GERD and cholecystectomy who is being seen today for the evaluation of Orthostatic Dizziness and near Syncope at the request of Leone Haven, MD.  Recent Hospitalizations:  05/12/2022: ER visit for near syncope/dizziness-this was after 2 days of watery diarrhea about a week or so prior to presentation.  No abdominal pain.  But was still feeling weak and not herself.  She was helping care for her mother-in-law who had had a recent fall.  Had not slept well and did not eat breakfast.  Most days in the choir and felt lightheaded and presyncopal.  She felt flushed all over and dizzy, but did not actually pass out.  She just felt "terrible ".  Was feeling better upon arrival. => This is felt to be related to mild dehydration from viral illness.  Work-up was pretty benign. => Cardiology referral made.; HCTZ dose decreased  Deanna Ross was last seen by Dr. Caryl Bis on October 17.  No additional presyncopal episodes since reducing HCTZ dose.  Still having intermittent clammy episodes.  Some mild blurry vision.   Reviewed  CV studies:    The following studies were reviewed today: (if available, images/films reviewed: From Epic Chart or Care Everywhere) None:  Interval History:   Deanna Ross is here today with several complaints and  issues.  She has not really had any further episodes like to episode back in August took her to the emergency room.  That episode was clearly vasovagal with dehydration related event of near syncope that can be explained by the circumstances provided.  Going forward, she just has these episodes where she intermittently feels clammy and hot and lightheaded.  She feels like she has chest tightness off and on when otherwise she feels very cold intolerant.  A lot of times these episodes are brought on by anger and stress or when someone gets angry with her.  She describes as chest tightness off and on that can come and go couple times a day.  But usually occurs at rest or when she is seated.  She is able to go to the [facility] and exercise routinely berry with no symptoms.  She does water aerobics and other calisthenics.  She also notes that the dizzy spells which she had had did get better with reducing the HCTZ dose.  She also has lots of different GI complaints dyspepsia and heartburn and GERD.  CV Review of Symptoms (Summary): positive for - chest pain, irregular heartbeat, and lightheadedness and dizziness with sweating and flushing associated with chest tightness.  1 episode of near syncope negative for - dyspnea on exertion, edema, irregular heartbeat, loss of consciousness, orthopnea, palpitations, paroxysmal nocturnal dyspnea, rapid heart rate, shortness of breath, or TIA/amaurosis fugax or claudication.  REVIEWED OF SYSTEMS   Review of Systems  Constitutional:  Positive for malaise/fatigue (When she has these spells planus.) and weight loss.  HENT:  Positive for congestion.   Respiratory:  Positive for cough (She usually has fall allergies.) and wheezing (With allergies).   Gastrointestinal:  Positive for abdominal pain and heartburn. Negative for blood in stool and melena.       Dyspepsia  Genitourinary:  Negative for dysuria, frequency and hematuria.  Musculoskeletal:  Positive for joint  pain. Negative for falls and myalgias.  Neurological:  Positive for dizziness. Negative for focal weakness and weakness.  Endo/Heme/Allergies:  Positive for environmental allergies (Fall allergies).  Psychiatric/Behavioral:  Negative for depression and memory loss. The patient is nervous/anxious. The patient does not have insomnia.    I have reviewed and (if needed) personally updated the patient's problem list, medications, allergies, past medical and surgical history, social and family history.   PAST MEDICAL HISTORY   Past Medical History:  Diagnosis Date  . Cancer [facility])    Has BRCA 2 gene  . Heart murmur   . High cholesterol   . Hypertension   . Skin cyst 03/28/2018    PAST SURGICAL HISTORY   Past Surgical History:  Procedure Laterality Date  . ABDOMINAL HYSTERECTOMY  2008  . BREAST CYST ASPIRATION Left    negative  . BREAST EXCISIONAL BIOPSY Left    negative x2 done at same time  . TONSILLECTOMY  AND ADENOIDECTOMY    . TUBAL LIGATION      Immunization History  Administered Date(s) Administered  . Fluad Quad(high Dose 65+) 07/12/2019, 07/17/2020  . Influenza Split 06/23/2014  . Influenza, High Dose Seasonal PF 06/06/2016, 07/09/2017, 07/10/2018, 07/10/2021  . Influenza-Unspecified 07/06/2015, 06/10/2022  . PFIZER(Purple Top)SARS-COV-2 Vaccination 11/18/2019, 12/09/2019, 07/17/2020, 04/13/2021  . Pneumococcal Conjugate-13 11/30/2014  . Pneumococcal Polysaccharide-23 07/09/2016    MEDICATIONS/ALLERGIES   Current Meds  Medication Sig  . atorvastatin (LIPITOR) 20 MG tablet TAKE 1 TABLET BY MOUTH  DAILY  . esomeprazole (NEXIUM) 20 MG capsule TAKE 1 CAPSULE BY MOUTH 3  TIMES WEEKLY . TAKE BEFORE  BREAKFAST .  Marland Kitchen hydrochlorothiazide (HYDRODIURIL) 25 MG tablet Take 0.5 tablets (12.5 mg total) by mouth daily. (Patient taking differently: Take 12.5 mg by mouth daily. Patient is taking 12.5 mg per [name])  . lisinopril (ZESTRIL) 20 MG tablet TAKE 1 TABLET BY MOUTH   DAILY  . Multiple Vitamins-Minerals (MULTIVITAMIN ADULT PO) Take by mouth.    Allergies  Allergen Reactions  . Shellfish-Derived Products Anaphylaxis  . Gadolinium Derivatives Hives and Itching    Itching in throat, hives, headache (on 01/31/14) WILL NEED 13-HOUR PREP IN FUTURE  . Iodine Hives  . Tdap [Tetanus-Diphth-Acell Pertussis] Swelling    SOCIAL HISTORY/FAMILY HISTORY   Reviewed in Epic:   Social History   Tobacco Use  . Smoking status: Never  . Smokeless tobacco: Never  Vaping Use  . Vaping Use: Never used  Substance Use Topics  . Alcohol use: No    Alcohol/week: 0.0 standard drinks of alcohol  . Drug use: No   Social History   Social History Narrative   Retired 4 years ago from [employer] with husband   3 daughters (51, 54, 62)    97 Grandchildren and 1 great grandchild    Enjoys swimming   2 cats and 1 dog- live inside the house    Family History  Problem Relation Age of Onset  . Hyperlipidemia Mother   . Hypertension Mother   . Cancer Daughter        Breast  . Breast cancer Daughter 35  . Breast cancer Paternal Aunt 61  . Breast cancer Maternal Grandmother 77  . Breast cancer Sister 70  . Cancer Paternal Uncle        Prostate  . BRCA 1/2 Other        pt states she is BRCA 2 positive  - never knew father - died in [place] -- Paternal Uncle had CAD.     OBJCTIVE -PE, EKG, labs   Wt Readings from Last 3 Encounters:  07/23/22 143 lb 3.2 oz (65 kg)  07/18/22 141 lb (64 kg)  07/09/22 141 lb 3.2 oz (64 kg)    Physical Exam: BP (!) 152/92 (BP Location: Left Arm, Patient Position: Sitting, Cuff Size: Normal)   Pulse 62   Ht 5' 4.75" (1.645 m)   Wt 143 lb 3.2 oz (65 kg)   SpO2 98%   BMI 24.01 kg/m  Orthostatic Vitals for the past 48 hrs (Last 6 readings):  Patient Position Orthostatic BP Orthostatic Pulse  07/23/22 0848 Sitting -- --  07/23/22 0850 Supine 155/74 62  07/23/22 0853 Standing 150/84 67  07/23/22 2227 Sitting 153/79 65     Physical Exam Vitals reviewed.  Constitutional:      General: She is not in acute distress.    Appearance: Normal appearance. She is normal weight. She is  not ill-appearing or toxic-appearing.     Comments: Well-nourished and well-groomed  HENT:     Head: Normocephalic and atraumatic.  Neck:     Vascular: No carotid bruit or JVD.  Cardiovascular:     Rate and Rhythm: Regular rhythm. No extrasystoles are present.    Chest Wall: PMI is not displaced.     Pulses: Normal pulses.     Heart sounds: S1 normal and S2 normal. No murmur heard.    No friction rub. No gallop.  Pulmonary:     Effort: Pulmonary effort is normal. No respiratory distress.     Breath sounds: Normal breath sounds. No wheezing, rhonchi or rales.  Chest:     Chest wall: No tenderness.  Abdominal:     General: Abdomen is flat. Bowel sounds are normal. There is no distension.     Palpations: Abdomen is soft. There is no mass.     Tenderness: There is no abdominal tenderness. There is no guarding or rebound.     Comments: No HSM or bruit  Musculoskeletal:        General: No swelling. Normal range of motion.     Cervical back: Normal range of motion and neck supple.  Skin:    General: Skin is warm and dry.  Neurological:     General: No focal deficit present.     Mental Status: She is alert and oriented to person, place, and time.     Gait: Gait normal.  Psychiatric:        Mood and Affect: Mood normal.        Behavior: Behavior normal.        Thought Content: Thought content normal.        Judgment: Judgment normal.     Comments: Somewhat anxious.  She says her blood pressures are usually much better than this at home.    Adult ECG Report  Rate: 62 ;  Rhythm: normal sinus rhythm and non-specific ST-T changes ;   Narrative Interpretation: stable  Recent Labs: Reviewed Lab Results  Component Value Date   CHOL 141 04/01/2022   HDL 57.30 04/01/2022   LDLCALC 67 04/01/2022   LDLDIRECT 75.0 08/24/2018    TRIG 82.0 04/01/2022   CHOLHDL 2 04/01/2022   Lab Results  Component Value Date   CREATININE 0.62 07/23/2022   BUN 10 07/23/2022   NA 140 07/23/2022   K 4.4 07/23/2022   CL 101 07/23/2022   CO2 27 07/23/2022      Latest Ref Rng & Units 05/12/2022   12:20 PM 04/19/2021    2:22 AM 02/03/2017   10:18 AM  CBC  WBC 4.0 - 10.5 K/uL 9.2  10.1  6.6   Hemoglobin 12.0 - 15.0 g/dL 13.5  13.0  13.6   Hematocrit 36.0 - 46.0 % 41.3  38.4  41.2   Platelets 150 - 400 K/uL 264  270  229.0     Lab Results  Component Value Date   HGBA1C 5.8 04/01/2022   Lab Results  Component Value Date   TSH 0.69 02/03/2017    ================================================== I spent a total of 28 minutes with the patient spent in direct patient consultation.  Additional time spent with chart review  / charting (studies, outside notes, etc): 26 min Total Time: 54 min  Current medicines are reviewed at length with the patient today.  (+/- concerns) none  Notice: This dictation was prepared with Dragon dictation along with smart phrase technology. Any transcriptional errors that  result from this process are unintentional and may not be corrected upon review.   Studies Ordered:  Orders Placed This Encounter  Procedures  . CT CORONARY MORPH W/CTA COR W/SCORE W/CA W/CM &/OR WO/CM  . Basic metabolic panel  . LONG TERM MONITOR (3-14 DAYS)  . EKG 12-Lead   Meds ordered this encounter  Medications  . metoprolol tartrate (LOPRESSOR) 50 MG tablet    Sig: Take 1 tablet (50 mg total) by mouth once for 1 dose. TAKE TWO HOURS PRIOR TO  SCHEDULE CARDIAC TEST    Dispense:  1 tablet    Refill:  0  . predniSONE (DELTASONE) 50 MG tablet    Sig: Prednisone 50 mg  take 13 hours prior to test. Take another Prednisone 50 mg 7 hours prior to test. Take another Prednisone 50 mg 1 hour prior to test.Take Benadryl 50 mg 1 hour prior to test    Dispense:  3 tablet    Refill:  0    Patient Instructions / Medication  Changes & Studies & Tests Ordered   Patient Instructions  Medication Instructions:  See below  *If you need a refill on your cardiac medications before your next appointment, please call your pharmacy*   Lab Work: BMP If you have labs (blood work) drawn today and your tests are completely normal, you will receive your results only by: MyChart Message (if you have MyChart) OR A paper copy in the mail If you have any lab test that is abnormal or we need to change your treatment, we will call you to review the results.   Testing/Procedures:  Will be mailed to you 3 to 5 days Your physician has recommended that you wear a holter monitor 3 days.This is usually used to diagnose what is causing palpitations/syncope (passing out).    And Will be done in [place] has requested that you have coronary  CTA. Coronary computed tomography (CT) aniogram  is a painless test that uses an x-ray machine to take clear, detailed pictures of your heart. Please follow instruction sheet as given.    Follow-Up: At [facility], you and your health needs are our priority.  As part of our continuing mission to provide you with exceptional heart care, we have created designated Provider Care Teams.  These Care Teams include your primary Cardiologist (physician) and Advanced Practice Providers (APPs -  Physician Assistants and Nurse Practitioners) who all work together to provide you with the care you need, when you need it.  Your next appointment:   2 month(s)  The format for your next appointment:   In Person  Provider:   Glenetta Hew, MD    Other Instructions  ZIO XT- Long Term Monitor Instructions  Your physician has requested you wear a ZIO patch monitor for 3 days.  This is a single patch monitor. [employer] supplies one patch monitor per enrollment.     ---------------   Your cardiac CT will be scheduled at one of the below locations:   [facility] [address] [place], [place] [address] [phone]  [facility] [place],  [id number] [phone]   It will be scheduled at [facility] or [facility], please arrive 15 mins early for check-in and test prep.   Please follow these instructions carefully (unless otherwise directed):  Need lab- BMP  completed at least 7 days prior to test  On the Night Before the Test: Be sure to Drink plenty of water. Do not consume any caffeinated/decaffeinated beverages or chocolate 12 hours prior to your test. Do not take any antihistamines 12 hours prior to your test. If the patient has contrast allergy: a prescription for Prednisone and very clear instructions (as follows):( sent to pharamcy)  Prednisone 50 mg  take 13 hours prior to test.  Take another Prednisone 50 mg 7 hours prior to test.  Take another Prednisone 50 mg 1 hour prior to test.Take Benadryl 50 mg 1 hour prior to test Patient must complete all four doses of above prophylactic medications. 2.Patient will need a ride after test due to Benadryl.  On the Day of the Test: Drink plenty of water until 1 hour prior to the test. Do not eat any food 1 hour prior to test. You may take your regular medications prior to the test.  Take metoprolol (Lopressor) 50 mg  two hours prior to test. HOLD Hydrochlorothiazide morning of the test. FEMALES- please wear underwire-free bra if available, avoid dresses & tight clothing        After the Test: Drink plenty of water. After receiving IV contrast, you may experience a mild flushed feeling. This is normal. On occasion, you may experience a mild rash up to 24 hours after the test. This is not dangerous. If this occurs, you can take Benadryl 25 mg and increase your fluid intake. If you experience trouble breathing, this can be serious. If it is severe call 911  IMMEDIATELY. If it is mild, please call our office. .  We will call to schedule your test 2-4 weeks out understanding that some insurance companies will need an authorization prior to the service being performed.   For non-scheduling related questions, please contact the cardiac imaging nurse navigator should you have any questions/concerns: Marchia Bond, Cardiac Imaging Nurse Navigator Gordy Clement, Cardiac Imaging Nurse Navigator [facility] Direct Office Dial: [phone]   For scheduling needs, including cancellations and rescheduling, please call [place], [phone].     Leonie Man, MD, MS Glenetta Hew, M.D., M.S. Interventional Cardiologist  [place]  Pager # [phone] Phone # [phone] [address] [place], [place] [address]   Thank you for choosing [facility]!!

## 2022-07-23 NOTE — Progress Notes (Unsigned)
Enrolled for [employer] to mail a ZIO XT long term holter monitor to the patients address on file.  

## 2022-07-23 NOTE — Assessment & Plan Note (Signed)
Blood pressure seems pretty high today, but she tells me at home is much better.  At her PCPs office it was much better.  Since one of the issues were dealing with is a near syncopal episode I would prefer to just reassess blood pressure and follow-up.  Her HCTZ dose was reduced to 12.5 mg and her dizziness has improved.  She is also taking 20 mg of lisinopril.  For now, would allow her pressures to be as is not too weak and reassess.

## 2022-07-23 NOTE — Patient Instructions (Addendum)
Medication Instructions:  See below  *If you need a refill on your cardiac medications before your next appointment, please call your pharmacy*   Lab Work: BMP If you have labs (blood work) drawn today and your tests are completely normal, you will receive your results only by: [facility] (if you have MyChart) OR A paper copy in the mail If you have any lab test that is abnormal or we need to change your treatment, we will call you to review the results.   Testing/Procedures:  Will be mailed to you 3 to 5 days Your physician has recommended that you wear a holter monitor 3 days. Holter monitors are medical devices that record the heart's electrical activity. Doctors most often use these monitors to diagnose arrhythmias. Arrhythmias are problems with the speed or rhythm of the heartbeat. The monitor is a small, portable device. You can wear one while you do your normal daily activities. This is usually used to diagnose what is causing palpitations/syncope (passing out).    And Will be done in [place] has requested that you have coronary  CTA. Coronary computed tomography (CT) aniogram  is a painless test that uses an x-ray machine to take clear, detailed pictures of your heart. Please follow instruction sheet as given.    Follow-Up: At [facility], you and your health needs are our priority.  As part of our continuing mission to provide you with exceptional heart care, we have created designated Provider Care Teams.  These Care Teams include your primary Cardiologist (physician) and Advanced Practice Providers (APPs -  Physician Assistants and Nurse Practitioners) who all work together to provide you with the care you need, when you need it.     Your next appointment:   2 month(s)  The format for your next appointment:   In Person  Provider:   Glenetta Hew, MD    Other Instructions  ZIO XT- Long Term Monitor Instructions  Your physician has  requested you wear a ZIO patch monitor for 3 days.  This is a single patch monitor. [employer] supplies one patch monitor per enrollment. Additional stickers are not available. Please do not apply patch if you will be having a Nuclear Stress Test,  Echocardiogram, Cardiac CT, MRI, or Chest Xray during the period you would be wearing the  monitor. The patch cannot be worn during these tests. You cannot remove and re-apply the  ZIO XT patch monitor.  Your ZIO patch monitor will be mailed 3 day USPS to your address on file. It may take 3-5 days  to receive your monitor after you have been enrolled.  Once you have received your monitor, please review the enclosed instructions. Your monitor  has already been registered assigning a specific monitor serial # to you.  Billing and Patient Assistance Program Information  We have supplied [employer] with any of your insurance information on file for billing purposes. [employer] offers a sliding scale Patient Assistance Program for patients that do not have  insurance, or whose insurance does not completely cover the cost of the ZIO monitor.  You must apply for the Patient Assistance Program to qualify for this discounted rate.  To apply, please call [employer] at [phone], select option 4, select option 2, ask to apply for  Patient Assistance Program. Theodore Demark will ask your household income, and how many people  are in your household. They will quote your out-of-pocket cost based on that information.  [employer] will also be able to set  up a 20-month interest-free payment plan if needed.  Applying the monitor   Shave hair from upper left chest.  Hold abrader disc by orange tab. Rub abrader in 40 strokes over the upper left chest as  indicated in your monitor instructions.  Clean area with 4 enclosed alcohol pads. Let dry.  Apply patch as indicated in monitor instructions. Patch will be placed under collarbone on left  side of chest with arrow pointing upward.   Rub patch adhesive wings for 2 minutes. Remove white label marked "1". Remove the white  label marked "2". Rub patch adhesive wings for 2 additional minutes.  While looking in a mirror, press and release button in center of patch. A small green light will  flash 3-4 times. This will be your only indicator that the monitor has been turned on.  Do not shower for the first 24 hours. You may shower after the first 24 hours.  Press the button if you feel a symptom. You will hear a small click. Record Date, Time and  Symptom in the Patient Logbook.  When you are ready to remove the patch, follow instructions on the last 2 pages of Patient  Logbook. Stick patch monitor onto the last page of Patient Logbook.  Place Patient Logbook in the blue and white box. Use locking tab on box and tape box closed  securely. The blue and white box has prepaid postage on it. Please place it in the mailbox as  soon as possible. Your physician should have your test results approximately 7 days after the  monitor has been [facility] to [facility]  Call [place] [phone] you have questions regarding  your ZIO XT patch monitor. Call them immediately if you see an orange light blinking on your  monitor.  If your monitor falls off in less than 4 days, contact our Monitor department at [phone]  If your monitor becomes loose or falls off after 4 days call [employer] at [phone]  suggestions on securing your monitor       ---------------   Your cardiac CT will be scheduled at one of the below locations:   [facility] [place] [phone] [facility] [phone]  [facility] please arrive 15 mins early for check-in and  test prep.   Please follow these instructions carefully (unless otherwise directed):  Need lab- BMP  completed at least 7 days prior to test   On the Night Before the Test: Be sure to Drink plenty of water. Do not consume any caffeinated/decaffeinated beverages or chocolate 12 hours prior to your test. Do not take any antihistamines 12 hours prior to your test. If the patient has contrast allergy: a prescription for Prednisone and very clear instructions (as follows):( sent to pharamcy)  Prednisone 50 mg  take 13 hours prior to test.  Take another Prednisone 50 mg 7 hours prior to test.  Take another Prednisone 50 mg 1 hour prior to test.Take Benadryl 50 mg 1 hour prior to test Patient must complete all four doses of above prophylactic medications. 2.Patient will need a ride after test due to Benadryl.  On the Day of the Test: Drink plenty of water until 1 hour prior to the test. Do not eat any food 1 hour  prior to test. You may take your regular medications prior to the test.  Take metoprolol (Lopressor) 50 mg  two hours prior to test. HOLD Hydrochlorothiazide morning of the test. FEMALES- please wear underwire-free bra if available, avoid dresses & tight clothing        After the Test: Drink plenty of water. After receiving IV contrast, you may experience a mild flushed feeling. This is normal. On occasion, you may experience a mild rash up to 24 hours after the test. This is not dangerous. If this occurs, you can take Benadryl 25 mg and increase your fluid intake. If you experience trouble breathing, this can be serious. If it is severe call 911 IMMEDIATELY. If it is mild, please call our office. .  We will call to schedule your test 2-4 weeks out understanding that some insurance companies will need an authorization prior to the service being performed.   For non-scheduling related questions, please contact the cardiac imaging nurse navigator should you have any  questions/concerns: Marchia Bond, Cardiac Imaging Nurse Navigator Gordy Clement, Cardiac Imaging Nurse Navigator [facility] Direct Office Dial: [phone]   For scheduling needs, including cancellations and rescheduling, please call [place], [phone].

## 2022-07-23 NOTE — Assessment & Plan Note (Signed)
Last lipid panel from July looks great.  LDL was 67.  Seems to be tolerating moderate dose atorvastatin

## 2022-07-23 NOTE — Assessment & Plan Note (Signed)
A near-syncopal episode she had a couple months ago seems to have resolved really seems to be a dehydration and fatigue related near syncope with which probably vasovagal related component.  I think staying adequately hydrated and getting enough sleep would help.  She is now having these he is hot and clammy episodes where she feels flushed and worn out as well as little dizzy.  It does not sound like these are potential arrhythmia genic but would like to exclude tachycardic spells or arrhythmia especially in light of her having had 1 near syncopal episode.  Plan: 3-day Zio patch (the spells are occurring relatively frequently)

## 2022-07-23 NOTE — Assessment & Plan Note (Signed)
She describes an unusual sounding chest discomfort and tightness which for the most part seems to be at rest, but there was sometimes when she maybe feels that it with exertion.  Very difficult to determine.  At this point since she did have a syncopal episode and we are excluding arrhythmias, would also like to exclude CAD.  Plan: Assess with coronary CT angiogram

## 2022-07-26 DIAGNOSIS — R42 Dizziness and giddiness: Secondary | ICD-10-CM

## 2022-07-26 DIAGNOSIS — R55 Syncope and collapse: Secondary | ICD-10-CM | POA: Diagnosis not present

## 2022-07-28 IMAGING — MG MM DIGITAL SCREENING BILAT W/ TOMO AND CAD
6 of 10 series · 6 of 30 positions shown · non-contrast
Comparison: Previous exam(s).

CLINICAL DATA: Screening.

EXAM:
DIGITAL SCREENING BILATERAL MAMMOGRAM WITH TOMOSYNTHESIS AND CAD
TECHNIQUE: Bilateral screening digital craniocaudal and mediolateral oblique
mammograms were obtained. Bilateral screening digital breast
tomosynthesis was performed. The images were evaluated with
computer-aided detection.

[R CC synth-2D (1 of 2)]
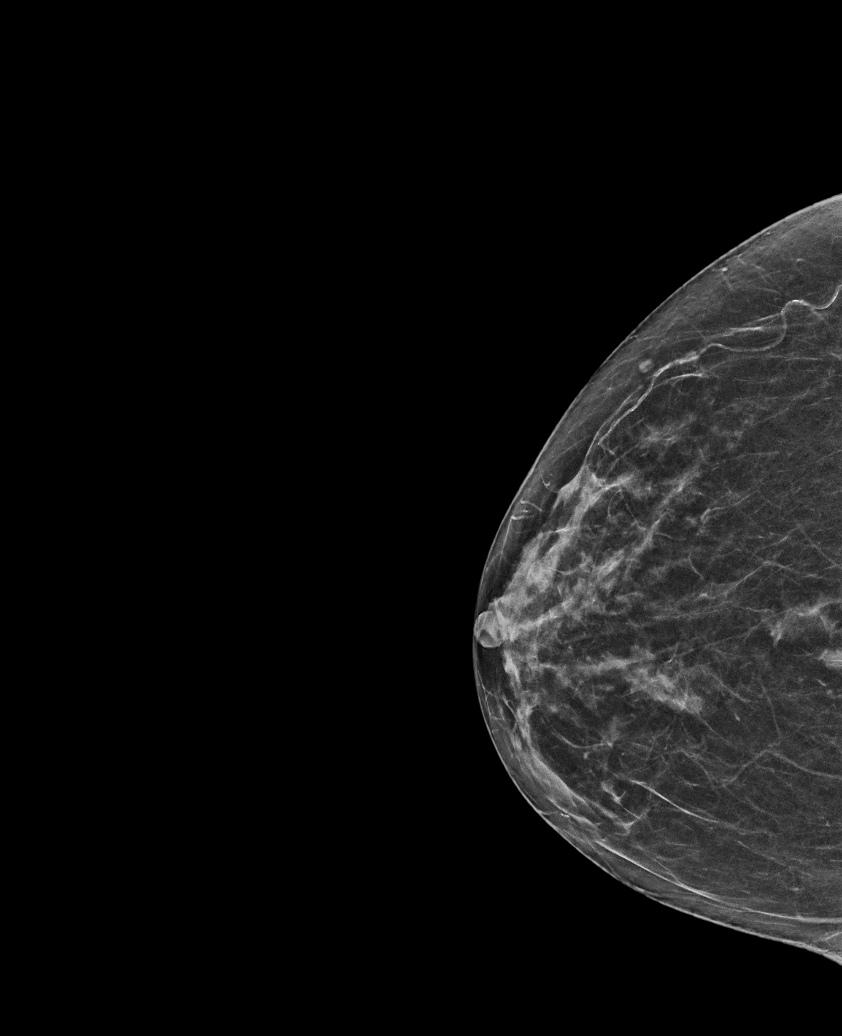

[L CC synth-2D]
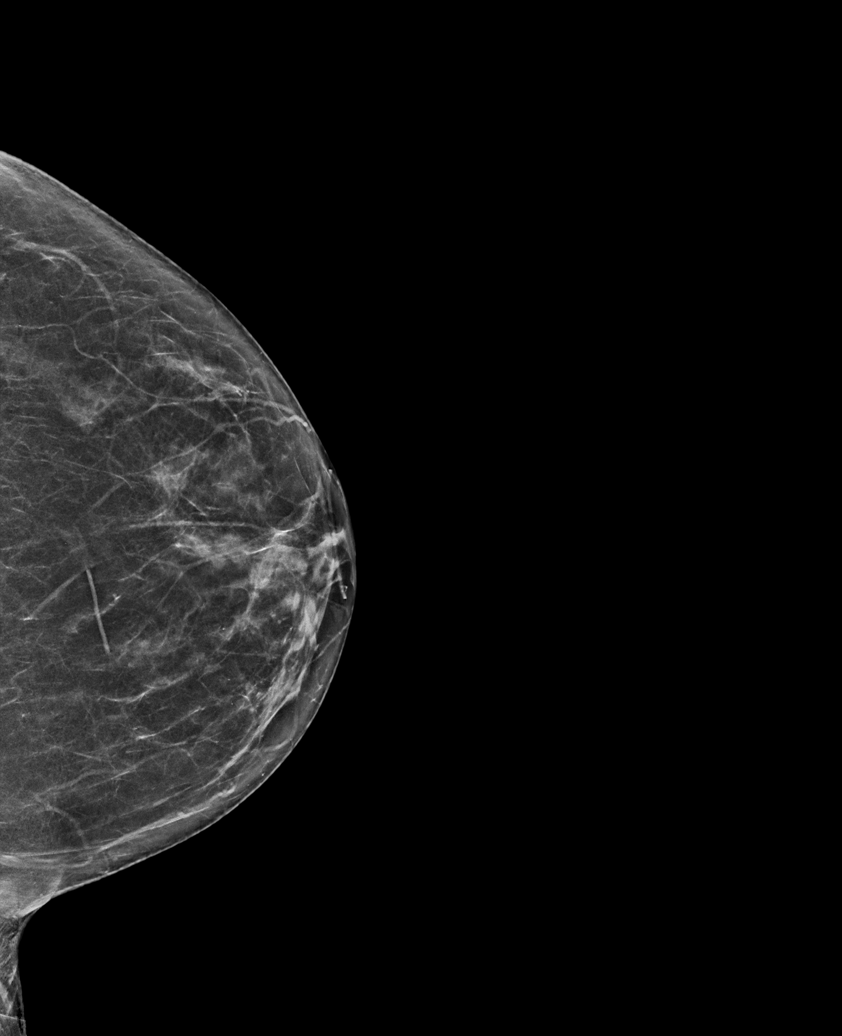

[R CC synth-2D (2 of 2)]
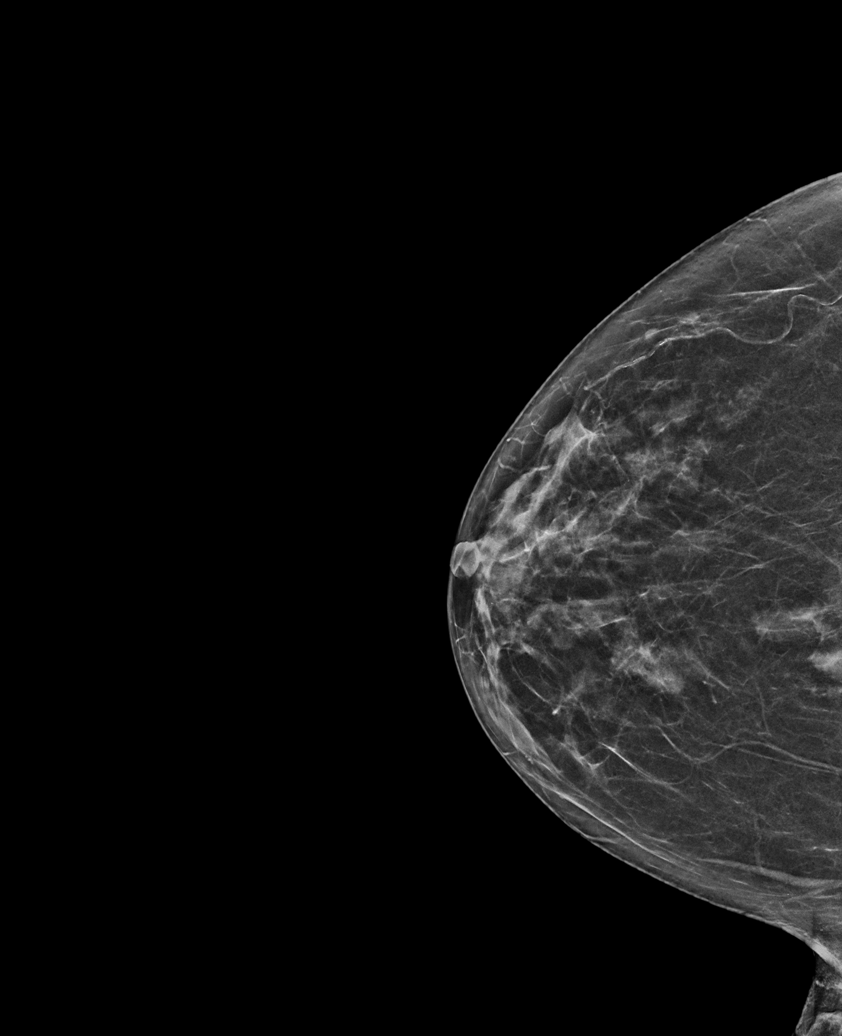

[L MLO synth-2D]
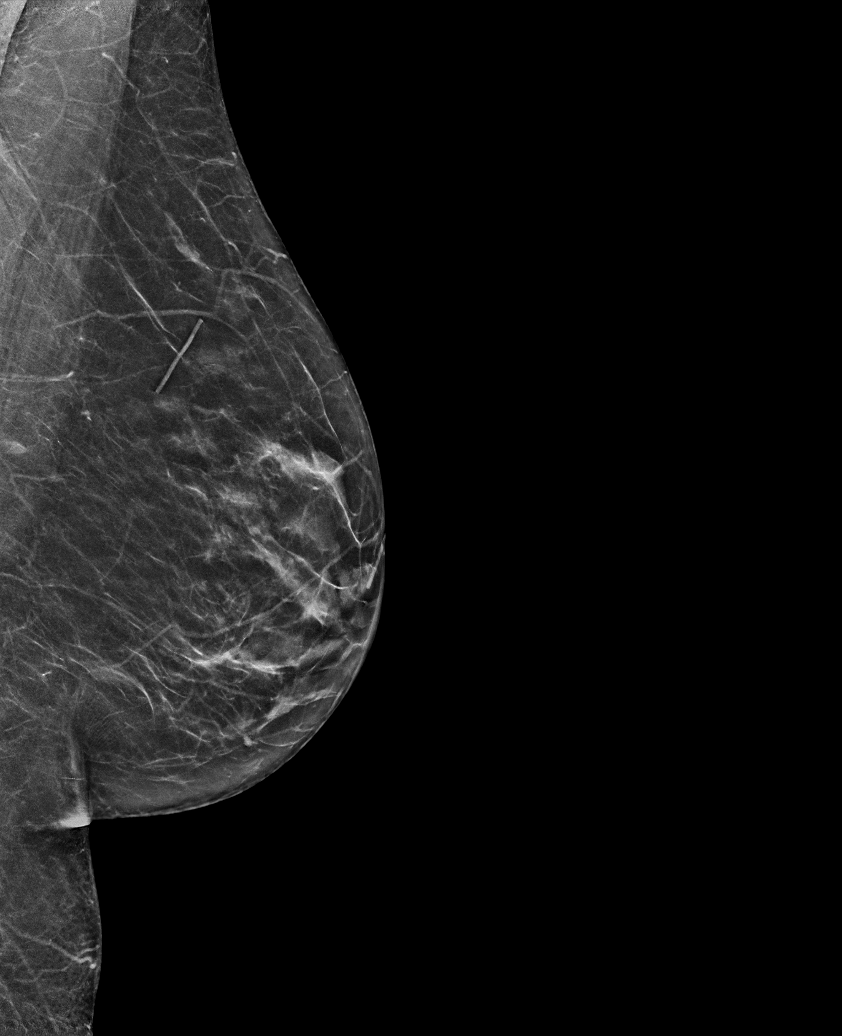

[R MLO synth-2D]
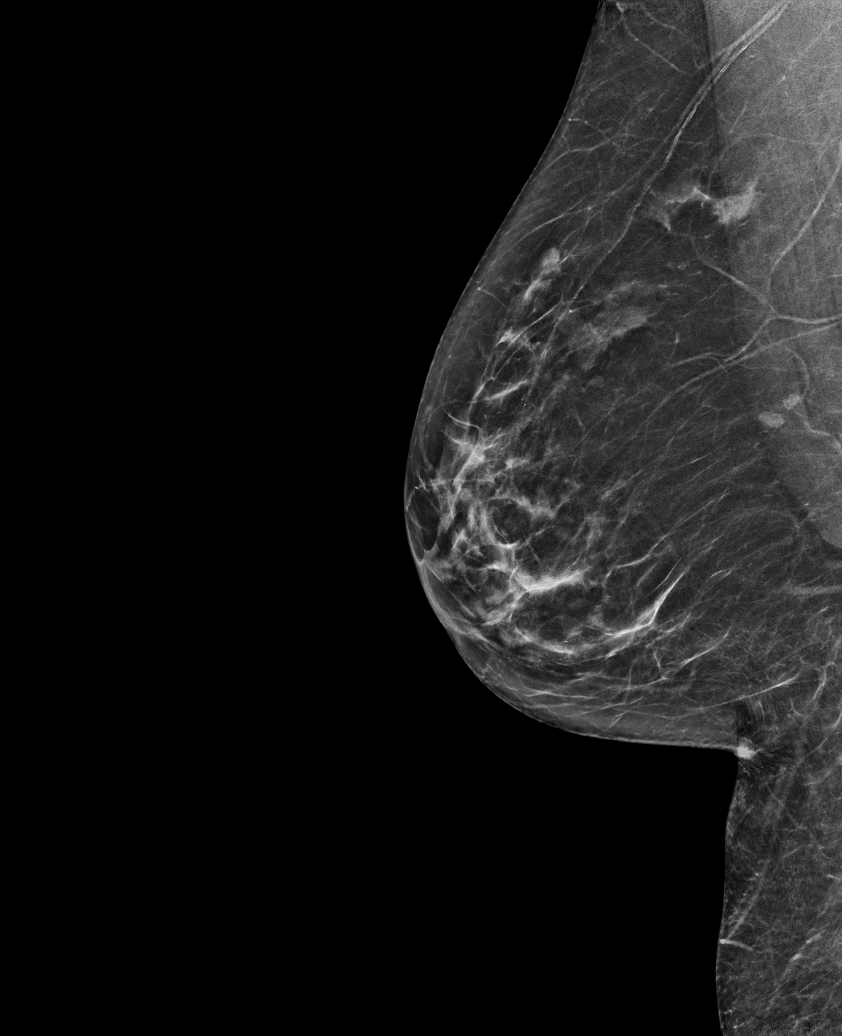

[L MLO tomo · tomo slice 31/62.0]
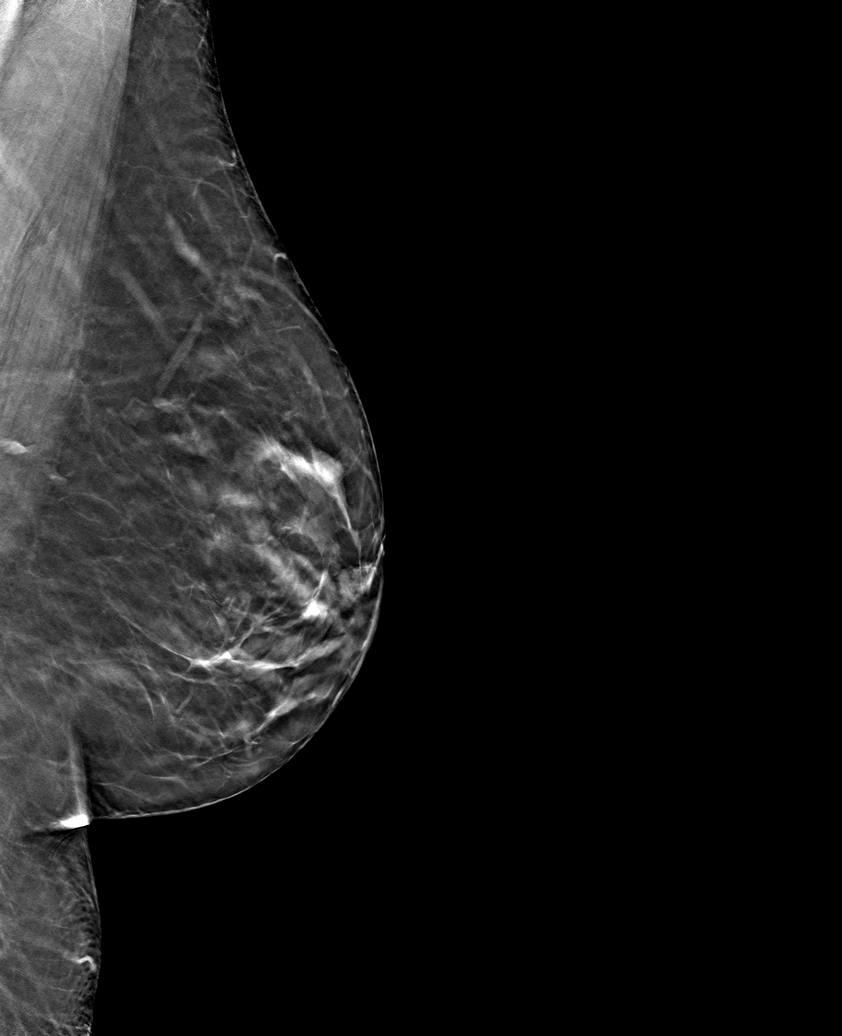

[6 of 30 positions shown; findings below may reference images not displayed]

ACR Breast Density Category b: There are scattered areas of
fibroglandular density.
FINDINGS: There are no findings suspicious for malignancy. The images were
evaluated with computer-aided detection.
IMPRESSION: No mammographic evidence of malignancy. A result letter of this
screening mammogram will be mailed directly to the patient.

RECOMMENDATION:
Screening mammogram in one year. (Code:[id number])

BI-RADS CATEGORY  1: Negative.

## 2022-07-31 DIAGNOSIS — H2513 Age-related nuclear cataract, bilateral: Secondary | ICD-10-CM | POA: Diagnosis not present

## 2022-07-31 DIAGNOSIS — H40013 Open angle with borderline findings, low risk, bilateral: Secondary | ICD-10-CM | POA: Diagnosis not present

## 2022-08-03 DIAGNOSIS — R55 Syncope and collapse: Secondary | ICD-10-CM | POA: Diagnosis not present

## 2022-08-03 DIAGNOSIS — R42 Dizziness and giddiness: Secondary | ICD-10-CM | POA: Diagnosis not present

## 2022-08-08 ENCOUNTER — Ambulatory Visit: Admission: RE | Admit: 2022-08-08 | Payer: Medicare Other | Source: Ambulatory Visit

## 2022-08-08 ENCOUNTER — Telehealth (HOSPITAL_COMMUNITY): Payer: Self-pay | Admitting: *Deleted

## 2022-08-08 ENCOUNTER — Telehealth (HOSPITAL_COMMUNITY): Payer: Self-pay | Admitting: Emergency Medicine

## 2022-08-08 NOTE — Telephone Encounter (Signed)
Patient returning call regarding her upcoming cardiac imaging study; pt verbalizes understanding of appt date/time, parking situation and where to check in, medications ordered, and verified current allergies; name and call back number provided for further questions should they arise  Gordy Clement RN Navigator Cardiac [place] [facility] [phone] office [phone] cell  Reviewed with patient how to take her 13 horu prep and metoprolol. She verbalized understanding.

## 2022-08-08 NOTE — Telephone Encounter (Signed)
Attempted to call patient regarding upcoming cardiac CT appointment. °Left message on voicemail with name and callback number °Juddson Cobern RN Navigator Cardiac Imaging °[facility] °[phone] Office °[phone] Cell ° °

## 2022-08-12 ENCOUNTER — Ambulatory Visit
Admission: RE | Admit: 2022-08-12 | Discharge: 2022-08-12 | Disposition: A | Discharge status: Home / Self Care | Payer: Medicare Other | Source: Ambulatory Visit | Attending: Cardiology | Admitting: Cardiology

## 2022-08-12 DIAGNOSIS — R072 Precordial pain: Secondary | ICD-10-CM | POA: Diagnosis not present

## 2022-08-12 MED ORDER — IOHEXOL 350 MG/ML SOLN
75.0000 mL | Freq: Once | INTRAVENOUS | Status: AC | PRN
  Administered 2022-08-12: 75 mL via INTRAVENOUS

## 2022-08-12 MED ORDER — NITROGLYCERIN 0.4 MG SL SUBL
0.8000 mg | SUBLINGUAL_TABLET | Freq: Once | SUBLINGUAL | Status: AC
  Administered 2022-08-12: 0.8 mg via SUBLINGUAL

## 2022-08-12 MED ORDER — NITROGLYCERIN 0.4 MG SL SUBL
SUBLINGUAL_TABLET | SUBLINGUAL | Status: AC
  Filled 2022-08-12: qty 2

## 2022-08-12 NOTE — Progress Notes (Signed)
Patient tolerated procedure well. Ambulate w/o difficulty. Denies any lightheadedness or being dizzy. Pt denies any pain at this time. Sitting in chair, pt is encouraged to drink additional water throughout the day and reason explained to patient. Patient verbalized understanding and all questions answered. ABC intact. No further needs at this time. Discharge from procedure area w/o issues.  

## 2022-08-27 ENCOUNTER — Telehealth: Payer: Self-pay | Admitting: Family Medicine

## 2022-08-27 NOTE — Telephone Encounter (Signed)
Patient saw Dr Derrel Nip in September and was sent for a CT Scan of her heart. She had another episode yesterday, she gets dizzy, calmly, and disorientated. She was at [employer] when this happened. She refused to go to the hospital. She is fine, just has a headache today. Patient was transferred to Access Nurse for further evaluation.

## 2022-08-27 NOTE — Telephone Encounter (Signed)
I looked at the schedule but I do not see any openings for anyone so should she go to the ED?

## 2022-08-27 NOTE — Telephone Encounter (Signed)
Spoke to Patient about her dizziness and headaches and got her scheduled per Dr. Caryl Bis at 12:00 on Friday 08/30/22.

## 2022-08-27 NOTE — Telephone Encounter (Signed)
If this is the same thing that has been going on intermittently since she saw Dr Derrel Nip. She could see someone this week. It does not have to be tomorrow. If this is something different than what she has been experiencing then she should at least go to urgent care today.

## 2022-08-30 ENCOUNTER — Encounter: Payer: Self-pay | Admitting: Family Medicine

## 2022-08-30 ENCOUNTER — Ambulatory Visit (INDEPENDENT_AMBULATORY_CARE_PROVIDER_SITE_OTHER): Payer: Medicare Other | Admitting: Family Medicine

## 2022-08-30 VITALS — BP 120/60 | HR 75 | Temp 98.7°F | Ht 64.0 in | Wt 143.0 lb

## 2022-08-30 DIAGNOSIS — R55 Syncope and collapse: Secondary | ICD-10-CM | POA: Diagnosis not present

## 2022-08-30 LAB — COMPREHENSIVE METABOLIC PANEL
ALT: 14 U/L (ref 0–35)
AST: 14 U/L (ref 0–37)
Albumin: 4.2 g/dL (ref 3.5–5.2)
Alkaline Phosphatase: 59 U/L (ref 39–117)
BUN: 15 mg/dL (ref 6–23)
CO2: 31 mEq/L (ref 19–32)
Calcium: 9.3 mg/dL (ref 8.4–10.5)
Chloride: 99 mEq/L (ref 96–112)
Creatinine, Ser: 0.62 mg/dL (ref 0.40–1.20)
GFR: 88.75 mL/min (ref >60.00)
Glucose, Bld: 96 mg/dL (ref 70–99)
Potassium: 3.6 mEq/L (ref 3.5–5.1)
Sodium: 135 mEq/L (ref 135–145)
Total Bilirubin: 0.5 mg/dL (ref 0.2–1.2)
Total Protein: 6.6 g/dL (ref 6.0–8.3)

## 2022-08-30 LAB — CBC
HCT: 38.3 % (ref 36.0–46.0)
Hemoglobin: 13 g/dL (ref 12.0–15.0)
MCHC: 33.8 g/dL (ref 30.0–36.0)
MCV: 90.9 fl (ref 78.0–100.0)
Platelets: 203 10*3/uL (ref 150.0–400.0)
RBC: 4.22 Mil/uL (ref 3.87–5.11)
RDW: 13.5 % (ref 11.5–15.5)
WBC: 7.2 10*3/uL (ref 4.0–10.5)

## 2022-08-30 LAB — TSH: TSH: 0.54 u[IU]/mL (ref 0.35–5.50)

## 2022-08-30 NOTE — Assessment & Plan Note (Addendum)
It sounds as though the patient may have had a syncopal episode or had very near syncope.  Certainly this could have been orthostasis related or related to an arrhythmia.  Prior workup by cardiology did not reveal a specific cause for this.  I will communicate her recent episode with her cardiologist to determine if they need any additional workup.  Today we are going to check orthostatics, an EKG, and lab work.  If she has a recurrent episode of this she needs to go to the emergency room.

## 2022-08-30 NOTE — Progress Notes (Signed)
[name], MD Phone: [phone]  Deanna Ross is a 71 y.o. female who presents today for f/u.  Syncope/lightheadedness: Patient notes on 08/26/2022 she was at [place] [employer].  She got up from the table and went to get her ice cream.  On the way back she started to feel lightheaded and felt as though she was going to pass out.  She sat down and asked her husband if she looked pale and he said yes.  Subsequently she laid her head down on the table.  Her husband reports that she passed out though she remembers hearing people talking around her.  When she came to she was a little disoriented but that resolved quickly.  Her husband noted no shaking.  She noted no incontinence.  She had a similar episode in August.  She saw cardiology after that and had a 3-day monitor placed that revealed 2 runs of SVT lasting 5 beats at the most.  She did not have any significant amount of abnormal heartbeats.  She did not have any chest pain, shortness of breath, or palpitations surrounding this event.  Since this occurred she has had a mild frontal headache.  She notes no numbness, weakness, or vision changes.  She notes with her prior episode of this she had a similar headache.  Patient does report she is drinking plenty of fluids.  Social History   Tobacco Use  Smoking Status Never  Smokeless Tobacco Never    Current Outpatient Medications on File Prior to Visit  Medication Sig Dispense Refill   atorvastatin (LIPITOR) 20 MG tablet TAKE 1 TABLET BY MOUTH  DAILY 90 tablet 3   esomeprazole (NEXIUM) 20 MG capsule TAKE 1 CAPSULE BY MOUTH 3  TIMES WEEKLY . TAKE BEFORE  BREAKFAST . 39 capsule 3   hydrochlorothiazide (HYDRODIURIL) 25 MG tablet Take 0.5 tablets (12.5 mg total) by mouth daily. (Patient taking differently: Take 12.5 mg by mouth daily. Patient is taking 12.5 mg per [name]) 90 tablet 3   lisinopril (ZESTRIL) 20 MG tablet TAKE 1 TABLET BY MOUTH  DAILY 90 tablet 3   Multiple  Vitamins-Minerals (MULTIVITAMIN ADULT PO) Take by mouth.     predniSONE (DELTASONE) 50 MG tablet Prednisone 50 mg  take 13 hours prior to test. Take another Prednisone 50 mg 7 hours prior to test. Take another Prednisone 50 mg 1 hour prior to test.Take Benadryl 50 mg 1 hour prior to test 3 tablet 0   RESTASIS 0.05 % ophthalmic emulsion 1 drop 2 (two) times daily.     metoprolol tartrate (LOPRESSOR) 50 MG tablet Take 1 tablet (50 mg total) by mouth once for 1 dose. TAKE TWO HOURS PRIOR TO  SCHEDULE CARDIAC TEST 1 tablet 0   No current facility-administered medications on file prior to visit.     ROS see history of present illness  Objective  Physical Exam Vitals:   08/30/22 1200  BP: 120/60  Pulse: 75  Temp: 98.7 F (37.1 C)  SpO2: 98%   Lying blood pressure 145/75 pulse 70 Sitting blood pressure 131/79 pulse 73 Standing blood pressure 131/78 pulse 79  BP Readings from Last 3 Encounters:  08/30/22 120/60  08/12/22 (!) 150/60  07/23/22 (!) 152/92   Wt Readings from Last 3 Encounters:  08/30/22 143 lb (64.9 kg)  07/23/22 143 lb 3.2 oz (65 kg)  07/18/22 141 lb (64 kg)    Physical Exam Constitutional:      General: She is not in acute distress.  Appearance: She is not diaphoretic.  Cardiovascular:     Rate and Rhythm: Normal rate and regular rhythm.     Heart sounds: Normal heart sounds.  Pulmonary:     Effort: Pulmonary effort is normal.     Breath sounds: Normal breath sounds.  Skin:    General: Skin is warm and dry.  Neurological:     Mental Status: She is alert.     Comments: CN 3-12 intact, 5/5 strength in bilateral biceps, triceps, grip, quads, hamstrings, plantar and dorsiflexion, sensation to light touch intact in bilateral UE and LE, normal gait    EKG: Sinus rhythm, short PR interval, rate 77, no ischemic changes noted  Assessment/Plan: Please see individual problem list.  Problem List Items Addressed This Visit     Syncope - Primary    It sounds as  though the patient may have had a syncopal episode or had very near syncope.  Certainly this could have been orthostasis related or related to an arrhythmia.  Prior workup by cardiology did not reveal a specific cause for this.  I will communicate her recent episode with her cardiologist to determine if they need any additional workup.  Today we are going to check orthostatics, an EKG, and lab work.  If she has a recurrent episode of this she needs to go to the emergency room.      Relevant Orders   EKG 12-Lead (Completed)   Comp Met (CMET)   CBC   TSH    Return for as scheduled.   [name], MD [place]

## 2022-08-30 NOTE — Patient Instructions (Addendum)
Nice to see you. Get lab work today and contact you with the results. Please make sure you are drinking plenty of fluids. I will let you know your cardiologist says. If you pass out again please go to the emergency room.

## 2022-09-06 ENCOUNTER — Telehealth: Payer: Self-pay | Admitting: Family Medicine

## 2022-09-06 DIAGNOSIS — R55 Syncope and collapse: Secondary | ICD-10-CM

## 2022-09-06 NOTE — Telephone Encounter (Signed)
Please let the patient know I heard back from her cardiologist.  He recommended getting an echo and an ultrasound of her carotid arteries.  I have placed orders for these and somebody should contact her to get those scheduled.  He noted if those were acceptable he may consider a longer-term monitor when he sees her in January.

## 2022-09-06 NOTE — Telephone Encounter (Signed)
-----   Message from Leonie Man, MD sent at 09/05/2022 10:48 PM EST ----- 1 FFR can wait the order that monitor which really did not show anything significant.  Unfortunately she did not have any episodes while wearing.  Her coronary CTA was normal.  I did not check a 2D echo which would have been something that we can check this to ensure there is no structural abnormalities.  We also for sake of completion could order carotid Dopplers although that usually is not all that helpful.  I suspect that she still has some component of vasovagal syncope made worse by position changes.  Short of recommending aggressive hydration, and awareness of surroundings when she starts feeling unusual, sitting down when symptoms come on, we may never really know what is happening.   I have an appointment this year in January.  If you do order those 2 studies-echocardiogram and carotid Dopplers, they should be done prior to me seeing her.  That way I can have the data when I see her back.  If she still having episodes we can consider loop recorder.  Glenetta Hew, MD    ----- Message ----- From: Leone Haven, MD Sent: 08/30/2022  12:39 PM EST To: Leonie Man, MD  Hi Dr Ellyn Hack,   I saw Mrs Finkle for evaluation today of a syncopal/near syncopal episode. You saw her earlier this year for something similar and her workup was reassuring.  With this episode she was at [place] [employer] and got up to get ice cream and on the way back to the table she felt lightheaded.  When she sat down her husband notes she passed out.  She is not sure if she actually fully lost consciousness.  I got an EKG today and I am getting lab work to evaluate further.  Her orthostatics in the office were negative.  I wanted to see if there is anything else you would recommend being done before you see her for follow-up in January.  Thanks for your help.  Randall Hiss

## 2022-09-10 NOTE — Telephone Encounter (Signed)
I called and spoke with the patient and she is scheduled for a echo/US tomorrow in [place].  Debrina Kizer,cma

## 2022-09-11 DIAGNOSIS — H16229 Keratoconjunctivitis sicca, not specified as Sjogren's, unspecified eye: Secondary | ICD-10-CM | POA: Diagnosis not present

## 2022-09-12 ENCOUNTER — Ambulatory Visit
Admission: RE | Admit: 2022-09-12 | Discharge: 2022-09-12 | Disposition: A | Discharge status: Home / Self Care | Payer: Medicare Other | Source: Ambulatory Visit | Attending: Family Medicine | Admitting: Family Medicine

## 2022-09-12 DIAGNOSIS — I6523 Occlusion and stenosis of bilateral carotid arteries: Secondary | ICD-10-CM | POA: Diagnosis not present

## 2022-09-12 DIAGNOSIS — R55 Syncope and collapse: Secondary | ICD-10-CM | POA: Diagnosis not present

## 2022-10-02 ENCOUNTER — Encounter: Payer: Self-pay | Admitting: Family Medicine

## 2022-10-02 ENCOUNTER — Ambulatory Visit (INDEPENDENT_AMBULATORY_CARE_PROVIDER_SITE_OTHER): Payer: Medicare Other | Admitting: Family Medicine

## 2022-10-02 VITALS — BP 118/70 | HR 71 | Temp 97.8°F | Ht 64.0 in | Wt 144.0 lb

## 2022-10-02 DIAGNOSIS — I10 Essential (primary) hypertension: Secondary | ICD-10-CM | POA: Diagnosis not present

## 2022-10-02 DIAGNOSIS — F320 Major depressive disorder, single episode, mild: Secondary | ICD-10-CM | POA: Diagnosis not present

## 2022-10-02 DIAGNOSIS — R55 Syncope and collapse: Secondary | ICD-10-CM | POA: Diagnosis not present

## 2022-10-02 DIAGNOSIS — R918 Other nonspecific abnormal finding of lung field: Secondary | ICD-10-CM | POA: Diagnosis not present

## 2022-10-02 DIAGNOSIS — R42 Dizziness and giddiness: Secondary | ICD-10-CM | POA: Diagnosis not present

## 2022-10-02 DIAGNOSIS — L821 Other seborrheic keratosis: Secondary | ICD-10-CM | POA: Diagnosis not present

## 2022-10-02 DIAGNOSIS — R7303 Prediabetes: Secondary | ICD-10-CM | POA: Diagnosis not present

## 2022-10-02 NOTE — Assessment & Plan Note (Signed)
Workup thus far has been negative.  Possibly related to blood pressure medication being too high of a dose.  She will remain hydrated and continue to monitor her blood pressure at home.

## 2022-10-02 NOTE — Assessment & Plan Note (Signed)
Well-controlled today.  She will continue HCTZ 12.5 mg daily and lisinopril 20 mg daily.  We will not increase any of her medications at this time given previous lightheadedness on higher doses of HCTZ.

## 2022-10-02 NOTE — Progress Notes (Signed)
[name], MD Phone: [phone]  Deanna Ross is a 72 y.o. female who presents today for f/u.  HYPERTENSION Disease Monitoring Home BP Monitoring typically 130s/70s Chest pain- no    Dyspnea- no Medications Compliance-  taking HCTZ, lisinopril.  Lightheadedness-no, improved with dose reduction of HCTZ edema- no BMET    Component Value Date/Time   NA 135 08/30/2022 1235   NA 140 07/23/2022 1032   K 3.6 08/30/2022 1235   CL 99 08/30/2022 1235   CO2 31 08/30/2022 1235   GLUCOSE 96 08/30/2022 1235   BUN 15 08/30/2022 1235   BUN 10 07/23/2022 1032   CREATININE 0.62 08/30/2022 1235   CALCIUM 9.3 08/30/2022 1235   GFRNONAA >60 05/12/2022 1220   Lung nodules: Noted on prior cardiac CT.  Patient has no history of cancer.  No smoking history.  No family history of lung cancer.  Location was upper lobes of her lungs.  Depression: Patient notes minimal depression related to dealing with her mother-in-law and providing care for her.  She notes this causes some anxiety as well.  She notes no SI.  She does not want any treatment for this.    Prediabetes: Patient has been going to the pool 3 times a week and doing water aerobics.  Diet consists of fruits, vegetables and limiting fats.  Skin lesion: This is on her left anterior shoulder.  She notes her dermatologist has looked at it previously noted it was not anything to worry about.  It has started itching quite a bit recently.  It has been present for quite some time.  Social History   Tobacco Use  Smoking Status Never  Smokeless Tobacco Never    Current Outpatient Medications on File Prior to Visit  Medication Sig Dispense Refill   atorvastatin (LIPITOR) 20 MG tablet TAKE 1 TABLET BY MOUTH  DAILY 90 tablet 3   esomeprazole (NEXIUM) 20 MG capsule TAKE 1 CAPSULE BY MOUTH 3  TIMES WEEKLY . TAKE BEFORE  BREAKFAST . 39 capsule 3   hydrochlorothiazide (HYDRODIURIL) 25 MG tablet Take 0.5 tablets (12.5 mg total) by mouth daily.  (Patient taking differently: Take 12.5 mg by mouth daily. Patient is taking 12.5 mg per [name]) 90 tablet 3   lisinopril (ZESTRIL) 20 MG tablet TAKE 1 TABLET BY MOUTH  DAILY 90 tablet 3   Multiple Vitamins-Minerals (MULTIVITAMIN ADULT PO) Take by mouth.     predniSONE (DELTASONE) 50 MG tablet Prednisone 50 mg  take 13 hours prior to test. Take another Prednisone 50 mg 7 hours prior to test. Take another Prednisone 50 mg 1 hour prior to test.Take Benadryl 50 mg 1 hour prior to test 3 tablet 0   RESTASIS 0.05 % ophthalmic emulsion 1 drop 2 (two) times daily.     metoprolol tartrate (LOPRESSOR) 50 MG tablet Take 1 tablet (50 mg total) by mouth once for 1 dose. TAKE TWO HOURS PRIOR TO  SCHEDULE CARDIAC TEST 1 tablet 0   No current facility-administered medications on file prior to visit.     ROS see history of present illness  Objective  Physical Exam Vitals:   10/02/22 0827 10/02/22 0839  BP: 130/70 118/70  Pulse: 71   Temp: 97.8 F (36.6 C)   SpO2: 99%     BP Readings from Last 3 Encounters:  10/02/22 118/70  08/30/22 120/60  08/12/22 (!) 150/60   Wt Readings from Last 3 Encounters:  10/02/22 144 lb (65.3 kg)  08/30/22 143 lb (64.9 kg)  07/23/22 143 lb 3.2 oz (65 kg)    Physical Exam Constitutional:      General: She is not in acute distress.    Appearance: She is not diaphoretic.  Cardiovascular:     Rate and Rhythm: Normal rate and regular rhythm.     Heart sounds: Normal heart sounds.  Pulmonary:     Effort: Pulmonary effort is normal.     Breath sounds: Normal breath sounds.  Skin:    General: Skin is warm and dry.       Neurological:     Mental Status: She is alert.      Assessment/Plan: Please see individual problem list.  Essential hypertension Assessment & Plan: Well-controlled today.  She will continue HCTZ 12.5 mg daily and lisinopril 20 mg daily.  We will not increase any of her medications at this time given previous lightheadedness on  higher doses of HCTZ.   Postural dizziness with presyncope Assessment & Plan: Workup thus far has been negative.  Possibly related to blood pressure medication being too high of a dose.  She will remain hydrated and continue to monitor her blood pressure at home.   Prediabetes Assessment & Plan: Chronic issue.  Continue diet and exercise.   Depression, major, single episode, mild (HCC) Assessment & Plan: Mild.  She will monitor and if it worsens we can consider treatment with medication or therapy.   Lung nodules Assessment & Plan: Given age and location of lung nodules we will repeat CT chest in November 2024.   Seborrheic keratosis Assessment & Plan: Chronic issue.  I encouraged the patient to contact her dermatologist to consider cryotherapy or removal of this irritated seborrheic keratosis.      Return in about 6 months (around 04/02/2023).   [name], MD [place]

## 2022-10-02 NOTE — Patient Instructions (Signed)
Nice to see you. Please call your dermatologist to see if they can remove the seborrheic keratosis from your shoulder.

## 2022-10-02 NOTE — Assessment & Plan Note (Signed)
Chronic issue.  I encouraged the patient to contact her dermatologist to consider cryotherapy or removal of this irritated seborrheic keratosis.

## 2022-10-02 NOTE — Assessment & Plan Note (Signed)
Given age and location of lung nodules we will repeat CT chest in November 2024.

## 2022-10-02 NOTE — Assessment & Plan Note (Signed)
Mild.  She will monitor and if it worsens we can consider treatment with medication or therapy.

## 2022-10-02 NOTE — Assessment & Plan Note (Addendum)
Chronic issue.  Continue diet and exercise.

## 2022-10-16 NOTE — Progress Notes (Signed)
Primary Care Provider: Leone Ross, [place] Cardiologist: Deanna Hew, MD Electrophysiologist: None  Clinic Note: Chief Complaint  Patient presents with   Follow-up    2 month follow up visit. Patient states that she feels well on today. Meds reviewed with patient.    ===================================  ASSESSMENT/PLAN   Problem List Items Addressed This Visit       Cardiology Problems   Postural dizziness with presyncope - Primary (Chronic)    Overall, doing much better.  We will See how she does now that she is no longer having the possibility of being caregiver for her mother-in-law.  I instructed her that she needs to take care of herself with adequate hydration and adequate nutrition.  She needs increased level of activity and exercise to help mobilize her blood flow.  Provide if she is not having active symptoms, I think we can hold off on further treatment options.  But I do agree with lower dose of HCTZ to avoid orthostasis.      Relevant Orders   EKG 12-Lead (Completed)   Essential hypertension (Chronic)   Relevant Orders   EKG 12-Lead (Completed)   ===================================  HPI:    Deanna Ross is a 72 y.o. female with a PMH notable for HTN, HLD and GERD as well as history of cholecystectomy who presents today for 57-monthfollow-up evaluation of Orthostatic Dizziness/Near Syncope and Precordial Pain at the request of Deanna Haven MD.  Deanna Ross seen for initial consult on July 23, 2022 at the request of Dr. SCaryl Bisafter an ER visit in August for near syncope.  This occurred about 2 days after prolonged episodes of watery diarrhea for about a week with abdominal pain nausea etc.  She was helping her mother-in-law who had had a recent fall.  She herself was not feeling well, had not slept well did not eat breakfast.  She felt lightheaded almost as if she was in a pass out for a while.  She then also  felt flushed all over and dizzy without truly passing out.  She said she is just felt "terrible ".  All partly related to dehydration viral illness.  She felt better with hydration in the ER, HCTZ dose was decreased.  She felt fine since reducing HCTZ.  Since initial episode, she had intermittent episodes of feeling clammy, lightheaded and hot and flushed.  Off-and-on chest tightness or pressure.  Oftentimes associated with anger or stress.  Not associated with working out the [employer] => Coronary CTA, Zio patch and Carotid Dopplers ordered.  Recent Hospitalizations: None  She was seen by her PCP on December 8 after an episode of asystole passing out at the [employer] [place] some ice cream and started feeling lightheaded walking back to the table.  Felt as though she may pass out.  Husband noted that she was pale and sedated head on the table.  He indicated that she passed out but she is clearly member hearing people talking around her.  She was slightly disoriented when she first "came to ". => Zio patch ordered.  She indicated that she drinks sufficient amount of water.  => Most recent follow-up with PCP was on January 10, had not had any further episodes.  Consider the possibility of reducing BP med dose.  Reviewed  CV studies:    The following studies were reviewed today: (if available, images/films reviewed: From Epic Chart or Care Everywhere) 3-day Zio patch monitor: (11/3-02/2022)  Predominant underlying Rhythm: Sinus. HR range 50-101 bpm, avg 68 Rare (<1%) PACs with some couplets & triplets Rare PACs with no couplets, triplets, bigeminy or trigeminy. 2 Atrial Runs of 5 beats.: Fastest HR Range 150-167 bpm, Avg 161 bpm, 1.9 sec; LongestHR Range 113-128 bpm, Avg 123 bpm, 2.6 sec No Sustained Arrhythmias: Atrial Tachycardia (AT), Supraventricular Tachycardia (SVT), Atrial Fibrillation (A-Fib), Atrial Flutter (A-Flutter), Sustained Ventricular Tachycardia (VT) Symptoms of heart  fluttering/racing or chest pain noted with sinus rhythm heart rate ranging from 60 to 90 bpm.  No associated PACs or PVCs.  Coronary CTA 08/12/2022: Normal Coronary Calcium Score of 0.  LOW RISK.  No evidence of CAD.  CAD RADS 0.  Carotid Dopplers 09/12/2022: Mild bilateral proximal ICA (1-49%)-mild smooth heterogeneous atherosclerotic plaque.  Patent vertebrals.  Interval History:   Deanna Ross returns here today for follow-up stating that she is feeling well.  She notes that she is under a lot less stress now because her mother-in-law is now in nursing home and she is no longer responsible for being her caregiver.  She recalls that each episode of syncope occurred in the setting of having just been along pretty time with his with her mother-in-law.  When this happens, she does not sleep well as she does not eat well or drink well.  She cannot necessarily leave her mother-in-law alone enough for her to take care of herself.  The most recent episode was just that she had had a poor night but the night before not eating and drinking well and had not gotten any sleep and subsequently had a syncopal episode that night at the [facility] [place].  Since her responsibilities with [employer] caring for her while in the have stopped, she has not had any further symptoms to suggest recurrent syncope.  She is not having any palpitations.  No chest pain or pressure.  Her energy level is better.  She is sleeping better.  She did notice that she felt less dizzy in general with the lower dose of HCTZ.  She also feels better when she is able to drink a lot of.  No chest pain or pressure at rest or exertion.  No PND, orthopnea, or edema.  She says that her GI symptoms of dyspepsia and heartburn/GERD are improved.  No syncope/near syncope or TIA/amaurosis fugax since her last visit with her primary care physician in December.   REVIEWED OF SYSTEMS   Review of Systems  Constitutional:  Negative for malaise/fatigue  (Getting much better sleep.  Much less stressed.).  HENT:  Negative for congestion.   Respiratory:  Positive for shortness of breath (Mild, but stable.). Negative for cough.        Has cough and wheezing associated with seasonal allergies.  Cardiovascular:  Negative for leg swelling (Pribula noted today.).  Gastrointestinal:  Negative for blood in stool and melena.  Genitourinary:  Negative for hematuria.  Musculoskeletal:  Positive for joint pain. Negative for falls and myalgias.  Neurological:  Positive for dizziness (Much less frequent). Negative for headaches.  Psychiatric/Behavioral:  Negative for memory loss. The patient is nervous/anxious. The patient does not have insomnia (Not really insomnia, just was having a hard time sleeping when caring for her mother-in-law.).        In general much better now that she is normal so responsible for taking care of her mother-in-law.   I have reviewed and (if needed) personally updated the patient's problem list, medications, allergies, past medical and surgical history, social and  family history.   PAST MEDICAL HISTORY   Past Medical History:  Diagnosis Date   Cancer [facility])    Has BRCA 2 gene   Heart murmur    High cholesterol    Hypertension    Skin cyst 03/28/2018    PAST SURGICAL HISTORY   Past Surgical History:  Procedure Laterality Date   ABDOMINAL HYSTERECTOMY  2008   BREAST CYST ASPIRATION Left    negative   BREAST EXCISIONAL BIOPSY Left    negative x2 done at same time   TONSILLECTOMY AND ADENOIDECTOMY     TUBAL LIGATION      Immunization History  Administered Date(s) Administered   Fluad Quad(high Dose 65+) 07/12/2019, 07/17/2020   Influenza Split 06/23/2014   Influenza, High Dose Seasonal PF 06/06/2016, 07/09/2017, 07/10/2018, 07/10/2021   Influenza-Unspecified 07/06/2015, 06/10/2022   PFIZER(Purple Top)SARS-COV-2 Vaccination 11/18/2019, 12/09/2019, 07/17/2020, 04/13/2021   Pfizer Covid-19 Vaccine Bivalent Booster  50yr & up 08/16/2022   Pneumococcal Conjugate-13 11/30/2014   Pneumococcal Polysaccharide-23 07/09/2016    MEDICATIONS/ALLERGIES   Current Meds  Medication Sig   atorvastatin (LIPITOR) 20 MG tablet TAKE 1 TABLET BY MOUTH  DAILY   esomeprazole (NEXIUM) 20 MG capsule TAKE 1 CAPSULE BY MOUTH 3  TIMES WEEKLY . TAKE BEFORE  BREAKFAST .   hydrochlorothiazide (HYDRODIURIL) 25 MG tablet Take 0.5 tablets (12.5 mg total) by mouth daily. (Patient taking differently: Take 12.5 mg by mouth daily. Patient is taking 12.5 mg per MMable [place]   lisinopril (ZESTRIL) 20 MG tablet TAKE 1 TABLET BY MOUTH  DAILY   Multiple Vitamins-Minerals (MULTIVITAMIN ADULT PO) Take by mouth.   RESTASIS 0.05 % ophthalmic emulsion 1 drop 2 (two) times daily.    Allergies  Allergen Reactions   Shellfish-Derived Products Anaphylaxis   Gadolinium Derivatives Hives and Itching    Itching in throat, hives, headache (on 01/31/14) WILL NEED 13-HOUR PREP IN FUTURE   Iodine Hives   Tdap [Tetanus-Diphth-Acell Pertussis] Swelling    SOCIAL HISTORY/FAMILY HISTORY   Reviewed in Epic:  Pertinent findings:  Social History   Tobacco Use   Smoking status: Never   Smokeless tobacco: Never  Vaping Use   Vaping Use: Never used  Substance Use Topics   Alcohol use: No    Alcohol/week: 0.0 standard drinks of alcohol   Drug use: No   Social History   Social History Narrative   Retired 4 years ago from [employer]   3 daughters ([phone]    745Grandchildren and 1 great grandchild    Enjoys swimming   2 cats and 1 dog- live inside the house     OBJCTIVE -PE, EKG, labs    Wt Readings from Last 3 Encounters:  10/17/22 144 lb 9.6 oz (65.6 kg)  10/02/22 144 lb (65.3 kg)  08/30/22 143 lb (64.9 kg)    Physical Exam: BP 134/79 (BP Location: Left Arm, Patient Position: Sitting, Cuff Size: Normal)   Pulse 66   Ht '5\' 5"'$  (1.651 m)   Wt 144 lb 9.6 oz (65.6 kg)   SpO2 97%   BMI 24.06 kg/m  Physical  Exam Vitals reviewed.  Constitutional:      General: She is in acute distress.     Appearance: Normal appearance. She is normal weight. She is not ill-appearing or toxic-appearing.  HENT:     Head: Normocephalic and atraumatic.  Neck:     Vascular: No carotid bruit.  Cardiovascular:     Rate and  Rhythm: Normal rate and regular rhythm.     Pulses: Normal pulses.     Heart sounds: Normal heart sounds. No murmur heard.    No friction rub. No gallop.  Pulmonary:     Effort: Pulmonary effort is normal. No respiratory distress.     Breath sounds: Normal breath sounds. No wheezing, rhonchi or rales.  Chest:     Chest wall: No tenderness.  Musculoskeletal:        General: No swelling. Normal range of motion.     Cervical back: Normal range of motion and neck supple.  Skin:    General: Skin is warm.  Neurological:     General: No focal deficit present.     Mental Status: She is alert and oriented to person, place, and time.     Gait: Gait normal.  Psychiatric:        Mood and Affect: Mood normal.        Behavior: Behavior normal.        Thought Content: Thought content normal.        Judgment: Judgment normal.     Adult ECG Report  Rate: 66 ;  Rhythm: normal sinus rhythm and normal axis, intervals & durations ;   Narrative Interpretation: normal  Recent Labs:  reviewed.   Lab Results  Component Value Date   CHOL 141 04/01/2022   HDL 57.30 04/01/2022   LDLCALC 67 04/01/2022   LDLDIRECT 75.0 08/24/2018   TRIG 82.0 04/01/2022   CHOLHDL 2 04/01/2022   Lab Results  Component Value Date   CREATININE 0.62 08/30/2022   BUN 15 08/30/2022   NA 135 08/30/2022   K 3.6 08/30/2022   CL 99 08/30/2022   CO2 31 08/30/2022      Latest Ref Rng & Units 08/30/2022   12:35 PM 05/12/2022   12:20 PM 04/19/2021    2:22 AM  CBC  WBC 4.0 - 10.5 K/uL 7.2  9.2  10.1   Hemoglobin 12.0 - 15.0 g/dL 13.0  13.5  13.0   Hematocrit 36.0 - 46.0 % 38.3  41.3  38.4   Platelets 150.0 - 400.0 K/uL  203.0  264  270     Lab Results  Component Value Date   HGBA1C 5.8 04/01/2022   Lab Results  Component Value Date   TSH 0.54 08/30/2022    ================================================== I spent a total of 16 minutes with the patient spent in direct patient consultation.  Additional time spent with chart review  / charting (studies, outside notes, etc): 15 min Total Time: 31 min  Current medicines are reviewed at length with the patient today.  (+/- concerns) n/a  Notice: This dictation was prepared with Dragon dictation along with smart phrase technology. Any transcriptional errors that result from this process are unintentional and may not be corrected upon review.  Studies Ordered:   Orders Placed This Encounter  Procedures   EKG 12-Lead   No orders of the defined types were placed in this encounter.   Patient Instructions / Medication Changes & Studies & Tests Ordered   Patient Instructions  Medication Instructions:  Your physician recommends that you continue on your current medications as directed. Please refer to the Current Medication list given to you today.  *If you need a refill on your cardiac medications before your next appointment, please call your pharmacy*   Lab Work: No labs ordered  If you have labs (blood work) drawn today and your tests are completely normal, you will  receive your results only by: [place] (if you have MyChart) OR A paper copy in the mail If you have any lab test that is abnormal or we need to change your treatment, we will call you to review the results.   Testing/Procedures: No testing ordered  Follow-Up: At [facility], you and your health needs are our priority.  As part of our continuing mission to provide you with exceptional heart care, we have created designated Provider Care Teams.  These Care Teams include your primary Cardiologist (physician) and Advanced Practice Providers (APPs -  Physician  Assistants and Nurse Practitioners) who all work together to provide you with the care you need, when you need it.  We recommend signing up for the patient portal called "MyChart".  Sign up information is provided on this After Visit Summary.  MyChart is used to connect with patients for Virtual Visits (Telemedicine).  Patients are able to view lab/test results, encounter notes, upcoming appointments, etc.  Non-urgent messages can be sent to your provider as well.   To learn more about what you can do with MyChart, go to [email or web address].    Your next appointment:   As needed  Provider:   You may see Deanna Hew, MD or one of the following Advanced Practice Providers on your designated Care Team:   Murray Hodgkins, NP Christell Faith, PA-C Cadence Kathlen Mody, PA-C Gerrie Nordmann, NP      Leonie Man, MD, MS Deanna Ross, M.D., M.S. Interventional Cardiologist  [place]  Pager # [phone] Phone # [phone] [address] [place], [place] [address]   Thank you for choosing [facility]!!

## 2022-10-17 ENCOUNTER — Encounter: Payer: Self-pay | Admitting: Cardiology

## 2022-10-17 ENCOUNTER — Ambulatory Visit: Payer: Medicare Other | Attending: Cardiology | Admitting: Cardiology

## 2022-10-17 VITALS — BP 134/79 | HR 66 | Ht 65.0 in | Wt 144.6 lb

## 2022-10-17 DIAGNOSIS — R55 Syncope and collapse: Secondary | ICD-10-CM

## 2022-10-17 DIAGNOSIS — I10 Essential (primary) hypertension: Secondary | ICD-10-CM | POA: Diagnosis not present

## 2022-10-17 DIAGNOSIS — R42 Dizziness and giddiness: Secondary | ICD-10-CM | POA: Diagnosis not present

## 2022-10-17 NOTE — Patient Instructions (Signed)
Medication Instructions:  Your physician recommends that you continue on your current medications as directed. Please refer to the Current Medication list given to you today.  *If you need a refill on your cardiac medications before your next appointment, please call your pharmacy*   Lab Work: No labs ordered  If you have labs (blood work) drawn today and your tests are completely normal, you will receive your results only by: [place] (if you have MyChart) OR A paper copy in the mail If you have any lab test that is abnormal or we need to change your treatment, we will call you to review the results.   Testing/Procedures: No testing ordered  Follow-Up: At [facility], you and your health needs are our priority.  As part of our continuing mission to provide you with exceptional heart care, we have created designated Provider Care Teams.  These Care Teams include your primary Cardiologist (physician) and Advanced Practice Providers (APPs -  Physician Assistants and Nurse Practitioners) who all work together to provide you with the care you need, when you need it.  We recommend signing up for the patient portal called "MyChart".  Sign up information is provided on this After Visit Summary.  MyChart is used to connect with patients for Virtual Visits (Telemedicine).  Patients are able to view lab/test results, encounter notes, upcoming appointments, etc.  Non-urgent messages can be sent to your provider as well.   To learn more about what you can do with MyChart, go to [email or web address].    Your next appointment:   As needed  Provider:   You may see Glenetta Hew, MD or one of the following Advanced Practice Providers on your designated Care Team:   Murray Hodgkins, NP Christell Faith, PA-C Cadence Kathlen Mody, PA-C Gerrie Nordmann, NP

## 2022-10-26 ENCOUNTER — Encounter: Payer: Self-pay | Admitting: Cardiology

## 2022-10-26 NOTE — Assessment & Plan Note (Signed)
Overall, doing much better.  We will See how she does now that she is no longer having the possibility of being caregiver for her mother-in-law.  I instructed her that she needs to take care of herself with adequate hydration and adequate nutrition.  She needs increased level of activity and exercise to help mobilize her blood flow.  Provide if she is not having active symptoms, I think we can hold off on further treatment options.  But I do agree with lower dose of HCTZ to avoid orthostasis.

## 2022-11-14 ENCOUNTER — Ambulatory Visit: Payer: Medicare Other | Admitting: Dermatology

## 2022-11-14 VITALS — BP 122/73

## 2022-11-14 DIAGNOSIS — L821 Other seborrheic keratosis: Secondary | ICD-10-CM

## 2022-11-14 DIAGNOSIS — L918 Other hypertrophic disorders of the skin: Secondary | ICD-10-CM

## 2022-11-14 DIAGNOSIS — L578 Other skin changes due to chronic exposure to nonionizing radiation: Secondary | ICD-10-CM | POA: Diagnosis not present

## 2022-11-14 DIAGNOSIS — L820 Inflamed seborrheic keratosis: Secondary | ICD-10-CM

## 2022-11-14 DIAGNOSIS — L814 Other melanin hyperpigmentation: Secondary | ICD-10-CM

## 2022-11-14 NOTE — Patient Instructions (Signed)
Cryotherapy Aftercare  Wash gently with soap and water everyday.   Apply Vaseline and Band-Aid daily until healed.     Due to recent changes in healthcare laws, you may see results of your pathology and/or laboratory studies on MyChart before the doctors have had a chance to review them. We understand that in some cases there may be results that are confusing or concerning to you. Please understand that not all results are received at the same time and often the doctors may need to interpret multiple results in order to provide you with the best plan of care or course of treatment. Therefore, we ask that you please give us 2 business days to thoroughly review all your results before contacting the office for clarification. Should we see a critical lab result, you will be contacted sooner.   If You Need Anything After Your Visit  If you have any questions or concerns for your doctor, please call our main line at [phone] and press option 4 to reach your doctor's medical assistant. If no one answers, please leave a voicemail as directed and we will return your call as soon as possible. Messages left after 4 pm will be answered the following business day.   You may also send us a message via MyChart. We typically respond to MyChart messages within 1-2 business days.  For prescription refills, please ask your pharmacy to contact our office. Our fax number is [phone].  If you have an urgent issue when the clinic is closed that cannot wait until the next business day, you can page your doctor at the number below.    Please note that while we do our best to be available for urgent issues outside of office hours, we are not available 24/7.   If you have an urgent issue and are unable to reach us, you may choose to seek medical care at your doctor's office, retail clinic, urgent care center, or emergency room.  If you have a medical emergency, please immediately call 911 or go to the  emergency department.  Pager Numbers  - Dr. Kowalski: [phone]  - Dr. Moye: [phone]  - Dr. Stewart: [phone]  In the event of inclement weather, please call our main line at [phone] for an update on the status of any delays or closures.  Dermatology Medication Tips: Please keep the boxes that topical medications come in in order to help keep track of the instructions about where and how to use these. Pharmacies typically print the medication instructions only on the boxes and not directly on the medication tubes.   If your medication is too expensive, please contact our office at [phone] option 4 or send us a message through MyChart.   We are unable to tell what your co-pay for medications will be in advance as this is different depending on your insurance coverage. However, we may be able to find a substitute medication at lower cost or fill out paperwork to get insurance to cover a needed medication.   If a prior authorization is required to get your medication covered by your insurance company, please allow us 1-2 business days to complete this process.  Drug prices often vary depending on where the prescription is filled and some pharmacies may offer cheaper prices.  The website www.goodrx.com contains coupons for medications through different pharmacies. The prices here do not account for what the cost may be with help from insurance (it may be cheaper with your insurance), but the website can   give you the price if you did not use any insurance.  - You can print the associated coupon and take it with your prescription to the pharmacy.  - You may also stop by our office during regular business hours and pick up a GoodRx coupon card.  - If you need your prescription sent electronically to a different pharmacy, notify our office through [facility] MyChart or by phone at [phone] option 4.     Si Usted Necesita Algo Despus de Su Visita  Tambin puede  enviarnos un mensaje a travs de MyChart. Por lo general respondemos a los mensajes de MyChart en el transcurso de 1 a 2 das hbiles.  Para renovar recetas, por favor pida a su farmacia que se ponga en contacto con nuestra oficina. Nuestro nmero de fax es el [phone].  Si tiene un asunto urgente cuando la clnica est cerrada y que no puede esperar hasta el siguiente da hbil, puede llamar/localizar a su doctor(a) al nmero que aparece a continuacin.   Por favor, tenga en cuenta que aunque hacemos todo lo posible para estar disponibles para asuntos urgentes fuera del horario de oficina, no estamos disponibles las 24 horas del da, los 7 das de la semana.   Si tiene un problema urgente y no puede comunicarse con nosotros, puede optar por buscar atencin mdica  en el consultorio de su doctor(a), en una clnica privada, en un centro de atencin urgente o en una sala de emergencias.  Si tiene una emergencia mdica, por favor llame inmediatamente al 911 o vaya a la sala de emergencias.  Nmeros de bper  - Dr. Kowalski: [phone]  - Dra. Moye: [phone]  - Dra. Stewart: [phone]  En caso de inclemencias del tiempo, por favor llame a nuestra lnea principal al [phone] para una actualizacin sobre el estado de cualquier retraso o cierre.  Consejos para la medicacin en dermatologa: Por favor, guarde las cajas en las que vienen los medicamentos de uso tpico para ayudarle a seguir las instrucciones sobre dnde y cmo usarlos. Las farmacias generalmente imprimen las instrucciones del medicamento slo en las cajas y no directamente en los tubos del medicamento.   Si su medicamento es muy caro, por favor, pngase en contacto con nuestra oficina llamando al [phone] y presione la opcin 4 o envenos un mensaje a travs de MyChart.   No podemos decirle cul ser su copago por los medicamentos por adelantado ya que esto es diferente dependiendo de la cobertura de su seguro.  Sin embargo, es posible que podamos encontrar un medicamento sustituto a menor costo o llenar un formulario para que el seguro cubra el medicamento que se considera necesario.   Si se requiere una autorizacin previa para que su compaa de seguros cubra su medicamento, por favor permtanos de 1 a 2 das hbiles para completar este proceso.  Los precios de los medicamentos varan con frecuencia dependiendo del lugar de dnde se surte la receta y alguna farmacias pueden ofrecer precios ms baratos.  El sitio web www.goodrx.com tiene cupones para medicamentos de diferentes farmacias. Los precios aqu no tienen en cuenta lo que podra costar con la ayuda del seguro (puede ser ms barato con su seguro), pero el sitio web puede darle el precio si no utiliz ningn seguro.  - Puede imprimir el cupn correspondiente y llevarlo con su receta a la farmacia.  - Tambin puede pasar por nuestra oficina durante el horario de atencin regular y recoger una tarjeta de cupones de GoodRx.  -   Si necesita que su receta se enve electrnicamente a una farmacia diferente, informe a nuestra oficina a travs de MyChart de [facility] o por telfono llamando al [phone] y presione la opcin 4.  

## 2022-11-14 NOTE — Progress Notes (Signed)
   New Patient Visit  Subjective  Deanna Ross is a 72 y.o. female who presents for the following: Other (Spot of left shoulder that are rubbed by her swim suit). The patient has spots, moles and lesions to be evaluated, some may be new or changing and the patient has concerns that these could be cancer.  The following portions of the chart were reviewed this encounter and updated as appropriate:   Tobacco  Allergies  Meds  Problems  Med Hx  Surg Hx  Fam Hx     Review of Systems:  No other skin or systemic complaints except as noted in HPI or Assessment and Plan.  Objective  Well appearing patient in no apparent distress; mood and affect are within normal limits.  A focused examination was performed including left shoulder, back. Relevant physical exam findings are noted in the Assessment and Plan.  Left Shoulder - Anterior (2) Erythematous stuck-on, waxy papule or plaque  Left Shoulder - Anterior (2) Fleshy, skin-colored pedunculated papules.     Assessment & Plan   Actinic Damage - chronic, secondary to cumulative UV radiation exposure/sun exposure over time - diffuse scaly erythematous macules with underlying dyspigmentation - Recommend daily broad spectrum sunscreen SPF 30+ to sun-exposed areas, reapply every 2 hours as needed.  - Recommend staying in the shade or wearing long sleeves, sun glasses (UVA+UVB protection) and wide brim hats (4-inch brim around the entire circumference of the hat). - Call for new or changing lesions.  Lentigines - Scattered tan macules - Due to sun exposure - Benign-appearing, observe - Recommend daily broad spectrum sunscreen SPF 30+ to sun-exposed areas, reapply every 2 hours as needed. - Call for any changes  Seborrheic Keratoses - Stuck-on, waxy, tan-brown papules and/or plaques  - Benign-appearing - Discussed benign etiology and prognosis. - Observe - Call for any changes  Inflamed seborrheic keratosis (2) Left Shoulder  - Anterior  Destruction of lesion - Left Shoulder - Anterior Complexity: simple   Destruction method: cryotherapy   Informed consent: discussed and consent obtained   Timeout:  patient name, date of birth, surgical site, and procedure verified Lesion destroyed using liquid nitrogen: Yes   Region frozen until ice ball extended beyond lesion: Yes   Outcome: patient tolerated procedure well with no complications   Post-procedure details: wound care instructions given    Inflamed skin tag (2) Left Shoulder - Anterior  Destruction of lesion - Left Shoulder - Anterior Complexity: simple   Destruction method: cryotherapy   Informed consent: discussed and consent obtained   Timeout:  patient name, date of birth, surgical site, and procedure verified Lesion destroyed using liquid nitrogen: Yes   Region frozen until ice ball extended beyond lesion: Yes   Outcome: patient tolerated procedure well with no complications   Post-procedure details: wound care instructions given     Return if symptoms worsen or fail to improve.  I, Ashok Cordia, CMA, am acting as scribe for Sarina Ser, MD . Documentation: I have reviewed the above documentation for accuracy and completeness, and I agree with the above.  Sarina Ser, MD

## 2022-11-21 ENCOUNTER — Other Ambulatory Visit: Payer: Self-pay | Admitting: Family Medicine

## 2022-11-21 DIAGNOSIS — I10 Essential (primary) hypertension: Secondary | ICD-10-CM

## 2022-11-22 ENCOUNTER — Encounter: Payer: Self-pay | Admitting: Dermatology

## 2023-02-23 ENCOUNTER — Other Ambulatory Visit: Payer: Self-pay | Admitting: Family Medicine

## 2023-02-23 DIAGNOSIS — K219 Gastro-esophageal reflux disease without esophagitis: Secondary | ICD-10-CM

## 2023-04-02 ENCOUNTER — Encounter: Payer: Self-pay | Admitting: Family Medicine

## 2023-04-02 ENCOUNTER — Ambulatory Visit (INDEPENDENT_AMBULATORY_CARE_PROVIDER_SITE_OTHER): Payer: Medicare Other | Admitting: Family Medicine

## 2023-04-02 VITALS — BP 118/68 | HR 68 | Temp 97.7°F | Ht 65.0 in | Wt 145.2 lb

## 2023-04-02 DIAGNOSIS — I10 Essential (primary) hypertension: Secondary | ICD-10-CM | POA: Diagnosis not present

## 2023-04-02 DIAGNOSIS — R7303 Prediabetes: Secondary | ICD-10-CM

## 2023-04-02 DIAGNOSIS — Z1501 Genetic susceptibility to malignant neoplasm of breast: Secondary | ICD-10-CM

## 2023-04-02 DIAGNOSIS — R918 Other nonspecific abnormal finding of lung field: Secondary | ICD-10-CM

## 2023-04-02 DIAGNOSIS — Z1231 Encounter for screening mammogram for malignant neoplasm of breast: Secondary | ICD-10-CM

## 2023-04-02 DIAGNOSIS — Z1507 Genetic susceptibility to malignant neoplasm of urinary tract: Secondary | ICD-10-CM

## 2023-04-02 DIAGNOSIS — Z1509 Genetic susceptibility to other malignant neoplasm: Secondary | ICD-10-CM

## 2023-04-02 DIAGNOSIS — E785 Hyperlipidemia, unspecified: Secondary | ICD-10-CM | POA: Diagnosis not present

## 2023-04-02 DIAGNOSIS — Z1211 Encounter for screening for malignant neoplasm of colon: Secondary | ICD-10-CM | POA: Diagnosis not present

## 2023-04-02 LAB — COMPREHENSIVE METABOLIC PANEL
ALT: 21 U/L (ref 0–35)
AST: 19 U/L (ref 0–37)
Albumin: 4.4 g/dL (ref 3.5–5.2)
Alkaline Phosphatase: 70 U/L (ref 39–117)
BUN: 17 mg/dL (ref 6–23)
CO2: 30 mEq/L (ref 19–32)
Calcium: 9.9 mg/dL (ref 8.4–10.5)
Chloride: 97 mEq/L (ref 96–112)
Creatinine, Ser: 0.66 mg/dL (ref 0.40–1.20)
GFR: 87.06 mL/min (ref >60.00)
Glucose, Bld: 102 mg/dL — ABNORMAL HIGH (ref 70–99)
Potassium: 3.8 mEq/L (ref 3.5–5.1)
Sodium: 135 mEq/L (ref 135–145)
Total Bilirubin: 0.5 mg/dL (ref 0.2–1.2)
Total Protein: 7 g/dL (ref 6.0–8.3)

## 2023-04-02 LAB — LIPID PANEL
Cholesterol: 150 mg/dL (ref 0–200)
HDL: 54.5 mg/dL (ref >39.00)
LDL Cholesterol: 63 mg/dL (ref 0–99)
NonHDL: 95.28
Total CHOL/HDL Ratio: 3
Triglycerides: 159 mg/dL — ABNORMAL HIGH (ref 0.0–149.0)
VLDL: 31.8 mg/dL (ref 0.0–40.0)

## 2023-04-02 LAB — HEMOGLOBIN A1C: Hgb A1c MFr Bld: 5.6 % (ref 4.6–6.5)

## 2023-04-02 NOTE — Patient Instructions (Signed)
Nice to see you. We will contact you with the lab results. Somebody should contact you to schedule your CT scan for November of this year. Please call [phone] to schedule your mammogram.

## 2023-04-02 NOTE — Assessment & Plan Note (Signed)
Chronic issue.  Check lipid panel.  Continue Lipitor 20 mg daily. 

## 2023-04-02 NOTE — Assessment & Plan Note (Signed)
Patient is due for mammogram.  Patient will call to get this scheduled.  In the past genetic counselor Christian Mate MD) felt MRIs would not provide any additional benefit.

## 2023-04-02 NOTE — Assessment & Plan Note (Signed)
Plan for repeat chest CT in November 2024.  Order placed.

## 2023-04-02 NOTE — Progress Notes (Signed)
Marikay Alar, MD Phone: [phone]  Deanna Ross is a 72 y.o. female who presents today for f/u/  HYPERTENSION Disease Monitoring Home BP Monitoring 120s/60s Chest pain- no    Dyspnea- no Medications Compliance-  taking hydrochlorothiazide, lisinopril.   Edema- no BMET    Component Value Date/Time   NA 135 08/30/2022 1235   NA 140 07/23/2022 1032   K 3.6 08/30/2022 1235   CL 99 08/30/2022 1235   CO2 31 08/30/2022 1235   GLUCOSE 96 08/30/2022 1235   BUN 15 08/30/2022 1235   BUN 10 07/23/2022 1032   CREATININE 0.62 08/30/2022 1235   CALCIUM 9.3 08/30/2022 1235   GFRNONAA >60 05/12/2022 1220   HYPERLIPIDEMIA Symptoms Chest pain on exertion:  no  Medications: Compliance- taking lipitor Right upper quadrant pain- no  Muscle aches- no Lipid Panel     Component Value Date/Time   CHOL 141 04/01/2022 0925   TRIG 82.0 04/01/2022 0925   HDL 57.30 04/01/2022 0925   CHOLHDL 2 04/01/2022 0925   VLDL 16.4 04/01/2022 0925   LDLCALC 67 04/01/2022 0925   LDLDIRECT 75.0 08/24/2018 1020   Patient has been going to the pool 4-5 times a week and walking 3 times a week for exercise.   Social History   Tobacco Use  Smoking Status Never  Smokeless Tobacco Never    Current Outpatient Medications on File Prior to Visit  Medication Sig Dispense Refill   atorvastatin (LIPITOR) 20 MG tablet TAKE 1 TABLET BY MOUTH ONCE  DAILY 100 tablet 2   esomeprazole (NEXIUM) 20 MG capsule TAKE 1 CAPSULE BY MOUTH 3 TIMES  WEEKLY  BEFORE BREAKFAST . 43 capsule 2   hydrochlorothiazide (HYDRODIURIL) 25 MG tablet TAKE 1 TABLET BY MOUTH DAILY 100 tablet 2   lisinopril (ZESTRIL) 20 MG tablet TAKE 1 TABLET BY MOUTH DAILY 100 tablet 2   Multiple Vitamins-Minerals (MULTIVITAMIN ADULT PO) Take by mouth.     RESTASIS 0.05 % ophthalmic emulsion 1 drop 2 (two) times daily.     No current facility-administered medications on file prior to visit.     ROS see history of present  illness  Objective  Physical Exam Vitals:   04/02/23 0806  BP: 118/68  Pulse: 68  Temp: 97.7 F (36.5 C)  SpO2: 98%    BP Readings from Last 3 Encounters:  04/02/23 118/68  11/14/22 122/73  10/17/22 134/79   Wt Readings from Last 3 Encounters:  04/02/23 145 lb 3.2 oz (65.9 kg)  10/17/22 144 lb 9.6 oz (65.6 kg)  10/02/22 144 lb (65.3 kg)    Physical Exam Constitutional:      General: She is not in acute distress.    Appearance: She is not diaphoretic.  Cardiovascular:     Rate and Rhythm: Normal rate and regular rhythm.     Heart sounds: Normal heart sounds.  Pulmonary:     Effort: Pulmonary effort is normal.     Breath sounds: Normal breath sounds.  Chest:       Comments: Scar noted as above, otherwise no skin changes, masses, nipple inversion, or tenderness in either breast, no axillary masses bilaterally Musculoskeletal:     Right lower leg: No edema.     Left lower leg: No edema.  Skin:    General: Skin is warm and dry.  Neurological:     Mental Status: She is alert.      Assessment/Plan: Please see individual problem list.  Essential hypertension Assessment & Plan: Chronic issue.  Well-controlled.  Patient will continue HCTZ 12.5 mg daily and lisinopril 20 mg daily.   Prediabetes Assessment & Plan: Chronic issue.  Check A1c.  Continue exercise changes.  Orders: -     Hemoglobin A1c  Lung nodules Assessment & Plan: Plan for repeat chest CT in November 2024.  Order placed.  Orders: -     CT CHEST WO CONTRAST; Future  Hyperlipidemia, unspecified hyperlipidemia type Assessment & Plan: Chronic issue.  Check lipid panel.  Continue Lipitor 20 mg daily.  Orders: -     Comprehensive metabolic panel -     Lipid panel  BRCA2 positive Assessment & Plan: Patient is due for mammogram.  Patient will call to get this scheduled.  In the past genetic counselor Christian Mate MD) felt MRIs would not provide any additional benefit.   Encounter for  screening mammogram for malignant neoplasm of breast -     3D Screening Mammogram, Left and Right; Future  Colon cancer screening -     Cologuard; Future     Return in about 6 months (around 10/03/2023) for Hypertension.   Marikay Alar, MD [facility]

## 2023-04-02 NOTE — Assessment & Plan Note (Addendum)
Chronic issue.  Well-controlled.  Patient will continue HCTZ 12.5 mg daily and lisinopril 20 mg daily.

## 2023-04-02 NOTE — Assessment & Plan Note (Signed)
Chronic issue.  Check A1c.  Continue exercise changes.

## 2023-05-13 DIAGNOSIS — I10 Essential (primary) hypertension: Secondary | ICD-10-CM | POA: Diagnosis not present

## 2023-05-14 ENCOUNTER — Emergency Department: Payer: Medicare Other

## 2023-05-14 ENCOUNTER — Emergency Department
Admission: EM | Admit: 2023-05-14 | Discharge: 2023-05-14 | Disposition: A | Discharge status: Home / Self Care | Payer: Medicare Other | Attending: Emergency Medicine | Admitting: Emergency Medicine

## 2023-05-14 ENCOUNTER — Other Ambulatory Visit: Payer: Self-pay

## 2023-05-14 ENCOUNTER — Telehealth: Payer: Self-pay | Admitting: Family Medicine

## 2023-05-14 DIAGNOSIS — Z79899 Other long term (current) drug therapy: Secondary | ICD-10-CM | POA: Insufficient documentation

## 2023-05-14 DIAGNOSIS — I10 Essential (primary) hypertension: Secondary | ICD-10-CM | POA: Insufficient documentation

## 2023-05-14 DIAGNOSIS — R519 Headache, unspecified: Secondary | ICD-10-CM | POA: Diagnosis not present

## 2023-05-14 LAB — CBC
HCT: 41.3 % (ref 36.0–46.0)
Hemoglobin: 13.3 g/dL (ref 12.0–15.0)
MCH: 30 pg (ref 26.0–34.0)
MCHC: 32.2 g/dL (ref 30.0–36.0)
MCV: 93.2 fL (ref 80.0–100.0)
Platelets: 219 10*3/uL (ref 150–400)
RBC: 4.43 MIL/uL (ref 3.87–5.11)
RDW: 12.7 % (ref 11.5–15.5)
WBC: 6.4 10*3/uL (ref 4.0–10.5)
nRBC: 0 % (ref 0.0–0.2)

## 2023-05-14 LAB — COMPREHENSIVE METABOLIC PANEL
ALT: 20 U/L (ref 0–44)
AST: 18 U/L (ref 15–41)
Albumin: 4 g/dL (ref 3.5–5.0)
Alkaline Phosphatase: 68 U/L (ref 38–126)
Anion gap: 12 (ref 5–15)
BUN: 10 mg/dL (ref 8–23)
CO2: 25 mmol/L (ref 22–32)
Calcium: 9 mg/dL (ref 8.9–10.3)
Chloride: 101 mmol/L (ref 98–111)
Creatinine, Ser: 0.55 mg/dL (ref 0.44–1.00)
GFR, Estimated: >60 mL/min (ref >60)
Glucose, Bld: 98 mg/dL (ref 70–99)
Potassium: 3.2 mmol/L — ABNORMAL LOW (ref 3.5–5.1)
Sodium: 138 mmol/L (ref 135–145)
Total Bilirubin: 1 mg/dL (ref 0.3–1.2)
Total Protein: 7.1 g/dL (ref 6.5–8.1)

## 2023-05-14 MED ORDER — CLONIDINE HCL 0.1 MG PO TABS
0.1000 mg | ORAL_TABLET | Freq: Once | ORAL | Status: AC
  Administered 2023-05-14: 0.1 mg via ORAL
  Filled 2023-05-14: qty 1

## 2023-05-14 MED ORDER — ACETAMINOPHEN 500 MG PO TABS
1000.0000 mg | ORAL_TABLET | Freq: Once | ORAL | Status: AC
  Administered 2023-05-14: 1000 mg via ORAL
  Filled 2023-05-14: qty 2

## 2023-05-14 NOTE — Telephone Encounter (Signed)
Pt called in stating she is having issues with her BP and a headache since yesterday Pt stated when she went to the urgent care her BP was 211/95  sent to access nurse

## 2023-05-14 NOTE — ED Notes (Signed)
See triage notes. Patient began having high blood pressure runs yesterday. Patient called her PCP and was told to come here.

## 2023-05-14 NOTE — Telephone Encounter (Signed)
Patient is at the ED now.

## 2023-05-14 NOTE — Discharge Instructions (Addendum)
As we discussed please increase his lisinopril from 10 mg to 20 mg.  Please follow-up with your doctor in approximately 1 week for recheck/reevaluation.  Please check your blood pressure every several days.  If the top number drops below 120 please switch back to 10 mg lisinopril dosing.  Return to the emergency department for any symptom personally concerning to yourself.

## 2023-05-14 NOTE — ED Provider Notes (Signed)
[facility] Provider Note    Event Date/Time   First MD Initiated Contact with Patient 05/14/23 1021     (approximate)  History   Chief Complaint: Hypertension and Headache  HPI  Dova Bacote is a 72 y.o. female with a past medical history of hypertension, hyperlipidemia, presents to the emergency department for high blood pressure.  According to the patient for the last 2 to 3 days she has been experiencing a headache.  She took her blood pressure yesterday and it was elevated up to 200.  Patient states she went to urgent care and was found to be greater than 200.  Patient states she was told to take an extra lisinopril which she did, checked her blood pressure again this morning it was still elevated so she came to the emergency department for evaluation.  Patient takes lisinopril and hydrochlorothiazide for her hypertension at baseline.  Denies missing any doses.  Physical Exam   Triage Vital Signs: ED Triage Vitals [05/14/23 0900]  Encounter Vitals Group     BP (!) 182/88     Systolic BP Percentile      Diastolic BP Percentile      Pulse Rate 65     Resp 18     Temp 98 F (36.7 C)     Temp src      SpO2 98 %     Weight      Height      Head Circumference      Peak Flow      Pain Score 3     Pain Loc      Pain Education      Exclude from Growth Chart     Most recent vital signs: Vitals:   05/14/23 0900  BP: (!) 182/88  Pulse: 65  Resp: 18  Temp: 98 F (36.7 C)  SpO2: 98%    General: Awake, no distress.  CV:  Good peripheral perfusion.  Regular rate and rhythm  Resp:  Normal effort.  Equal breath sounds bilaterally.  Abd:  No distention.   Other:  Equal grip strength bilaterally.  No cranial nerve deficits.  No pronator drift.   ED Results / Procedures / Treatments   RADIOLOGY  I have reviewed and interpreted CT images.  No significant abnormality seen on my evaluation.  No bleed.   MEDICATIONS ORDERED IN  ED: Medications  cloNIDine (CATAPRES) tablet 0.1 mg (0.1 mg Oral Given 05/14/23 1056)  acetaminophen (TYLENOL) tablet 1,000 mg (1,000 mg Oral Given 05/14/23 1056)     IMPRESSION / MDM / ASSESSMENT AND PLAN / ED COURSE  I reviewed the triage vital signs and the nursing notes.  Patient's presentation is most consistent with acute presentation with potential threat to life or bodily function.  Patient presents the emergency department for headache and hypertension.  Patient currently 182/88 in the emergency department otherwise reassuring vital signs.  Reassuring physical exam including neurological exam.  Patient took her normal blood pressure medications this morning.  We will dose an additional 0.1 mg of clonidine orally as well as treat the headache with Tylenol as the patient has not taken any over-the-counter medications for her headache.  We will check basic labs as well as a CT scan of the head given no significant history of headaches per patient and we will continue to closely monitor in the emergency department.  I have reviewed the CT images of the head I do not see any significant findings.  Patient CBC and chemistry are largely within normal limits.  Will reassess after medications.  CT scans negative for acute abnormality, CBC is normal, chemistry reassuring.  Recheck of patient's blood pressure is now currently 130/71 during my evaluation.  Patient states she feels much better no longer has a headache after Tylenol.  Discussed with the patient to continue to use Tylenol if needed for headache/discomfort otherwise she will follow-up with her doctor to discuss her blood pressure.  Discussed with the patient she could fairly safely increase her lisinopril from 10 mg to 20 mg daily however if her blood pressure drops below 120 on the top number she should discontinue the increased dose of lisinopril and return to her 10 mg dose.  Patient agreeable to this plan.  FINAL CLINICAL IMPRESSION(S)  / ED DIAGNOSES   Headache Hypertension    Note:  This document was prepared using Dragon voice recognition software and may include unintentional dictation errors.   Minna Antis, MD 05/14/23 1247

## 2023-05-14 NOTE — Telephone Encounter (Signed)
Noted  

## 2023-05-14 NOTE — ED Triage Notes (Signed)
Pt to ED via POV from home. Pt ambulatory to triage. Pt reports she went to UC last night due to her BP being in the 200 systolic. Pt reports being med compliant with BP meds. Pt endorses HA. Pt BP in triage 182/88

## 2023-05-16 ENCOUNTER — Encounter: Payer: Self-pay | Admitting: Family Medicine

## 2023-05-16 ENCOUNTER — Ambulatory Visit: Payer: Medicare Other | Admitting: Family Medicine

## 2023-05-16 VITALS — BP 148/82 | HR 68 | Temp 97.7°F | Ht 65.0 in | Wt 144.6 lb

## 2023-05-16 DIAGNOSIS — I10 Essential (primary) hypertension: Secondary | ICD-10-CM

## 2023-05-16 DIAGNOSIS — R8290 Unspecified abnormal findings in urine: Secondary | ICD-10-CM | POA: Diagnosis not present

## 2023-05-16 LAB — POCT URINALYSIS DIPSTICK
Bilirubin, UA: NEGATIVE
Glucose, UA: NEGATIVE
Ketones, UA: NEGATIVE
Nitrite, UA: NEGATIVE
Protein, UA: NEGATIVE
Spec Grav, UA: 1.01 (ref 1.010–1.025)
Urobilinogen, UA: 0.2 E.U./dL
pH, UA: 6.5 (ref 5.0–8.0)

## 2023-05-16 LAB — BASIC METABOLIC PANEL
BUN: 15 mg/dL (ref 6–23)
CO2: 31 mEq/L (ref 19–32)
Calcium: 9.6 mg/dL (ref 8.4–10.5)
Chloride: 100 mEq/L (ref 96–112)
Creatinine, Ser: 0.66 mg/dL (ref 0.40–1.20)
GFR: 86.99 mL/min (ref >60.00)
Glucose, Bld: 89 mg/dL (ref 70–99)
Potassium: 3.7 mEq/L (ref 3.5–5.1)
Sodium: 139 mEq/L (ref 135–145)

## 2023-05-16 LAB — URINALYSIS, MICROSCOPIC ONLY

## 2023-05-16 LAB — TSH: TSH: 0.47 u[IU]/mL (ref 0.35–5.50)

## 2023-05-16 MED ORDER — LISINOPRIL 40 MG PO TABS
40.0000 mg | ORAL_TABLET | Freq: Every day | ORAL | 3 refills | Status: AC
Start: 2023-05-16 — End: ?

## 2023-05-16 NOTE — Assessment & Plan Note (Signed)
Chronic issue.  Uncontrolled with exacerbation.  Patient will continue HCTZ 25 mg daily.  Will increase lisinopril to 40 mg daily.  Check lab work today as outlined.  Follow-up in 2 weeks.

## 2023-05-16 NOTE — Patient Instructions (Signed)
Nice to see you. You can take two of your lisinopril 20 mg tablets daily to equal 40 mg daily until you run out.  Your next prescription will be for 40 mg tablets and you will only take 1 of those daily.  We will contact you with your lab results.  I will see you back in 2 weeks.

## 2023-05-16 NOTE — Progress Notes (Signed)
  Marikay Alar, MD Phone: [phone]  Deanna Ross is a 72 y.o. female who presents today for follow-up.  Hypertension: Patient was in the emergency department with elevated blood pressures.  She had blood pressures up to 211/95.  Since discharge her blood pressures have been in the 160s-170s/80s.  She is taking lisinopril 20 mg daily and HCTZ 25 mg daily.  No chest pain, shortness of breath, or edema.  She does have mild headache with this though notes it is better at this point.  She did have CT head that was unremarkable for an acute cause.  A CMP and CBC were unremarkable except for a mildly low potassium.  Social History   Tobacco Use  Smoking Status Never  Smokeless Tobacco Never    Current Outpatient Medications on File Prior to Visit  Medication Sig Dispense Refill   atorvastatin (LIPITOR) 20 MG tablet TAKE 1 TABLET BY MOUTH ONCE  DAILY 100 tablet 2   esomeprazole (NEXIUM) 20 MG capsule TAKE 1 CAPSULE BY MOUTH 3 TIMES  WEEKLY  BEFORE BREAKFAST . 43 capsule 2   hydrochlorothiazide (HYDRODIURIL) 25 MG tablet TAKE 1 TABLET BY MOUTH DAILY 100 tablet 2   Multiple Vitamins-Minerals (MULTIVITAMIN ADULT PO) Take by mouth.     RESTASIS 0.05 % ophthalmic emulsion 1 drop 2 (two) times daily.     No current facility-administered medications on file prior to visit.     ROS see history of present illness  Objective  Physical Exam Vitals:   05/16/23 1049 05/16/23 1113  BP: (!) 144/86 (!) 148/82  Pulse: 68   Temp: 97.7 F (36.5 C)   SpO2: 98%     BP Readings from Last 3 Encounters:  05/16/23 (!) 148/82  05/14/23 (!) 181/78  04/02/23 118/68   Wt Readings from Last 3 Encounters:  05/16/23 144 lb 9.6 oz (65.6 kg)  04/02/23 145 lb 3.2 oz (65.9 kg)  10/17/22 144 lb 9.6 oz (65.6 kg)    Physical Exam Constitutional:      General: She is not in acute distress.    Appearance: She is not diaphoretic.  Cardiovascular:     Rate and Rhythm: Normal rate and regular rhythm.      Heart sounds: Normal heart sounds.  Pulmonary:     Effort: Pulmonary effort is normal.     Breath sounds: Normal breath sounds.  Musculoskeletal:     Right lower leg: No edema.     Left lower leg: No edema.  Skin:    General: Skin is warm and dry.  Neurological:     Mental Status: She is alert.      Assessment/Plan: Please see individual problem list.  Essential hypertension Assessment & Plan: Chronic issue.  Uncontrolled with exacerbation.  Patient will continue HCTZ 25 mg daily.  Will increase lisinopril to 40 mg daily.  Check lab work today as outlined.  Follow-up in 2 weeks.  Orders: -     Lisinopril; Take 1 tablet (40 mg total) by mouth daily.  Dispense: 90 tablet; Refill: 3 -     Basic metabolic panel -     TSH -     POCT urinalysis dipstick    Return in about 2 weeks (around 05/30/2023).   Marikay Alar, MD [facility]

## 2023-05-23 ENCOUNTER — Telehealth: Payer: Self-pay | Admitting: *Deleted

## 2023-05-23 NOTE — Telephone Encounter (Signed)
Transition Care Management Follow-up Telephone Call Date of discharge and from where: [facility]  05/14/2023 How have you been since you were released from the hospital? Feeling good they have regulated by blood pressure now  Any questions or concerns? No  Items Reviewed: Did the pt receive and understand the discharge instructions provided? Yes  Medications obtained and verified? Yes  Other? No  Any new allergies since your discharge? No  Dietary orders reviewed? No Do you have support at home? Yes    Follow up appointments reviewed:  PCP Hospital f/u appt confirmed? Yes  Already saw PCP  05/19/2023 has transportation and all needed medications   Are transportation arrangements needed? No  If their condition worsens, is the pt aware to call PCP or go to the Emergency Dept.? Yes Was the patient provided with contact information for the PCP's office or ED? Yes Was to pt encouraged to call back with questions or concerns? Yes

## 2023-06-03 ENCOUNTER — Other Ambulatory Visit: Payer: Self-pay

## 2023-06-03 ENCOUNTER — Emergency Department
Admission: EM | Admit: 2023-06-03 | Discharge: 2023-06-04 | Disposition: A | Discharge status: Home / Self Care | Payer: Medicare Other | Attending: Emergency Medicine | Admitting: Emergency Medicine

## 2023-06-03 ENCOUNTER — Encounter: Payer: Self-pay | Admitting: Family Medicine

## 2023-06-03 ENCOUNTER — Emergency Department: Payer: Medicare Other

## 2023-06-03 ENCOUNTER — Ambulatory Visit (INDEPENDENT_AMBULATORY_CARE_PROVIDER_SITE_OTHER): Payer: Medicare Other | Admitting: Family Medicine

## 2023-06-03 ENCOUNTER — Encounter: Payer: Self-pay | Admitting: Emergency Medicine

## 2023-06-03 VITALS — BP 124/76 | HR 76 | Temp 98.2°F | Ht 65.0 in | Wt 142.6 lb

## 2023-06-03 DIAGNOSIS — I10 Essential (primary) hypertension: Secondary | ICD-10-CM | POA: Insufficient documentation

## 2023-06-03 DIAGNOSIS — Z23 Encounter for immunization: Secondary | ICD-10-CM

## 2023-06-03 DIAGNOSIS — R519 Headache, unspecified: Secondary | ICD-10-CM | POA: Diagnosis not present

## 2023-06-03 DIAGNOSIS — R42 Dizziness and giddiness: Secondary | ICD-10-CM | POA: Insufficient documentation

## 2023-06-03 DIAGNOSIS — Z1211 Encounter for screening for malignant neoplasm of colon: Secondary | ICD-10-CM

## 2023-06-03 DIAGNOSIS — R079 Chest pain, unspecified: Secondary | ICD-10-CM | POA: Diagnosis not present

## 2023-06-03 DIAGNOSIS — R55 Syncope and collapse: Secondary | ICD-10-CM | POA: Diagnosis not present

## 2023-06-03 DIAGNOSIS — R404 Transient alteration of awareness: Secondary | ICD-10-CM | POA: Diagnosis not present

## 2023-06-03 DIAGNOSIS — R220 Localized swelling, mass and lump, head: Secondary | ICD-10-CM | POA: Diagnosis not present

## 2023-06-03 DIAGNOSIS — R6889 Other general symptoms and signs: Secondary | ICD-10-CM | POA: Diagnosis not present

## 2023-06-03 DIAGNOSIS — G9389 Other specified disorders of brain: Secondary | ICD-10-CM | POA: Diagnosis not present

## 2023-06-03 DIAGNOSIS — Z743 Need for continuous supervision: Secondary | ICD-10-CM | POA: Diagnosis not present

## 2023-06-03 LAB — BASIC METABOLIC PANEL
Anion gap: 8 (ref 5–15)
BUN: 11 mg/dL (ref 6–23)
BUN: 12 mg/dL (ref 8–23)
CO2: 29 meq/L (ref 19–32)
CO2: 24 mmol/L (ref 22–32)
Calcium: 9.1 mg/dL (ref 8.4–10.5)
Calcium: 8.4 mg/dL — ABNORMAL LOW (ref 8.9–10.3)
Chloride: 96 meq/L (ref 96–112)
Chloride: 100 mmol/L (ref 98–111)
Creatinine, Ser: 0.63 mg/dL (ref 0.40–1.20)
Creatinine, Ser: 0.62 mg/dL (ref 0.44–1.00)
GFR, Estimated: >60 mL/min (ref >60)
GFR: 87.94 mL/min (ref >60.00)
Glucose, Bld: 100 mg/dL — ABNORMAL HIGH (ref 70–99)
Glucose, Bld: 119 mg/dL — ABNORMAL HIGH (ref 70–99)
Potassium: 3.4 meq/L — ABNORMAL LOW (ref 3.5–5.1)
Potassium: 3.2 mmol/L — ABNORMAL LOW (ref 3.5–5.1)
Sodium: 132 meq/L — ABNORMAL LOW (ref 135–145)
Sodium: 132 mmol/L — ABNORMAL LOW (ref 135–145)

## 2023-06-03 LAB — CBC
HCT: 42 % (ref 36.0–46.0)
Hemoglobin: 14 g/dL (ref 12.0–15.0)
MCH: 30.1 pg (ref 26.0–34.0)
MCHC: 33.3 g/dL (ref 30.0–36.0)
MCV: 90.3 fL (ref 80.0–100.0)
Platelets: 234 10*3/uL (ref 150–400)
RBC: 4.65 MIL/uL (ref 3.87–5.11)
RDW: 11.9 % (ref 11.5–15.5)
WBC: 8.6 10*3/uL (ref 4.0–10.5)
nRBC: 0 % (ref 0.0–0.2)

## 2023-06-03 LAB — TROPONIN I (HIGH SENSITIVITY): Troponin I (High Sensitivity): 5 ng/L (ref <18)

## 2023-06-03 MED ORDER — POTASSIUM CHLORIDE CRYS ER 20 MEQ PO TBCR
40.0000 meq | EXTENDED_RELEASE_TABLET | Freq: Once | ORAL | Status: AC
  Administered 2023-06-03: 40 meq via ORAL
  Filled 2023-06-03: qty 2

## 2023-06-03 MED ORDER — BUTALBITAL-APAP-CAFFEINE 50-325-40 MG PO TABS
2.0000 | ORAL_TABLET | Freq: Once | ORAL | Status: AC
  Administered 2023-06-03: 2 via ORAL
  Filled 2023-06-03: qty 2

## 2023-06-03 NOTE — ED Provider Notes (Signed)
[facility] Provider Note    Event Date/Time   First MD Initiated Contact with Patient 06/03/23 2256     (approximate)   History   Near Syncope   HPI  Deanna Ross is a 72 y.o. female who presents to the ED for evaluation of Near Syncope   Review of routine PCP visit from first thing this morning.  History of HTN  Patient presents to the ED for evaluation of a syncopal episode that occurred at church this evening.  Around 8:30 PM she was sitting at a [employer] as a part of the choir.  Towards the end, while singing and standing, she reports developing dizziness, feeling flushed and hot.  She sat down to fan herself and "the next thing I know I am out in the hallway."  Since then, she reports no further spells of dizziness, but does acknowledge a mild and global headache.  She reports that she has passed out while sitting a couple times in the past over the years and "I always have a headache afterwards."   Physical Exam   Triage Vital Signs: ED Triage Vitals  Encounter Vitals Group     BP 06/03/23 2127 (!) 133/53     Systolic BP Percentile --      Diastolic BP Percentile --      Pulse Rate 06/03/23 2127 65     Resp 06/03/23 2127 17     Temp 06/03/23 2127 (!) 97.5 F (36.4 C)     Temp src --      SpO2 06/03/23 2126 98 %     Weight 06/03/23 2129 142 lb (64.4 kg)     Height 06/03/23 2129 5\' 5"  (1.651 m)     Head Circumference --      Peak Flow --      Pain Score 06/03/23 2129 0     Pain Loc --      Pain Education --      Exclude from Growth Chart --     Most recent vital signs: Vitals:   06/04/23 0000 06/04/23 0030  BP: (!) 140/58 (!) 121/49  Pulse: 62 64  Resp: 18 (!) 21  Temp:    SpO2: 100% 100%    General: Awake, no distress.  CV:  Good peripheral perfusion.  Resp:  Normal effort.  Abd:  No distention.  Soft MSK:  No deformity noted.  Palpation of all 4 extremities without evidence of deformity, tenderness or  trauma. Neuro:  No focal deficits appreciated. Cranial nerves II through XII intact 5/5 strength and sensation in all 4 extremities Other:  No signs of head trauma   ED Results / Procedures / Treatments   Labs (all labs ordered are listed, but only abnormal results are displayed) Labs Reviewed  BASIC METABOLIC PANEL - Abnormal; Notable for the following components:      Result Value   Sodium 132 (*)    Potassium 3.2 (*)    Glucose, Bld 119 (*)    Calcium 8.4 (*)    All other components within normal limits  CBC  MAGNESIUM  TROPONIN I (HIGH SENSITIVITY)  TROPONIN I (HIGH SENSITIVITY)    EKG Poor quality EKG.  Tremulous baseline.  Narrow complex rhythm, possibly sinus.  No clear STEMI.  Repeat EKG of a better quality demonstrates a sinus rhythm with a rate of 66 bpm.  Normal axis.  QTc 496.  No STEMI.  RADIOLOGY CXR interpreted by me without evidence of acute  cardiopulmonary pathology.  Official radiology report(s): CT HEAD WO CONTRAST ( )  Result Date: 06/04/2023 CLINICAL DATA:  Syncope. EXAM: CT HEAD WITHOUT CONTRAST TECHNIQUE: Contiguous axial images were obtained from the base of the skull through the vertex without intravenous contrast. RADIATION DOSE REDUCTION: This exam was performed according to the departmental dose-optimization program which includes automated exposure control, adjustment of the mA and/or kV according to patient size and/or use of iterative reconstruction technique. COMPARISON:  May 14, 2023 FINDINGS: Brain: There is mild cerebral atrophy with widening of the extra-axial spaces and ventricular dilatation. There are areas of decreased attenuation within the white matter tracts of the supratentorial brain, consistent with microvascular disease changes. Vascular: No hyperdense vessel or unexpected calcification. Skull: Normal. Negative for fracture or focal lesion. Sinuses/Orbits: No acute finding. Other: There is mild right frontal scalp soft tissue  swelling. IMPRESSION: 1. Mild cerebral atrophy and microvascular disease changes of the supratentorial brain. 2. No acute intracranial abnormality. 3. Mild right frontal scalp soft tissue swelling. Electronically Signed   By: Aram Candela M.D.   On: 06/04/2023 00:07   DG Chest 2 View  Result Date: 06/03/2023 CLINICAL DATA:  Chest pain EXAM: CHEST - 2 VIEW COMPARISON:  None Available. FINDINGS: The heart size and mediastinal contours are within normal limits. Both lungs are clear. The visualized skeletal structures are unremarkable. IMPRESSION: No active cardiopulmonary disease. Electronically Signed   By: Helyn Numbers M.D.   On: 06/03/2023 22:21    PROCEDURES and INTERVENTIONS:  .1-3 Lead EKG Interpretation  Performed by: Delton Prairie, MD Authorized by: Delton Prairie, MD     Interpretation: normal     ECG rate:  70   ECG rate assessment: normal     Rhythm: sinus rhythm     Ectopy: none     Conduction: normal     Medications  potassium chloride SA (KLOR-CON M) CR tablet 40 mEq (40 mEq Oral Given 06/03/23 2339)  butalbital-acetaminophen-caffeine (FIORICET) 50-325-40 MG per tablet 2 tablet (2 tablets Oral Given 06/03/23 2343)     IMPRESSION / MDM / ASSESSMENT AND PLAN / ED COURSE  I reviewed the triage vital signs and the nursing notes.  Differential diagnosis includes, but is not limited to, vasovagal episode, cardiac dysrhythmia, seizure or stroke, dehydration  {Patient presents with symptoms of an acute illness or injury that is potentially life-threatening.  Pleasant 72 year old woman presents to the ED after another episode of syncope.  Looks well now, at baseline without signs of trauma or neurologic deficits.  Due to her headache we will obtain a CT scan of the head.  Screening blood work demonstrates a normal CBC and troponin.  Metabolic panel with mild hypokalemia.  We will replace this orally and add on a magnesium level considering the slight lengthening of her QTc on  EKG.  No other interval changes.  Will keep on telemetry and reassess.  While I considered medical admission, she is quite emphatic that she would like to go home and I think this is reasonable considering how well she looks.  Cardiogenic syncope is a possibility, though less likely considering the overall story and reassuring cardiology workups in the past.  We discussed close outpatient follow-up and return precautions  Clinical Course as of 06/04/23 0343  Wed Jun 04, 2023  0037 Ambulatory, feeling better and requesting discharge [DS]    Clinical Course User Index [DS] Delton Prairie, MD     FINAL CLINICAL IMPRESSION(S) / ED DIAGNOSES   Final diagnoses:  Syncope  and collapse  Vasovagal episode     Rx / DC Orders   ED Discharge Orders     None        Note:  This document was prepared using Dragon voice recognition software and may include unintentional dictation errors.   Delton Prairie, MD 06/04/23 [phone]

## 2023-06-03 NOTE — ED Triage Notes (Signed)
Pt presents to triage via [facility] from [facility] with complaints of syncopal episode. Pt states she was singing and she became lightheaded and diaphoretic. Pt denies falling, hitting her head, nor LOC. Of note, Pt recently had her BP medications adjusted ~1-2 weeks ago. A&Ox4 at this time. Denies CP or SOB.

## 2023-06-03 NOTE — Progress Notes (Signed)
Marikay Alar, MD Phone: [phone]  Deanna Ross is a 72 y.o. female who presents today for f/u.  HYPERTENSION Disease Monitoring Home BP Monitoring lowest it has been was today around 130s/70s, has been up to 150s systolic, has checked on two different cuffs and gotten similar readings Chest pain- no    Dyspnea- no Medications Compliance-  taking lisinopril, HCTZ.   Edema- no BMET    Component Value Date/Time   NA 139 05/16/2023 1124   NA 140 07/23/2022 1032   K 3.7 05/16/2023 1124   CL 100 05/16/2023 1124   CO2 31 05/16/2023 1124   GLUCOSE 89 05/16/2023 1124   BUN 15 05/16/2023 1124   BUN 10 07/23/2022 1032   CREATININE 0.66 05/16/2023 1124   CALCIUM 9.6 05/16/2023 1124   GFRNONAA >60 05/14/2023 0981     Social History   Tobacco Use  Smoking Status Never  Smokeless Tobacco Never    Current Outpatient Medications on File Prior to Visit  Medication Sig Dispense Refill   atorvastatin (LIPITOR) 20 MG tablet TAKE 1 TABLET BY MOUTH ONCE  DAILY 100 tablet 2   esomeprazole (NEXIUM) 20 MG capsule TAKE 1 CAPSULE BY MOUTH 3 TIMES  WEEKLY  BEFORE BREAKFAST . 43 capsule 2   hydrochlorothiazide (HYDRODIURIL) 25 MG tablet TAKE 1 TABLET BY MOUTH DAILY 100 tablet 2   lisinopril (ZESTRIL) 40 MG tablet Take 1 tablet (40 mg total) by mouth daily. 90 tablet 3   Multiple Vitamins-Minerals (MULTIVITAMIN ADULT PO) Take by mouth.     RESTASIS 0.05 % ophthalmic emulsion 1 drop 2 (two) times daily.     No current facility-administered medications on file prior to visit.     ROS see history of present illness  Objective  Physical Exam Vitals:   06/03/23 0756  BP: 124/76  Pulse: 76  Temp: 98.2 F (36.8 C)  SpO2: 99%    BP Readings from Last 3 Encounters:  06/03/23 124/76  05/16/23 (!) 148/82  05/14/23 (!) 181/78   Wt Readings from Last 3 Encounters:  06/03/23 142 lb 9.6 oz (64.7 kg)  05/16/23 144 lb 9.6 oz (65.6 kg)  04/02/23 145 lb 3.2 oz (65.9 kg)     Physical Exam Constitutional:      General: She is not in acute distress.    Appearance: She is not diaphoretic.  Cardiovascular:     Rate and Rhythm: Normal rate and regular rhythm.     Heart sounds: Normal heart sounds.  Pulmonary:     Effort: Pulmonary effort is normal.     Breath sounds: Normal breath sounds.  Musculoskeletal:     Right lower leg: No edema.     Left lower leg: No edema.  Skin:    General: Skin is warm and dry.  Neurological:     Mental Status: She is alert.      Assessment/Plan: Please see individual problem list.  Essential hypertension Assessment & Plan: Chronic issue.  Improving.  Continue HCTZ 25 mg daily and lisinopril 40 mg daily.  Check BMP today.  Discussed monitoring her blood pressure daily over the next 2 weeks and getting me her readings at that time.  Discussed goal of less than 130/80.  If not at goal consistently we may need to consider adding additional medication.  Orders: -     Basic metabolic panel  Colon cancer screening -     Cologuard    Return in about 3 months (around 09/02/2023) for Hypertension.   Minerva Areola  Birdie Sons, MD [facility]

## 2023-06-03 NOTE — Assessment & Plan Note (Signed)
Chronic issue.  Improving.  Continue HCTZ 25 mg daily and lisinopril 40 mg daily.  Check BMP today.  Discussed monitoring her blood pressure daily over the next 2 weeks and getting me her readings at that time.  Discussed goal of less than 130/80.  If not at goal consistently we may need to consider adding additional medication.

## 2023-06-04 ENCOUNTER — Telehealth: Payer: Self-pay | Admitting: Family Medicine

## 2023-06-04 LAB — TROPONIN I (HIGH SENSITIVITY): Troponin I (High Sensitivity): 5 ng/L (ref <18)

## 2023-06-04 LAB — MAGNESIUM: Magnesium: 1.8 mg/dL (ref 1.7–2.4)

## 2023-06-04 NOTE — Telephone Encounter (Signed)
Pt called stating she was taken to the ER because of her BP and she does not know if her medication need to be changed. Pt was at church last night and she felt hot and sweaty so she sat down. Pt stated the nurse at her church checked her BP and her lower number was low

## 2023-06-04 NOTE — Telephone Encounter (Signed)
Called Patient she states last night at church she felt hot and sweaty so she sat down and the nurse at church checked her BP it was ?/66 she does not remember what the top number was. Patient states she does not know anything else except what her friend told her. Patient's friend states she was shaking like she was having a seizure and her eyes rolled back in her head and for a minute or two the Patient did not know anyone around or where she was. The friend stated the church friends put the Patient in a wheel chair rolled her out and took her to the ED. Patient does know she went to the ED. Patient states she does feel a little better but she has a headache like last time when this happened and the ED says it was syncope episode.

## 2023-06-04 NOTE — Discharge Instructions (Signed)
Use Tylenol for pain and fevers.  Up to 1000 mg per dose, up to 4 times per day.  Do not take more than 4000 mg of Tylenol/acetaminophen within 24 hours..  

## 2023-06-04 NOTE — Telephone Encounter (Signed)
Noted.  It is possible that she had cardiogenic syncope as that can lead to some shaking.  She needs to follow-up with me in the office this week if possible or early next week.

## 2023-06-04 NOTE — Telephone Encounter (Signed)
Patient states she still has a little bit of a headache but she did take Tylenol per ED discharge instructions. Patient is scheduled for 06/09/23 at 1:40.

## 2023-06-04 NOTE — Telephone Encounter (Signed)
Noted  

## 2023-06-09 ENCOUNTER — Ambulatory Visit (INDEPENDENT_AMBULATORY_CARE_PROVIDER_SITE_OTHER): Payer: Medicare Other | Admitting: Family Medicine

## 2023-06-09 ENCOUNTER — Encounter: Payer: Self-pay | Admitting: Family Medicine

## 2023-06-09 VITALS — BP 130/82 | HR 79 | Temp 98.2°F | Ht 65.0 in | Wt 145.2 lb

## 2023-06-09 DIAGNOSIS — R55 Syncope and collapse: Secondary | ICD-10-CM | POA: Diagnosis not present

## 2023-06-09 NOTE — Progress Notes (Addendum)
Marikay Alar, MD Phone: [phone]  Deanna Ross is a 72 y.o. female who presents today for follow-up.  Syncope: Patient had a syncopal episode while at church.  She had just stood up as part of the choir and reported feeling dizziness, clammy, and hot.  She sat down and subsequently had a syncopal episode.  The next thing she knew she was out hallway.  She felt a little foggy afterwards though recalls a significant number of details from just after the syncopal episode.  Took her a day to start feeling back to normal.  She noted no incontinence.  No chest pain, shortness of breath, or palpitations.  This has not recurred.  She drinks 50 to 64 ounces of water daily.  She reports a friend advised her that it looked like she was having a seizure as she had some shaking and her eyes rolled back in her head.  She has had this previously and did see cardiology earlier this year and they held off on further treatment options given that she was doing better at the time of their visit.  Social History   Tobacco Use  Smoking Status Never  Smokeless Tobacco Never    Current Outpatient Medications on File Prior to Visit  Medication Sig Dispense Refill   atorvastatin (LIPITOR) 20 MG tablet TAKE 1 TABLET BY MOUTH ONCE  DAILY 100 tablet 2   esomeprazole (NEXIUM) 20 MG capsule TAKE 1 CAPSULE BY MOUTH 3 TIMES  WEEKLY  BEFORE BREAKFAST . 43 capsule 2   hydrochlorothiazide (HYDRODIURIL) 25 MG tablet TAKE 1 TABLET BY MOUTH DAILY 100 tablet 2   lisinopril (ZESTRIL) 40 MG tablet Take 1 tablet (40 mg total) by mouth daily. 90 tablet 3   Multiple Vitamins-Minerals (MULTIVITAMIN ADULT PO) Take by mouth.     RESTASIS 0.05 % ophthalmic emulsion 1 drop 2 (two) times daily.     No current facility-administered medications on file prior to visit.     ROS see history of present illness  Objective  Physical Exam Vitals:   06/09/23 1415  BP: 130/82  Pulse: 79  Temp: 98.2 F (36.8 C)  SpO2: 98%     BP Readings from Last 3 Encounters:  06/09/23 130/82  06/04/23 (!) 121/49  06/03/23 124/76   Wt Readings from Last 3 Encounters:  06/09/23 145 lb 3.2 oz (65.9 kg)  06/03/23 142 lb (64.4 kg)  06/03/23 142 lb 9.6 oz (64.7 kg)    Physical Exam Constitutional:      General: She is not in acute distress.    Appearance: She is not diaphoretic.  Cardiovascular:     Rate and Rhythm: Normal rate and regular rhythm.     Heart sounds: Normal heart sounds.  Pulmonary:     Effort: Pulmonary effort is normal.     Breath sounds: Normal breath sounds.  Musculoskeletal:     Right lower leg: No edema.     Left lower leg: No edema.  Skin:    General: Skin is warm and dry.  Neurological:     Mental Status: She is alert.      Assessment/Plan: Please see individual problem list.  Syncope, unspecified syncope type Assessment & Plan: Patient with recurrent episode of syncope.  Suspect this could be cardiogenic syncope.  Discussed the shaking could go along with a cardiac cause for syncope as well.  Discussed adequate hydration.  Will refer back to cardiology to get their input first.  If cardiology does not feel that this  is a cardiac issue we can refer her to neurology for further evaluation.  Discussed [place] state law regarding driving after syncopal episode.  Discussed she would have to have syncope free period of 6 months prior to returning to driving.  Orders: -     Ambulatory referral to Cardiology -     Basic metabolic panel    Return for as scheduled.   Marikay Alar, MD [facility]

## 2023-06-09 NOTE — Assessment & Plan Note (Deleted)
Patient with recurrent episode of syncope.  Suspect this could be cardiogenic syncope.  Discussed the shaking could go along with a cardiac cause for syncope as well.  Discussed adequate hydration.  Will refer back to cardiology to get their input first.  If cardiology does not feel that this is a cardiac issue we can refer her to neurology for further evaluation.  Discussed [place] state law regarding driving after syncopal episode.  Discussed she would have to have syncopal free period of 6 months prior to returning to driving.

## 2023-06-09 NOTE — Assessment & Plan Note (Signed)
Patient with recurrent episode of syncope.  Suspect this could be cardiogenic syncope.  Discussed the shaking could go along with a cardiac cause for syncope as well.  Discussed adequate hydration.  Will refer back to cardiology to get their input first.  If cardiology does not feel that this is a cardiac issue we can refer her to neurology for further evaluation.  Discussed [place] state law regarding driving after syncopal episode.  Discussed she would have to have syncope free period of 6 months prior to returning to driving.

## 2023-06-09 NOTE — Patient Instructions (Signed)
If you have another syncopal episode please go to the emergency room.

## 2023-06-10 LAB — BASIC METABOLIC PANEL
BUN: 11 mg/dL (ref 6–23)
CO2: 28 meq/L (ref 19–32)
Calcium: 9.1 mg/dL (ref 8.4–10.5)
Chloride: 96 meq/L (ref 96–112)
Creatinine, Ser: 0.75 mg/dL (ref 0.40–1.20)
GFR: 78.91 mL/min (ref >60.00)
Glucose, Bld: 91 mg/dL (ref 70–99)
Potassium: 4 meq/L (ref 3.5–5.1)
Sodium: 132 meq/L — ABNORMAL LOW (ref 135–145)

## 2023-06-12 ENCOUNTER — Other Ambulatory Visit: Payer: Self-pay | Admitting: Family Medicine

## 2023-06-12 DIAGNOSIS — E871 Hypo-osmolality and hyponatremia: Secondary | ICD-10-CM

## 2023-06-12 DIAGNOSIS — I10 Essential (primary) hypertension: Secondary | ICD-10-CM

## 2023-06-12 MED ORDER — HYDROCHLOROTHIAZIDE 12.5 MG PO TABS
12.5000 mg | ORAL_TABLET | Freq: Every day | ORAL | 1 refills | Status: DC
Start: 2023-06-12 — End: 2023-11-10

## 2023-06-18 DIAGNOSIS — Z1211 Encounter for screening for malignant neoplasm of colon: Secondary | ICD-10-CM | POA: Diagnosis not present

## 2023-06-22 LAB — COLOGUARD: COLOGUARD: NEGATIVE

## 2023-06-23 ENCOUNTER — Telehealth: Payer: Self-pay | Admitting: Family Medicine

## 2023-06-23 NOTE — Telephone Encounter (Signed)
This patient has a CT chest ordered from July 2024 that needs to be scheduled for November 2024. Can you get this scheduled for her? Thanks.

## 2023-07-03 ENCOUNTER — Telehealth: Payer: Self-pay

## 2023-07-03 NOTE — Telephone Encounter (Signed)
Transition Care Management Follow-up Telephone Call Date of discharge and from where: 06/04/2023 [facility] How have you been since you were released from the hospital? Patient stated she is feeling much better. Any questions or concerns? No  Items Reviewed: Did the pt receive and understand the discharge instructions provided? Yes  Medications obtained and verified?  No medication prescribed Other? No  Any new allergies since your discharge? No  Dietary orders reviewed? Yes Do you have support at home? Yes   Follow up appointments reviewed:  PCP Hospital f/u appt confirmed? Yes  Scheduled to see Yehuda Mao. Birdie Sons, MD on 06/09/2023 @ [place] at [employer] [facility]. Specialist Hospital f/u appt confirmed? Yes  Scheduled to see Raymon Mutton. Shea Evans, PA-C on 07/11/2023 @ [facility] [place]. Are transportation arrangements needed? No  If their condition worsens, is the pt aware to call PCP or go to the Emergency Dept.? Yes Was the patient provided with contact information for the PCP's office or ED? Yes Was to pt encouraged to call back with questions or concerns? Yes   Jaeley Wiker [facility]  [facility] Guide Direct Dial: [phone]  Website: [email or web address]

## 2023-07-10 NOTE — Progress Notes (Signed)
Cardiology Office Note    Date:  07/11/2023   ID:  Deanna Ross, DOB [birth year], MRN [id number]  PCP:  Deanna Luis, MD  Cardiologist:  Deanna Lemma, MD  Electrophysiologist:  None   Chief Complaint: Syncope  History of Present Illness:   Deanna Ross is a 72 y.o. female with history of recurrent presyncope and syncope, HTN, and HLD who presents for evaluation of syncope.  She was evaluated by Dr. Herbie Ross in 06/2022 for evaluation of near syncope following diarrhea illness, lack of sleep, and diminished oral intake that occurred in 04/2022.  Zio patch in 06/2022, worn for 3 days, showed a predominant rhythm of sinus with an average rate of 68 bpm (range 50 to 101 bpm), 2 atrial runs lasting up to 2.6 seconds noted, and rare supraventricular and ventricular ectopy.  Coronary CTA in 07/2022 showed a calcium score of 0 with no evidence of CAD.  Noncardiac overread notable for 3 mm pulmonary nodules in the right upper and left lower lobes.  Echo previously ordered and remains pending.  Carotid artery ultrasound from 08/2022 showed 1 to 49% bilateral ICA stenosis with antegrade flow of the bilateral vertebral arteries.  Near syncopal episode was felt to be related to dehydration and fatigue.  Felt to be less likely arrhythmia in etiology.  She was seen by her PCP in 08/2022 following an episode of syncope at [facility], feeling lightheaded while walking back to the table.  In this setting, she was seen in our office in 09/2022 and was without further symptoms, following reduction of hydrochlorothiazide, with recommendation to date adequate hydration management of orthostatic dizziness/near syncope.  She was seen in the ED on 05/14/2023 with reports of elevated blood pressure greater than 200 mmHg systolic.  She had been evaluated at an urgent care the day prior and was advised to take an extra lisinopril.  Blood pressure remained elevated, therefore she presented to the ED.  Blood  pressure 182/88.  She had already taken her PTA antihypertensives prior to ED arrival.  EKG unavailable for review.  CT head showed no acute intracranial abnormality.  He was given clonidine 0.1 mg.  Recheck blood pressure 130/71.  She was advised to increase lisinopril from 10 mg to 20 mg daily and to decrease to 10 mg if her systolic reading dropped below 120 mmHg.  She was most recently evaluated in the ED on 06/03/2023 following a syncopal episode.  That evening, while at church, after standing up to sing in the choir, she began to feel dizzy with associated flushing.  She sat down to faint herself and "the next thing I know I am out in the hallway."  No loss of bowel or bladder function.  Reported a friend noted seizure-like activity without significant postictal state noted.  She reported generalized headache thereafter and reported she had headaches following prior episodes of syncope.  BP ranged from 121-140 mmHg systolic.  High-sensitivity troponin negative x 2.  Chest x-ray without acute cardiopulmonary process.  CTA head showed no acute intracranial abnormality with mild cerebral atrophy and microvascular disease as well as mild right frontal scalp soft tissue swelling.  EKG showed sinus rhythm with no acute ST-T changes.  She remained stable in the ED and declined admission.  Follow-up with her PCP on 06/09/2023 and was without further syncopal episodes.  It was recommended she follow-up with cardiology initially, and if workup is reassuring, to pursue neurological evaluation.  She comes in today for follow-up  of the above.  She is accompanied by her husband, who is in the waiting room, and drove.  Back to baseline, no further syncopal episodes.  Denies any symptoms of chest pain or cardiac decompensation, at baseline, or with the above presyncopal/syncopal episodes.  No associated palpitations or diaphoresis.  No nausea or vomiting.  No loss of bowel or bladder function.  No definitive postictal  state.  She is unclear when she began taking a full dose of HCTZ 25 mg daily again this past summer, though indicates that she has been taking a full 25 mg rather than previously recommended to 12.5 mg.  Reports drinks 50 to 64 ounces of water daily along with 2 cups of coffee and a diet soda.  She also reports memory issues with cognitive decline.  She indicates her sister has noted this for the past 5 to 6 years.    Labs independently reviewed: 05/2023 - potassium 4.0, BUN 11, serum creatinine 0.75, magnesium 1.8, Hgb 14.0, PLT 234 04/2023 - TSH normal, albumin 4.0, AST/ALT normal 03/2023 - A1c 5.6, TC 150, TG 159, HDL 54, LDL 63  Past Medical History:  Diagnosis Date   Cancer (HCC)    Has BRCA 2 gene   Heart murmur    High cholesterol    Hypertension    Skin cyst 03/28/2018    Past Surgical History:  Procedure Laterality Date   ABDOMINAL HYSTERECTOMY  2008   BREAST CYST ASPIRATION Left    negative   BREAST EXCISIONAL BIOPSY Left    negative x2 done at same time   TONSILLECTOMY AND ADENOIDECTOMY     TUBAL LIGATION      Current Medications: Current Meds  Medication Sig   atorvastatin (LIPITOR) 20 MG tablet TAKE 1 TABLET BY MOUTH ONCE  DAILY   esomeprazole (NEXIUM) 20 MG capsule TAKE 1 CAPSULE BY MOUTH 3 TIMES  WEEKLY  BEFORE BREAKFAST .   hydrochlorothiazide (HYDRODIURIL) 12.5 MG tablet Take 1 tablet (12.5 mg total) by mouth daily. (Patient taking differently: Take 25 mg by mouth daily.)   lisinopril (ZESTRIL) 40 MG tablet Take 1 tablet (40 mg total) by mouth daily.   Multiple Vitamins-Minerals (MULTIVITAMIN ADULT PO) Take by mouth.   RESTASIS 0.05 % ophthalmic emulsion 1 drop 2 (two) times daily.    Allergies:   Shellfish-derived products, Gadolinium derivatives, Iodine, and Tdap [tetanus-diphth-acell pertussis]   Social History   Socioeconomic History   Marital status: Married    Spouse name: Not on file   Number of children: Not on file   Years of education: Not on  file   Highest education level: 12th grade  Occupational History   Not on file  Tobacco Use   Smoking status: Never   Smokeless tobacco: Never  Vaping Use   Vaping status: Never Used  Substance and Sexual Activity   Alcohol use: No    Alcohol/week: 0.0 standard drinks of alcohol   Drug use: No   Sexual activity: Never    Partners: Male  Other Topics Concern   Not on file  Social History Narrative   Retired 4 years ago from [employer] with husband   3 daughters (48, 29, 81)    7 Grandchildren and 1 great grandchild    Enjoys swimming   2 cats and 1 dog- live inside the house    Social Determinants of Health   Financial Resource Strain: Low Risk  (05/15/2023)   Overall Financial Resource Strain (CARDIA)  Difficulty of Paying Living Expenses: Not very hard  Food Insecurity: No Food Insecurity (05/15/2023)   Hunger Vital Sign    Worried About Running Out of Food in the Last Year: Never true    Ran Out of Food in the Last Year: Never true  Transportation Needs: No Transportation Needs (05/15/2023)   PRAPARE - Administrator, Civil Service (Medical): No    Lack of Transportation (Non-Medical): No  Physical Activity: Sufficiently Active (05/15/2023)   Exercise Vital Sign    Days of Exercise per Week: 4 days    Minutes of Exercise per Session: 50 min  Stress: No Stress Concern Present (05/15/2023)   [employer] of Occupational Health - Occupational Stress Questionnaire    Feeling of Stress : Not at all  Social Connections: Socially Integrated (05/15/2023)   Social Connection and Isolation Panel [NHANES]    Frequency of Communication with Friends and Family: More than three times a week    Frequency of Social Gatherings with Friends and Family: Once a week    Attends Religious Services: 1 to 4 times per year    Active Member of [employer] or Organizations: Yes    Attends Engineer, structural: More than 4 times per year    Marital Status: Married      Family History:  The patient's family history includes BRCA 1/2 in an other family member; Breast cancer (age of onset: 38) in her daughter; Breast cancer (age of onset: 17) in her paternal aunt; Breast cancer (age of onset: 72) in her maternal grandmother; Breast cancer (age of onset: 92) in her sister; Cancer in her daughter and paternal uncle; Hyperlipidemia in her mother; Hypertension in her mother.  ROS:   12-point review of systems is negative unless otherwise noted in the HPI.   EKGs/Labs/Other Studies Reviewed:    Studies reviewed were summarized above. The additional studies were reviewed today:  Carotid artery ultrasound 09/12/2022: IMPRESSION: 1. Mild (1-49%) stenosis proximal right internal carotid artery secondary to mild smooth heterogeneous atherosclerotic plaque. 2. Mild (1-49%) stenosis proximal left internal carotid artery secondary to mild smooth heterogeneous atherosclerotic plaque. 3. Vertebral arteries are patent with normal antegrade flow. __________  Coronary CTA 08/12/2022: FINDINGS: Aorta: Normal size. Mild ascending and descending aorta calcifications. No dissection.   Aortic Valve:  Trileaflet.  No calcifications.   Coronary Arteries:  Normal coronary origin.  Right dominance.   RCA is a dominant artery that gives rise to PDA and PLA. There is no plaque.   Left main gives rise to LAD and LCX arteries.   LAD is a large vessel that has no plaque.   LCX is a non-dominant artery that gives rise to three obtuse marginal branches. There is no plaque.   Other findings:   Normal pulmonary vein drainage into the left atrium.   Normal left atrial appendage without a thrombus.   Normal size of the pulmonary artery.   IMPRESSION: 1. Normal coronary calcium score of 0.  Patient is low risk. 2. Normal coronary origin with right dominance. 3. No evidence of CAD. 4. CAD-RADS 0. Consider non-atherosclerotic causes of chest pain.   Noncardiac  overread: IMPRESSION: 3 mm pulmonary nodules in the right upper and left lower lobes. No follow-up needed if patient is low-risk (and has no known or suspected primary neoplasm). Non-contrast chest CT can be considered in 12 months if patient is high-risk. __________  Zio patch 06/2022:   Predominant underlying Rhythm: Sinus. HR range 50-101 bpm,  avg 68   Rare (<1%) PACs with some couplets & triplets   Rare PACs with no couplets, triplets, bigeminy or trigeminy.   2 Atrial Runs of 5 beats.: Fastest HR Range 150-167 bpm, Avg 161 bpm, 1.9 sec; LongestHR Range 113-128 bpm, Avg 123 bpm, 2.6 sec   No Sustained Arrhythmias: Atrial Tachycardia (AT), Supraventricular Tachycardia (SVT), Atrial Fibrillation (A-Fib), Atrial Flutter (A-Flutter), Sustained Ventricular Tachycardia (VT)   Symptoms of heart fluttering/racing or chest pain noted with sinus rhythm heart rate ranging from 60 to 90 bpm.  No associated PACs or PVCs.   Overall pretty benign study.  No abnormal findings. Relatively rare PACs and PVCs with 2 short little atrial runs would not cause dizziness or giddiness or weakness.  No syncope or near syncope associated with these findings.   Symptoms that were noted were with normal rhythm.     EKG:  EKG is ordered today.  The EKG ordered today demonstrates NSR, 61 bpm, baseline artifact, no acute ST-T changes  Recent Labs: 05/14/2023: ALT 20 05/16/2023: TSH 0.47 06/03/2023: Hemoglobin 14.0; Magnesium 1.8; Platelets 234 06/09/2023: BUN 11; Creatinine, Ser 0.75; Potassium 4.0; Sodium 132  Recent Lipid Panel    Component Value Date/Time   CHOL 150 04/02/2023 0857   TRIG 159.0 (H) 04/02/2023 0857   HDL 54.50 04/02/2023 0857   CHOLHDL 3 04/02/2023 0857   VLDL 31.8 04/02/2023 0857   LDLCALC 63 04/02/2023 0857   LDLDIRECT 75.0 08/24/2018 1020    PHYSICAL EXAM:    VS:  BP (!) 158/78 (BP Location: Left Arm, Patient Position: Sitting, Cuff Size: Normal)   Pulse 74   Ht 5\' 5"  (1.651 m)    Wt 143 lb 12.8 oz (65.2 kg)   SpO2 96%   BMI 23.93 kg/m   BMI: Body mass index is 23.93 kg/m.  Physical Exam Vitals reviewed.  Constitutional:      Appearance: She is well-developed.  HENT:     Head: Normocephalic and atraumatic.  Eyes:     General:        Right eye: No discharge.        Left eye: No discharge.  Neck:     Vascular: No JVD.  Cardiovascular:     Rate and Rhythm: Normal rate and regular rhythm.     Pulses:          Posterior tibial pulses are 2+ on the right side and 2+ on the left side.     Heart sounds: Normal heart sounds, S1 normal and S2 normal. Heart sounds not distant. No midsystolic click and no opening snap. No murmur heard.    No friction rub.  Pulmonary:     Effort: Pulmonary effort is normal. No respiratory distress.     Breath sounds: Normal breath sounds. No decreased breath sounds, wheezing, rhonchi or rales.  Chest:     Chest wall: No tenderness.  Abdominal:     General: There is no distension.  Musculoskeletal:     Cervical back: Normal range of motion.     Right lower leg: No edema.     Left lower leg: No edema.  Skin:    General: Skin is warm and dry.     Nails: There is no clubbing.  Neurological:     Mental Status: She is alert and oriented to person, place, and time.  Psychiatric:        Speech: Speech normal.        Behavior: Behavior normal.  Thought Content: Thought content normal.        Judgment: Judgment normal.     Wt Readings from Last 3 Encounters:  07/11/23 143 lb 12.8 oz (65.2 kg)  06/09/23 145 lb 3.2 oz (65.9 kg)  06/03/23 142 lb (64.4 kg)     Orthostatic vital signs: Lying: 146/82, 70 bpm Sitting: 148/75, 69 bpm Standing: 131/80, 77 bpm Standing x 3 minutes: 134/75, 74 bpm  ASSESSMENT & PLAN:   Recurrent near syncope and syncope: Back to baseline without further syncopal episodes.  Query if this most recent episode occurred in the setting of recent dose increase of HCTZ in 04/2023 from mg to 25 mg.  As  noted above, she was previously symptomatic with near syncope on HCTZ 25 mg with symptomatic improvement following dose reduction.  Would consider reducing HCTZ back down to 12.5 mg daily and see how she does, defer this to PCP.  Episodes are precipitated by headache, may also benefit from neurological evaluation.  Recent coronary CTA showed calcium score of 0, therefore making ischemic heart disease unlikely etiology of her presentation.  Prior carotid artery ultrasound showed nonobstructive disease bilaterally.  Zio patch showed no evidence of significant arrhythmia, high-grade AV block, or prolonged pauses.  Most recent episode occurred after dosage increase in antihypertensive therapy and appears to be positional in etiology.  Suspect some degree of orthostasis contributing.  Obtain echo, placed ZIO AT, repeat carotid artery ultrasound.  If the above evaluation is reassuring, consider myocardial PET/CT.  May need to consider ILR.  No driving until identifiable causes adequately treated or until it has been at least 6 months since last syncopal episode (06/03/2023).  HTN: Blood pressure is mildly elevated in the office today with previously noted concern for orthostasis contributing to episodes of near syncope/syncope.  Orthostatic vital signs as outlined above in the office today.  For now, would allow for some degree of permissive hypertension.,  HLD: LDL 63 in 03/2023.  Calcium score 0 in 07/2022.  Remains on atorvastatin 20 mg.  Cognitive decline: Reports her sister has noted an approximate 5-6 year history of cognitive decline/memory loss in the patient.  May benefit from neurological evaluation.      Disposition: F/u with Dr. Herbie Ross or an APP in 2 months.   Medication Adjustments/Labs and Tests Ordered: Current medicines are reviewed at length with the patient today.  Concerns regarding medicines are outlined above. Medication changes, Labs and Tests ordered today are summarized above and  listed in the Patient Instructions accessible in Encounters.   Signed, [name], PA-C 07/11/2023 12:54 PM     [facility] [place] [address] [place], [place] [address] [phone]

## 2023-07-11 ENCOUNTER — Ambulatory Visit: Payer: Medicare Other | Attending: Physician Assistant | Admitting: Physician Assistant

## 2023-07-11 ENCOUNTER — Ambulatory Visit: Payer: Medicare Other

## 2023-07-11 ENCOUNTER — Encounter: Payer: Self-pay | Admitting: Physician Assistant

## 2023-07-11 VITALS — BP 158/78 | HR 74 | Ht 65.0 in | Wt 143.8 lb

## 2023-07-11 DIAGNOSIS — I10 Essential (primary) hypertension: Secondary | ICD-10-CM | POA: Diagnosis not present

## 2023-07-11 DIAGNOSIS — R55 Syncope and collapse: Secondary | ICD-10-CM

## 2023-07-11 DIAGNOSIS — R4189 Other symptoms and signs involving cognitive functions and awareness: Secondary | ICD-10-CM

## 2023-07-11 DIAGNOSIS — E785 Hyperlipidemia, unspecified: Secondary | ICD-10-CM

## 2023-07-11 NOTE — Patient Instructions (Signed)
Medication Instructions:  - Your physician recommends that you continue on your current medications as directed. Please refer to the Current Medication list given to you today.  *If you need a refill on your cardiac medications before your next appointment, please call your pharmacy*   Lab Work: - none ordered  If you have labs (blood work) drawn today and your tests are completely normal, you will receive your results only by: MyChart Message (if you have MyChart) OR A paper copy in the mail If you have any lab test that is abnormal or we need to change your treatment, we will call you to review the results.   Testing/Procedures:  1) Echocardiogram: - Your physician has requested that you have an echocardiogram. Echocardiography is a painless test that uses sound waves to create images of your heart. It provides your doctor with information about the size and shape of your heart and how well your heart's chambers and valves are working. This procedure takes approximately one hour. There are no restrictions for this procedure. There is a possibility that an IV may need to be started during your test to inject an image enhancing agent. This is done to obtain more optimal pictures of your heart. Therefore we ask that you do at least drink some water prior to coming in to hydrate your veins.   Please do NOT wear cologne, perfume, aftershave, or lotions (deodorant is allowed). Please arrive 15 minutes prior to your appointment time.    2) Carotid ultrasound: - Your physician has requested that you have a carotid duplex. This test is an ultrasound of the carotid arteries in your neck. It looks at blood flow through these arteries that supply the brain with blood. Allow one hour for this exam. There are no restrictions or special instructions.    3) Heart Monitor:  (if your echocardiogram can be scheduled within the next week, please wait until after the echo is completed prior to placing  your heart monitor, if the echo is out further than 1 week, please complete the 2 week wear of your heart monitor prior to having the echo done)   Your physician has requested you wear a ZIO AT monitor for 14  days.  Your monitor will be mailed to your home address within 3-5 business days. This is sent via [employer] from [employer]. However, if you have not received your monitor after 5 business days please send [place] a MyChart message or call the office at [phone], so we may follow up on this for you.   This monitor is a medical device (single patch monitor) that records the heart's electrical activity. Doctors most often use these monitors to diagnose arrhythmias. Arrhythmias are problems with the speed or rhythm of the heartbeat.   [employer] supplies 1 patch per enrollment. Additional stickers are not available.  Please DO NOT apply the patch if you will be having a Nuclear Stress Test, Echocardiogram, Cardiac CT, Cardiac MRI, Chest X-ray during the period you would be wearing the monitor. The patch cannot be worn during these tests.  You cannot remove and re-apply the ZIO patch monitor.   Applying the Monitor: Once you receive your monitor, this will include a small razor, abrader, and 4 alcohol pads. Shave hair from upper left chest Rub abrader disc in 40 strokes over the left upper chest as indicated in your monitor instructions Clean area with 4 enclosed alcohol pads (there may be a mild & brief stinging sensation over  the newly abraded area, but this is normal). Let dry Apply patch as indicated in monitor instructions. Patch will be placed under collarbone on the left side of the chest with arrow pointing upward. Rub adhesive wings for 2 minutes. Remove white label marked "1". Remove the white label marked "2". Rub patch adhesive wings for an 2 minutes.  While looking in a mirror, press and release button in the center of the patch. You may hear a "click". A small green light  will flash 4-6 times and then stop. This will be your indicator that the monitor has been turned on.  Wearing the Monitor: Avoid showering during the first 24 hours of wearing the monitor.  After 24 hours you may shower with the patch on. Take brief showers with your back facing the shower head.  Avoid excessive sweating to help maximize wear time. Do not submerge the device, no hot tubs, and no swimming pools. Keep any lotions or oils away from the patch. Press the button if you feel a symptom. You will hear a small click. Record date, time, and symptoms in the Patient Logbook or App.  Monitor Issues: Call [employer] Customer Care at [phone] if you have questions regarding your Zio Patch Monitor. Call them immediately if you see an orange/ amber colored light blinking on your monitor. If your monitor falls off and you cannot get this reapplied or if you need suggestions for securing your monitor call [employer] at [phone].   Returning the Monitor: Once you have completed wearing your monitor, follow instructions on the last 2 pages of the Patient Logbook. Stick monitor patch on to the last page of the Patient Logbook.  Place Patient Logbook with monitor in the return box provided. Use locking tab on box and tape box closed securely. The return box has pre-paid postage on it.  Place the return box in the regular [place] Mail box as soon as possible It will take anywhere from 1-2 weeks for your provider to receive and review your results once you mail this back. If for some reason you have misplaced your return box then call our office and we can provide another box and/or mail it off for you.   Billing  and Patient Assistance Program Information: We have supplied [employer] with any of your insurance information on file for billing purposes. [employer] offers a sliding scale Patient Assistance Program for patients that do not have insurance, or whose insurance does not completely  cover the cost of the ZIO monitor. You must apply for the Patient Assistance Program to qualify for this discounted rate. To apply, please call [employer] at [phone], select option 1, ask to apply for the Patient Assistance Program. [employer] will ask your household income, and how many people are in your household. They will quote your out-of-pocket cost based on that information. [employer] will also be able to set up for a 13-month, interest-free payment plan if needed.     Follow-Up: At [facility], you and your health needs are our priority.  As part of our continuing mission to provide you with exceptional heart care, we have created designated Provider Care Teams.  These Care Teams include your primary Cardiologist (physician) and Advanced Practice Providers (APPs -  Physician Assistants and Nurse Practitioners) who all work together to provide you with the care you need, when you need it.  We recommend signing up for the patient portal called "MyChart".  Sign up information is provided on this After Visit  Summary.  MyChart is used to connect with patients for Virtual Visits (Telemedicine).  Patients are able to view lab/test results, encounter notes, upcoming appointments, etc.  Non-urgent messages can be sent to your provider as well.   To learn more about what you can do with MyChart, go to [email or web address].    Your next appointment:   2 month(s)  Provider:   You may see Bryan Lemma, MD or one of the following Advanced Practice Providers on your designated Care Team:    [name], PA-C   Other Instructions Echocardiogram An echocardiogram is a test that uses sound waves to make images of your heart. This way of making images is often called ultrasound. The images from this test can help find out many things about your heart, including: The size and shape of your heart. The strength of your heart muscle and how well it's working. The size, thickness, and movement  of your heart's walls. How your heart valves are working. Problems such as: A tumor or a growth from an infection around the heart valves. Areas of heart muscle that aren't working well because of poor blood flow or injury from a heart attack. An aneurysm. This is a weak or damaged part of an artery wall. An artery is a blood vessel. Tell a health care provider about: Any allergies you have. All medicines you're taking, including vitamins, herbs, eye drops, creams, and over-the-counter medicines. Any bleeding problems you have. Any surgeries you've had. Any medical problems you have. Whether you're pregnant or may be pregnant. What are the risks? Your health care provider will talk with you about risks. These may include an allergic reaction to IV dye that may be used during the test. What happens before the test? You don't need to do anything to get ready for this test. You may eat and drink normally. What happens during the test?  You'll take off your clothes from the waist up and put on a hospital gown. Sticky patches called electrodes may be placed on your chest. These will be connected to a machine that monitors your heart rate and rhythm. You'll lie down on a table for the exam. A wand covered in gel will be moved over your chest. Sound waves from the wand will go to your heart and bounce back--or "echo" back. The sound waves will go to a computer that uses them to make images of your heart. The images can be viewed on a monitor. The images will also be recorded on the computer so your provider can look at them later. You may be asked to change positions or hold your breath for a short time. This makes it easier to get different views or better views of your heart. In some cases, you may be given a dye through an IV. The IV is put into one of your veins. This dye can make the areas of your heart easier to see. The procedure may vary among providers and hospitals. What can I expect  after the test? You may return to your normal diet, activities, and medicines unless your provider tells you not to. If an IV was placed for the test, it will be removed. It's up to you to get the results of your test. Ask your provider, or the department that's doing the test, when your results will be ready. This information is not intended to replace advice given to you by your health care provider. Make sure you discuss any questions you have with  your health care provider. Document Revised: 11/08/2022 Document Reviewed: 11/08/2022 Elsevier Patient Education  2024 [employer].

## 2023-07-14 ENCOUNTER — Other Ambulatory Visit (INDEPENDENT_AMBULATORY_CARE_PROVIDER_SITE_OTHER): Payer: Medicare Other

## 2023-07-14 ENCOUNTER — Ambulatory Visit: Payer: Medicare Other

## 2023-07-14 VITALS — BP 132/84

## 2023-07-14 DIAGNOSIS — E871 Hypo-osmolality and hyponatremia: Secondary | ICD-10-CM

## 2023-07-14 DIAGNOSIS — I10 Essential (primary) hypertension: Secondary | ICD-10-CM

## 2023-07-14 LAB — BASIC METABOLIC PANEL
BUN: 11 mg/dL (ref 6–23)
CO2: 30 meq/L (ref 19–32)
Calcium: 9.6 mg/dL (ref 8.4–10.5)
Chloride: 97 meq/L (ref 96–112)
Creatinine, Ser: 0.69 mg/dL (ref 0.40–1.20)
GFR: 85.96 mL/min (ref >60.00)
Glucose, Bld: 97 mg/dL (ref 70–99)
Potassium: 3.9 meq/L (ref 3.5–5.1)
Sodium: 136 meq/L (ref 135–145)

## 2023-07-14 NOTE — Progress Notes (Signed)
Pt presented for BP check. Pt was identified though two identifiers. After going over med list pt confirmed taking: hydrochlorothiazide (HYDRODIURIL) 12.5 MG  & lisinopril (ZESTRIL) 40 MG    Pt denies: head ache, nausea, chest pain, blurred vision, dizziness, SOB, numbness, or tingling    Pt BP after 5 minutes of rest:   BP: 132/84  P: 68

## 2023-07-21 DIAGNOSIS — R55 Syncope and collapse: Secondary | ICD-10-CM

## 2023-07-22 ENCOUNTER — Ambulatory Visit (INDEPENDENT_AMBULATORY_CARE_PROVIDER_SITE_OTHER): Payer: Medicare Other | Admitting: *Deleted

## 2023-07-22 VITALS — BP 146/69 | Ht 65.0 in | Wt 140.0 lb

## 2023-07-22 DIAGNOSIS — Z780 Asymptomatic menopausal state: Secondary | ICD-10-CM

## 2023-07-22 DIAGNOSIS — R55 Syncope and collapse: Secondary | ICD-10-CM | POA: Diagnosis not present

## 2023-07-22 DIAGNOSIS — Z0000 Encounter for general adult medical examination without abnormal findings: Secondary | ICD-10-CM

## 2023-07-22 NOTE — Progress Notes (Signed)
Subjective:   Deanna Ross is a 72 y.o. female who presents for Medicare Annual (Subsequent) preventive examination.  Visit Complete: Virtual I connected with  Deanna Ross on 07/22/23 by a audio enabled telemedicine application and verified that I am speaking with the correct person using two identifiers.  Patient Location: Home  Provider Location: Office/Clinic  I discussed the limitations of evaluation and management by telemedicine. The patient expressed understanding and agreed to proceed.  Vital Signs: Because this visit was a virtual/telehealth visit, some criteria may be missing or patient reported. Any vitals not documented were not able to be obtained and vitals that have been documented are patient reported.  Patient Medicare AWV questionnaire was completed by the patient on 07/15/23; I have confirmed that all information answered by patient is correct and no changes since this date.  Cardiac Risk Factors include: advanced age (>51men, >44 women);dyslipidemia;hypertension     Objective:    Today's Vitals   07/22/23 1403 07/22/23 1426  BP: (!) 143/70 (!) 146/69  Weight: 140 lb (63.5 kg)   Height: 5\' 5"  (1.651 m)    Body mass index is 23.3 kg/m.     07/22/2023    2:19 PM 06/03/2023    9:27 PM 05/14/2023    9:01 AM 07/18/2022    2:12 PM 05/12/2022   12:14 PM 07/17/2021   12:29 PM 04/19/2021    2:34 AM  Advanced Directives  Does Patient Have a Medical Advance Directive? No No No Yes No No No  Type of Theme park manager;Living will     Does patient want to make changes to medical advance directive?    No - Patient declined     Copy of Healthcare Power of Attorney in Chart?    No - copy requested     Would patient like information on creating a medical advance directive? No - Patient declined  No - Patient declined  No - Patient declined No - Patient declined No - Patient declined    Current Medications (verified) Outpatient  Encounter Medications as of 07/22/2023  Medication Sig   atorvastatin (LIPITOR) 20 MG tablet TAKE 1 TABLET BY MOUTH ONCE  DAILY   esomeprazole (NEXIUM) 20 MG capsule TAKE 1 CAPSULE BY MOUTH 3 TIMES  WEEKLY  BEFORE BREAKFAST .   hydrochlorothiazide (HYDRODIURIL) 12.5 MG tablet Take 1 tablet (12.5 mg total) by mouth daily. (Patient taking differently: Take 25 mg by mouth daily.)   lisinopril (ZESTRIL) 40 MG tablet Take 1 tablet (40 mg total) by mouth daily.   Multiple Vitamins-Minerals (MULTIVITAMIN ADULT PO) Take by mouth.   RESTASIS 0.05 % ophthalmic emulsion 1 drop 2 (two) times daily.   No facility-administered encounter medications on file as of 07/22/2023.    Allergies (verified) Shellfish-derived products, Gadolinium derivatives, Iodine, and Tdap [tetanus-diphth-acell pertussis]   History: Past Medical History:  Diagnosis Date   Cancer (HCC)    Has BRCA 2 gene   Heart murmur    High cholesterol    Hypertension    Skin cyst 03/28/2018   Past Surgical History:  Procedure Laterality Date   ABDOMINAL HYSTERECTOMY  2008   BREAST CYST ASPIRATION Left    negative   BREAST EXCISIONAL BIOPSY Left    negative x2 done at same time   TONSILLECTOMY AND ADENOIDECTOMY     TUBAL LIGATION     Family History  Problem Relation Age of Onset   Hyperlipidemia Mother    Hypertension  Mother    Cancer Daughter        Breast   Breast cancer Daughter 26   Breast cancer Paternal Aunt 18   Breast cancer Maternal Grandmother 67   Breast cancer Sister 79   Cancer Paternal Uncle        Prostate   BRCA 1/2 Other        pt states she is BRCA 2 positive   Social History   Socioeconomic History   Marital status: Married    Spouse name: Not on file   Number of children: Not on file   Years of education: Not on file   Highest education level: 12th grade  Occupational History   Not on file  Tobacco Use   Smoking status: Never   Smokeless tobacco: Never  Vaping Use   Vaping status: Never  Used  Substance and Sexual Activity   Alcohol use: No    Alcohol/week: 0.0 standard drinks of alcohol   Drug use: No   Sexual activity: Never    Partners: Male  Other Topics Concern   Not on file  Social History Narrative   Retired 4 years ago from [employer] with husband   3 daughters (48, 56, 96)    7 Grandchildren and 1 great grandchild    Enjoys swimming   2 cats and 1 dog- live inside the house    Social Determinants of Health   Financial Resource Strain: Low Risk  (07/15/2023)   Overall Financial Resource Strain (CARDIA)    Difficulty of Paying Living Expenses: Not hard at all  Food Insecurity: No Food Insecurity (07/15/2023)   Hunger Vital Sign    Worried About Running Out of Food in the Last Year: Never true    Ran Out of Food in the Last Year: Never true  Transportation Needs: No Transportation Needs (07/15/2023)   PRAPARE - Administrator, Civil Service (Medical): No    Lack of Transportation (Non-Medical): No  Physical Activity: Insufficiently Active (07/15/2023)   Exercise Vital Sign    Days of Exercise per Week: 3 days    Minutes of Exercise per Session: 30 min  Stress: No Stress Concern Present (07/15/2023)   [employer] of Occupational Health - Occupational Stress Questionnaire    Feeling of Stress : Not at all  Social Connections: Socially Integrated (07/15/2023)   Social Connection and Isolation Panel [NHANES]    Frequency of Communication with Friends and Family: More than three times a week    Frequency of Social Gatherings with Friends and Family: Twice a week    Attends Religious Services: More than 4 times per year    Active Member of [employer] or Organizations: Yes    Attends Engineer, structural: More than 4 times per year    Marital Status: Married    Tobacco Counseling Counseling given: Not Answered   Clinical Intake:  Pre-visit preparation completed: Yes  Pain : No/denies pain     BMI - recorded:  23.3 Nutritional Status: BMI of 19-24  Normal Nutritional Risks: None Diabetes: No  How often do you need to have someone help you when you read instructions, pamphlets, or other written materials from your doctor or pharmacy?: 1 - Never  Interpreter Needed?: No  Information entered by :: R. Pedrohenrique Mcconville LPN   Activities of Daily Living    07/15/2023    1:56 PM  In your present state of health, do you have any difficulty performing  the following activities:  Hearing? 0  Vision? 0  Difficulty concentrating or making decisions? 1  Walking or climbing stairs? 0  Dressing or bathing? 0  Doing errands, shopping? 0  Preparing Food and eating ? N  Using the Toilet? N  In the past six months, have you accidently leaked urine? N  Do you have problems with loss of bowel control? N  Managing your Medications? N  Managing your Finances? N  Housekeeping or managing your Housekeeping? N    Patient Care Team: Glori Luis, MD as PCP - General (Family Medicine) Marykay Lex, MD as PCP - Cardiology (Cardiology)  Indicate any recent Medical Services you may have received from other than [facility] providers in the past year (date may be approximate).     Assessment:   This is a routine wellness examination for [place].  Hearing/Vision screen Hearing Screening - Comments:: Hearing loss in left Vision Screening - Comments:: glasses   Goals Addressed             This Visit's Progress    Patient Stated       Wants to get back in the swimming pool       Depression Screen    07/22/2023    2:14 PM 06/09/2023    2:16 PM 06/03/2023    7:57 AM 05/16/2023   10:59 AM 04/02/2023    8:13 AM 10/02/2022    8:29 AM 07/18/2022    2:14 PM  PHQ 2/9 Scores  PHQ - 2 Score 0 1 1 2 1 2  0  PHQ- 9 Score 1 3 5 3 3 6      Fall Risk    07/15/2023    1:56 PM 06/09/2023    2:16 PM 06/03/2023    7:57 AM 05/16/2023   10:59 AM 04/02/2023    8:13 AM  Fall Risk   Falls in the past year? 1 0 0 0 0   Number falls in past yr: 1 0 0 0 0  Injury with Fall? 0 0 0 0 0  Risk for fall due to : History of fall(s);Mental status change;Impaired mobility No Fall Risks No Fall Risks No Fall Risks No Fall Risks  Follow up Falls evaluation completed;Falls prevention discussed Falls evaluation completed Falls evaluation completed Falls evaluation completed Falls evaluation completed    MEDICARE RISK AT HOME: Medicare Risk at Home Any stairs in or around the home?: Yes If so, are there any without handrails?: No Home free of loose throw rugs in walkways, pet beds, electrical cords, etc?: No Adequate lighting in your home to reduce risk of falls?: Yes Life alert?: No Use of a cane, walker or w/c?: No Grab bars in the bathroom?: No Shower chair or bench in shower?: No Elevated toilet seat or a handicapped toilet?: No      Cognitive Function:    07/10/2018   10:15 AM 07/09/2017   10:44 AM 07/09/2016    8:28 AM  MMSE - Mini Mental State Exam  Orientation to time 5 5 5   Orientation to Place 5 5 5   Registration 3 3 3   Attention/ Calculation 5 5 5   Recall 3 3 3   Language- name 2 objects 2 2 2   Language- repeat 1 1 1   Language- follow 3 step command 3 3 3   Language- read & follow direction 1 1 1   Write a sentence 1 1 1   Copy design 1 0 1  Copy design-comments  difficulty copying the design  Total score 30 29 30         07/22/2023    2:21 PM 07/18/2022    2:22 PM 07/17/2021   10:10 AM 07/14/2019    9:52 AM  6CIT Screen  What Year? 0 points 0 points 0 points 0 points  What month? 0 points 0 points 0 points 0 points  What time? 0 points 0 points 0 points 0 points  Count back from 20 0 points 0 points 0 points 0 points  Months in reverse 0 points 0 points 0 points 0 points  Repeat phrase 0 points  0 points 0 points  Total Score 0 points  0 points 0 points    Immunizations Immunization History  Administered Date(s) Administered   Fluad Quad(high Dose 65+) 07/12/2019, 07/17/2020    Fluad Trivalent(High Dose 65+) 06/03/2023   Influenza Split 06/23/2014   Influenza, High Dose Seasonal PF 06/06/2016, 07/09/2017, 07/10/2018, 07/10/2021   Influenza-Unspecified 07/06/2015, 06/10/2022   PFIZER(Purple Top)SARS-COV-2 Vaccination 11/18/2019, 12/09/2019, 07/17/2020, 04/13/2021   Pfizer Covid-19 Vaccine Bivalent Booster 70yrs & up 08/16/2022   Pneumococcal Conjugate-13 11/30/2014   Pneumococcal Polysaccharide-23 07/09/2016   Unspecified SARS-COV-2 Vaccination 07/01/2023    TDAP status: Due, Education has been provided regarding the importance of this vaccine. Advised may receive this vaccine at local pharmacy or Health Dept. Aware to provide a copy of the vaccination record if obtained from local pharmacy or Health Dept. Verbalized acceptance and understanding.declines because of a reaction  Flu Vaccine status: Up to date  Pneumococcal vaccine status: Up to date  Covid-19 vaccine status: Completed vaccines  Qualifies for Shingles Vaccine? Yes   Zostavax completed Yes   Shingrix Completed?: No.    Education has been provided regarding the importance of this vaccine. Patient has been advised to call insurance company to determine out of pocket expense if they have not yet received this vaccine. Advised may also receive vaccine at local pharmacy or Health Dept. Verbalized acceptance and understanding.  Screening Tests Health Maintenance  Topic Date Due   DTaP/Tdap/Td (1 - Tdap) Never done   Zoster Vaccines- Shingrix (1 of 2) Never done   MAMMOGRAM  04/24/2023   Medicare Annual Wellness (AWV)  07/19/2023   COVID-19 Vaccine (7 - 2023-24 season) 08/26/2023   Fecal DNA (Cologuard)  06/17/2026   Pneumonia Vaccine 19+ Years old  Completed   INFLUENZA VACCINE  Completed   DEXA SCAN  Completed   Hepatitis C Screening  Completed   HPV VACCINES  Aged Out   Colonoscopy  Discontinued    Health Maintenance  Health Maintenance Due  Topic Date Due   DTaP/Tdap/Td (1 - Tdap)  Never done   Zoster Vaccines- Shingrix (1 of 2) Never done   MAMMOGRAM  04/24/2023   Medicare Annual Wellness (AWV)  07/19/2023    Colorectal cancer screening: Type of screening: Cologuard. Completed 05/2023. Repeat every 3 years  Mammogram Status Completed 04/2022 ordered placed on 04/02/2023  Bone Density status: Ordered 07/22/23. Pt provided with contact info and advised to call to schedule appt.  Lung Cancer Screening: (Low Dose CT Chest recommended if Age 69-80 years, 20 pack-year currently smoking OR have quit w/in 15years.) does not qualify.    Additional Screening:  Hepatitis C Screening: does qualify; Completed 06/2016  Vision Screening: Recommended annual ophthalmology exams for early detection of glaucoma and other disorders of the eye. Is the patient up to date with their annual eye exam?  Yes  Who is the provider or what is the name  of the office in which the patient attends annual eye exams? [facility] If pt is not established with a provider, would they like to be referred to a provider to establish care? No .   Dental Screening: Recommended annual dental exams for proper oral hygiene   Community Resource Referral / Chronic Care Management: CRR required this visit?  No   CCM required this visit?  No     Plan:     I have personally reviewed and noted the following in the patient's chart:   Medical and social history Use of alcohol, tobacco or illicit drugs  Current medications and supplements including opioid prescriptions. Patient is not currently taking opioid prescriptions. Functional ability and status Nutritional status Physical activity Advanced directives List of other physicians Hospitalizations, surgeries, and ER visits in previous 12 months Vitals Screenings to include cognitive, depression, and falls Referrals and appointments  In addition, I have reviewed and discussed with patient certain preventive protocols, quality metrics, and  best practice recommendations. A written personalized care plan for preventive services as well as general preventive health recommendations were provided to patient.     Sydell Axon, LPN   62/70/3500   After Visit Summary: (MyChart) Due to this being a telephonic visit, the after visit summary with patients personalized plan was offered to patient via MyChart   Nurse Notes: See routing comments

## 2023-07-22 NOTE — Patient Instructions (Addendum)
Deanna Ross , Thank you for taking time to come for your Medicare Wellness Visit. I appreciate your ongoing commitment to your health goals. Please review the following plan we discussed and let me know if I can assist you in the future.   Referrals/Orders/Follow-Ups/Clinician Recommendations: Bone Density/Dexa  Remember to update your vaccines. You have an order for:  []   2D Mammogram  []   3D Mammogram  [x]   Bone Density     Please call for appointment:  [facility]  [address] [place] [address] [phone]    Make sure to wear two-piece clothing.  No lotions, powders, or deodorants the day of the appointment. Make sure to bring picture ID and insurance card.  Bring list of medications you are currently taking including any supplements.   Schedule your [place] screening mammogram through MyChart!   Log into your MyChart account.  Go to 'Visit' (or 'Appointments' if on mobile App) --> Schedule an Appointment  Under 'Select a Reason for Visit' choose the Mammogram Screening option.  Complete the pre-visit questions and select the time and place that best fits your schedule.    This is a list of the screening recommended for you and due dates:  Health Maintenance  Topic Date Due   DTaP/Tdap/Td vaccine (1 - Tdap) Never done   Zoster (Shingles) Vaccine (1 of 2) Never done   Mammogram  04/24/2023   COVID-19 Vaccine (7 - 2023-24 season) 08/26/2023   Medicare Annual Wellness Visit  07/21/2024   Cologuard (Stool DNA test)  06/17/2026   Pneumonia Vaccine  Completed   Flu Shot  Completed   DEXA scan (bone density measurement)  Completed   Hepatitis C Screening  Completed   HPV Vaccine  Aged Out   Colon Cancer Screening  Discontinued    Advanced directives: (Declined) Advance directive discussed with you today. Even though you declined this today, please call our office should you change your mind, and we can give  you the proper paperwork for you to fill out.  Next Medicare Annual Wellness Visit scheduled for next year: Yes 07/28/24 @ 9:00

## 2023-07-28 ENCOUNTER — Telehealth: Payer: Self-pay | Admitting: Cardiology

## 2023-07-28 NOTE — Telephone Encounter (Signed)
Pt called and made aware that she will wear the monitor for the full 2 weeks and scheduling team was msg'd to resched the echo (and [place])

## 2023-07-28 NOTE — Telephone Encounter (Signed)
Patient stated she has only been wearing her heart monitor for 9 days and has an echocardiogram test scheduled on 11/6.  Patient wants advice on next steps.

## 2023-07-30 ENCOUNTER — Telehealth: Payer: Self-pay | Admitting: Family Medicine

## 2023-07-30 ENCOUNTER — Other Ambulatory Visit: Payer: Medicare Other

## 2023-07-30 NOTE — Telephone Encounter (Signed)
Lft pt vm to call ofc to sch CT. thanks 

## 2023-07-30 NOTE — Telephone Encounter (Signed)
Patient states she is returning our call.  I read Deanna Ross's message to patient.  I was unable to reach [name] at the time of the call.  I let patient know that I will send her a message so she can call her back.

## 2023-08-06 ENCOUNTER — Other Ambulatory Visit: Payer: Self-pay | Admitting: Family Medicine

## 2023-08-07 ENCOUNTER — Ambulatory Visit: Payer: Medicare Other

## 2023-08-11 ENCOUNTER — Ambulatory Visit
Admission: RE | Admit: 2023-08-11 | Discharge: 2023-08-11 | Disposition: A | Discharge status: Home / Self Care | Payer: Medicare Other | Source: Ambulatory Visit | Attending: Family Medicine | Admitting: Family Medicine

## 2023-08-11 DIAGNOSIS — M85852 Other specified disorders of bone density and structure, left thigh: Secondary | ICD-10-CM | POA: Diagnosis not present

## 2023-08-11 DIAGNOSIS — Z1231 Encounter for screening mammogram for malignant neoplasm of breast: Secondary | ICD-10-CM | POA: Insufficient documentation

## 2023-08-11 DIAGNOSIS — Z780 Asymptomatic menopausal state: Secondary | ICD-10-CM | POA: Insufficient documentation

## 2023-08-12 ENCOUNTER — Ambulatory Visit
Admission: RE | Admit: 2023-08-12 | Discharge: 2023-08-12 | Disposition: A | Discharge status: Home / Self Care | Payer: Medicare Other | Source: Ambulatory Visit | Attending: Family Medicine | Admitting: Family Medicine

## 2023-08-12 DIAGNOSIS — R918 Other nonspecific abnormal finding of lung field: Secondary | ICD-10-CM | POA: Diagnosis not present

## 2023-08-12 DIAGNOSIS — J984 Other disorders of lung: Secondary | ICD-10-CM | POA: Diagnosis not present

## 2023-08-12 DIAGNOSIS — I700 Atherosclerosis of aorta: Secondary | ICD-10-CM | POA: Diagnosis not present

## 2023-08-14 ENCOUNTER — Ambulatory Visit (INDEPENDENT_AMBULATORY_CARE_PROVIDER_SITE_OTHER): Payer: Medicare Other

## 2023-08-14 ENCOUNTER — Ambulatory Visit: Payer: Medicare Other | Attending: Physician Assistant

## 2023-08-14 ENCOUNTER — Encounter: Payer: Self-pay | Admitting: Emergency Medicine

## 2023-08-14 DIAGNOSIS — R55 Syncope and collapse: Secondary | ICD-10-CM

## 2023-08-14 LAB — ECHOCARDIOGRAM COMPLETE
AR max vel: 2.13 cm2
AV Area VTI: 2.15 cm2
AV Area mean vel: 2.14 cm2
AV Mean grad: 3 mm[Hg]
AV Peak grad: 6.2 mm[Hg]
Ao pk vel: 1.24 m/s
Area-P 1/2: 2.62 cm2
Calc EF: 56.3 %
S' Lateral: 2.9 cm
Single Plane A2C EF: 56.5 %
Single Plane A4C EF: 55.9 %

## 2023-09-03 ENCOUNTER — Ambulatory Visit: Payer: Medicare Other | Admitting: Family Medicine

## 2023-09-03 ENCOUNTER — Encounter: Payer: Self-pay | Admitting: Family Medicine

## 2023-09-03 VITALS — BP 114/78 | HR 72 | Temp 98.3°F | Ht 65.0 in | Wt 146.2 lb

## 2023-09-03 DIAGNOSIS — R55 Syncope and collapse: Secondary | ICD-10-CM | POA: Diagnosis not present

## 2023-09-03 DIAGNOSIS — K219 Gastro-esophageal reflux disease without esophagitis: Secondary | ICD-10-CM

## 2023-09-03 DIAGNOSIS — I10 Essential (primary) hypertension: Secondary | ICD-10-CM | POA: Diagnosis not present

## 2023-09-03 DIAGNOSIS — F320 Major depressive disorder, single episode, mild: Secondary | ICD-10-CM | POA: Diagnosis not present

## 2023-09-03 MED ORDER — ESOMEPRAZOLE MAGNESIUM 20 MG PO CPDR: 20.0000 mg | DELAYED_RELEASE_CAPSULE | Freq: Every day | ORAL | 1 refills | Status: DC

## 2023-09-03 NOTE — Assessment & Plan Note (Signed)
Chronic issue.  Has more symptoms on Nexium 3 times weekly.  She can increase Nexium 20 mg mams to once daily.  Refill provided.  She will let us know if this is not helpful.

## 2023-09-03 NOTE — Assessment & Plan Note (Signed)
Chronic issue.  Mild.  She will monitor for worsening symptoms.

## 2023-09-03 NOTE — Patient Instructions (Signed)
Nice to see you. I have sent your Nexium to the pharmacy.  If taking it daily does not help with your reflux please let us know.

## 2023-09-03 NOTE — Assessment & Plan Note (Signed)
Chronic issue.  Adequately controlled here though reports being elevated at home.  She will come in in the next week or 2 for nurse visit to assess accuracy of her home BP cuffs.  For now she will continue HCTZ 12.5 mg daily and lisinopril 40 mg daily.

## 2023-09-03 NOTE — Assessment & Plan Note (Signed)
Patient reports no recurrence.  Discussed [place] law of stopping driving for 6 months after syncopal episode.  Advised that she would need to discuss this when she follows up with her cardiologist.

## 2023-09-03 NOTE — Progress Notes (Signed)
Marikay Alar, MD Phone: [phone]  Deanna Ross is a 72 y.o. female who presents today for f/u.  HYPERTENSION Disease Monitoring Home BP Monitoring notes higher numbers at home Chest pain- no    Dyspnea- no Medications Compliance-  taking hydrochlorothiazide, lisinopril.   Edema- no BMET    Component Value Date/Time   NA 136 07/14/2023 1007   NA 140 07/23/2022 1032   K 3.9 07/14/2023 1007   CL 97 07/14/2023 1007   CO2 30 07/14/2023 1007   GLUCOSE 97 07/14/2023 1007   BUN 11 07/14/2023 1007   BUN 10 07/23/2022 1032   CREATININE 0.69 07/14/2023 1007   CALCIUM 9.6 07/14/2023 1007   GFRNONAA >60 06/03/2023 2128   GERD:   Reflux symptoms: only on days she is not taking nexium   Abd pain: no   Blood in stool: no  Dysphagia: no   EGD: reports one in the past with normal findings  Medication: nexium three times a week  Anxiety and depression: Patient notes mild related to not being up to drive.  She has not been able to drive given her prior syncopal episodes.  She notes no SI.  She does not feel as though she needs any medication or therapy for this.  Syncope: Patient notes no recurrent syncopal episodes.  Social History   Tobacco Use  Smoking Status Never  Smokeless Tobacco Never    Current Outpatient Medications on File Prior to Visit  Medication Sig Dispense Refill   atorvastatin (LIPITOR) 20 MG tablet TAKE 1 TABLET BY MOUTH ONCE  DAILY 100 tablet 2   hydrochlorothiazide (HYDRODIURIL) 12.5 MG tablet Take 1 tablet (12.5 mg total) by mouth daily. (Patient taking differently: Take 25 mg by mouth daily.) 100 tablet 1   lisinopril (ZESTRIL) 40 MG tablet Take 1 tablet (40 mg total) by mouth daily. 90 tablet 3   Multiple Vitamins-Minerals (MULTIVITAMIN ADULT PO) Take by mouth.     RESTASIS 0.05 % ophthalmic emulsion 1 drop 2 (two) times daily.     No current facility-administered medications on file prior to visit.     ROS see history of present  illness  Objective  Physical Exam Vitals:   09/03/23 0832  BP: 114/78  Pulse: 72  Temp: 98.3 F (36.8 C)  SpO2: 95%    BP Readings from Last 3 Encounters:  09/03/23 114/78  07/22/23 (!) 146/69  07/14/23 132/84   Wt Readings from Last 3 Encounters:  09/03/23 146 lb 3.2 oz (66.3 kg)  07/22/23 140 lb (63.5 kg)  07/11/23 143 lb 12.8 oz (65.2 kg)    Physical Exam Constitutional:      General: She is not in acute distress.    Appearance: She is not diaphoretic.  Cardiovascular:     Rate and Rhythm: Normal rate and regular rhythm.     Heart sounds: Normal heart sounds.  Pulmonary:     Effort: Pulmonary effort is normal.     Breath sounds: Normal breath sounds.  Skin:    General: Skin is warm and dry.  Neurological:     Mental Status: She is alert.      Assessment/Plan: Please see individual problem list.  Essential hypertension Assessment & Plan: Chronic issue.  Adequately controlled here though reports being elevated at home.  She will come in in the next week or 2 for nurse visit to assess accuracy of her home BP cuffs.  For now she will continue HCTZ 12.5 mg daily and lisinopril 40 mg daily.  Syncope, unspecified syncope type Assessment & Plan: Patient reports no recurrence.  Discussed [place] law of stopping driving for 6 months after syncopal episode.  Advised that she would need to discuss this when she follows up with her cardiologist.   Gastroesophageal reflux disease, unspecified whether esophagitis present Assessment & Plan: Chronic issue.  Has more symptoms on Nexium 3 times weekly.  She can increase Nexium 20 mg mams to once daily.  Refill provided.  She will let us know if this is not helpful.  Orders: -     Esomeprazole Magnesium; Take 1 capsule (20 mg total) by mouth daily.  Dispense: 90 capsule; Refill: 1  Depression, major, single episode, mild (HCC) Assessment & Plan: Chronic issue.  Mild.  She will monitor for worsening  symptoms.      Return in about 2 weeks (around 09/17/2023) for Nurse BP check, 6 months transfer of care.   Marikay Alar, MD [facility]

## 2023-09-09 ENCOUNTER — Ambulatory Visit: Payer: Medicare Other | Attending: Physician Assistant | Admitting: Emergency Medicine

## 2023-09-09 ENCOUNTER — Encounter: Payer: Self-pay | Admitting: Physician Assistant

## 2023-09-09 VITALS — BP 120/72 | HR 74 | Ht 64.7 in | Wt 143.8 lb

## 2023-09-09 DIAGNOSIS — R4189 Other symptoms and signs involving cognitive functions and awareness: Secondary | ICD-10-CM | POA: Diagnosis not present

## 2023-09-09 DIAGNOSIS — R55 Syncope and collapse: Secondary | ICD-10-CM

## 2023-09-09 DIAGNOSIS — I10 Essential (primary) hypertension: Secondary | ICD-10-CM | POA: Diagnosis not present

## 2023-09-09 DIAGNOSIS — E785 Hyperlipidemia, unspecified: Secondary | ICD-10-CM | POA: Diagnosis not present

## 2023-09-09 NOTE — Patient Instructions (Signed)
Medication Instructions:  No changes at this time.   *If you need a refill on your cardiac medications before your next appointment, please call your pharmacy*   Lab Work: None  If you have labs (blood work) drawn today and your tests are completely normal, you will receive your results only by: MyChart Message (if you have MyChart) OR A paper copy in the mail If you have any lab test that is abnormal or we need to change your treatment, we will call you to review the results.   Testing/Procedures: None   Follow-Up: At [facility], you and your health needs are our priority.  As part of our continuing mission to provide you with exceptional heart care, we have created designated Provider Care Teams.  These Care Teams include your primary Cardiologist (physician) and Advanced Practice Providers (APPs -  Physician Assistants and Nurse Practitioners) who all work together to provide you with the care you need, when you need it.   Your next appointment:   3 month(s)  Provider:   [name], PA-C

## 2023-09-09 NOTE — Progress Notes (Signed)
Cardiology Office Note:    Date:  09/09/2023  ID:  Deanna Ross, DOB June 12, 1951, MRN [id number] PCP: Deanna Luis, MD  [facility] Providers Cardiologist:  Deanna Lemma, MD       Patient Profile:      Deanna Ross is a 72 year old female with visit pertinent history of HTN, HLD, recurrent presyncope and syncope.  She was initially evaluated by Dr. Herbie Ross in October 2023 for near syncope following diarrhea illness, lack of sleep, diminished oral intake that occurred in August 2023.  Zio patch in October 2023, worn for 3 days showed a predominant rhythm of sinus with an average rate of 68 bpm (range 50 to 101 bpm), 2 atrial runs lasting up to 2.6 seconds noted and rare supraventricular and ventricular ectopy.  Coronary CTA in November 2023 showed a calcium score of 0 with no evidence of CAD.  Noncardiac over read notable for 3 mm pulmonary nodules in the right upper and left lower lobes.  Carotid ultrasound from December 2023 showed 1-29% bilateral ICA stenosis with antegrade flow of the bilateral vertebral arteries.  Near syncopal episode was felt to be related to dehydration and fatigue.  Was seen by PCP December 2023 following episode of syncope and subsequently seen in our office January 2024 she was without symptoms at that time following reduction of hydrochlorothiazide with recommendation to increase hydration and management of orthostatic dizziness/near syncope.  She was seen in the ED on August 2024 for elevated blood pressure, it was advised for her to increase her lisinopril from 10 mg to 20 mg daily.  She was most recently evaluated in the ED on September 2024 following a syncopal episode.  She was at church and after standing up to sing in the choir she began to feel dizzy, she sat down and lost consciousness.  Notes thought this could have been related to recent increase in her HCTZ to 25 mg.  CTA head showed no acute intracranial abnormality with mild cerebral  atrophy and microvascular disease as well as mild right frontal scalp soft tissue swelling.  She was last seen by Deanna Listen, PA on October 2024 where she denied any further syncopal episodes.  It was noted she reported memory issues with cognitive decline over the past 5 to 6 years.  Carotid artery ultrasound was ordered and completed 08/14/2023 showing no evidence of stenosis in right ICA and left ICA consistent with 1-39% stenosis.  Echocardiogram completed 08/14/2023 showing LVEF 60-60%, no RWMA, grade 1 DD, RV SF normal, left atrial size mildly dilated, mild mitral valve regurgitation.  Zio patch completed on 07/11/2023 showing an average heart rate of 71 bpm (range 52 to 121 bpm), predominant rhythm was sinus rhythm.  18 atrial runs noted with the fastest being 5 beats and longest 17 beats, no sustained arrhythmias, significant bradycardia, sinus pauses, high-grade AV block.  Was advised the patient take her HCTZ 12.5 mg given concern for orthostasis/dehydration.      History of Present Illness:  Deanna Ross is a 72 y.o. female who returns for 16-month follow-up for syncope.  Today patient is doing well overall.  She denies any episodes of near syncope, syncope, falls, or loss of consciousness since event in September 2024.  She notes that she is back to her baseline.  She states during her previous event, she was going from a sitting to standing position with multiple positional changes to singing in a choir.  She has focused on staying hydrated with  at least 64 ounces of water daily as well as electrolyte drinks like Gatorade.  She does note that sometimes she will get a little lightheaded on positional changes such as bending over to feed her cat.  She is interesting in returning back to her water aerobics.  She has had no exertional angina or dyspnea.  She denies chest pain, shortness of breath, DOE, palpitations, N/V, loss of bowel/bladder function, hematuria, orthopnea, PND.       Review of  Systems  Constitutional: Negative for weight gain and weight loss.  Cardiovascular:  Negative for chest pain, claudication, cyanosis, dyspnea on exertion, irregular heartbeat, leg swelling, near-syncope, orthopnea, palpitations, paroxysmal nocturnal dyspnea and syncope.  Respiratory:  Negative for cough, hemoptysis and shortness of breath.   Gastrointestinal:  Negative for abdominal pain, hematochezia and melena.  Genitourinary:  Negative for hematuria.  Neurological:  Positive for dizziness and light-headedness. Negative for headaches.     See HPI    Studies Reviewed:       ZIO 07/11/2023   14-day Zio patch monitor (10/28-11/07/2023)   Predominant underlying rhythm was sinus rhythm with a rate range of 52 to 121 bpm, average 71 bpm.  Symptoms are noted with sinus rhythm.   Isolated PACs and PVCs (premature atrial and ventricular contractions) were rare (<1.0%), with rare couplets and triplets.   18 Atrial Runs noted: Fastest was 5 beats (1.9 seconds) rate range 122 to 171 bpm and an average of 138 bpm.  Longest was 17 beats (8.9 seconds) with a rate range of 97-136 bpm and an average of 121 bpm.  Neither of these were noted on patient trigger.   No Sustained Arrhythmias: Atrial Tachycardia (AT), Supraventricular Tachycardia (SVT), Atrial Fibrillation (A-Fib), Atrial Flutter (A-Flutter), Sustained Ventricular Tachycardia (VT)   No significant bradycardia, sinus pauses or high-grade AV block.  Echocardiogram 08/14/2023  1. Left ventricular ejection fraction, by estimation, is 60 to 65%. The  left ventricle has normal function. The left ventricle has no regional  wall motion abnormalities. Left ventricular diastolic parameters are  consistent with Grade I diastolic  dysfunction (impaired relaxation).   2. Right ventricular systolic function is normal. The right ventricular  size is normal. Tricuspid regurgitation signal is inadequate for assessing  PA pressure.   3. Left atrial size was  mildly dilated.   4. The mitral valve is normal in structure. Mild mitral valve  regurgitation. No evidence of mitral stenosis.   5. The aortic valve has an indeterminant number of cusps. Aortic valve  regurgitation is not visualized. No aortic stenosis is present.   6. The inferior vena cava is normal in size with greater than 50%  respiratory variability, suggesting right atrial pressure of 3 mmHg.   Carotid ultrasound 08/14/2023 Right Carotid: There is no evidence of stenosis in the right ICA.  Left Carotid: Velocities in the left ICA are consistent with a 1-39%  stenosis.  Risk Assessment/Calculations:             Physical Exam:   VS:  BP 120/72 (BP Location: Left Arm, Patient Position: Sitting)   Pulse 74   Ht 5' 4.7" (1.643 m)   Wt 143 lb 12.8 oz (65.2 kg)   SpO2 98%   BMI 24.15 kg/m    Wt Readings from Last 3 Encounters:  09/09/23 143 lb 12.8 oz (65.2 kg)  09/03/23 146 lb 3.2 oz (66.3 kg)  07/22/23 140 lb (63.5 kg)    Constitutional:      Appearance: Normal and  healthy appearance.  HENT:     Head: Normocephalic.  Neck:     Vascular: JVD normal.  Pulmonary:     Effort: Pulmonary effort is normal.     Breath sounds: Normal breath sounds.  Chest:     Chest wall: Not tender to palpatation.  Cardiovascular:     PMI at left midclavicular line. Normal rate. Regular rhythm. Normal S1. Normal S2.      Murmurs: There is no murmur.     No gallop.  No click. No rub.  Pulses:    Intact distal pulses.  Edema:    Peripheral edema absent.  Musculoskeletal: Normal range of motion.     Cervical back: Normal range of motion and neck supple. Skin:    General: Skin is warm and dry.  Neurological:     General: No focal deficit present.     Mental Status: Alert, oriented to person, place, and time and oriented to person, place and time.  Psychiatric:        Attention and Perception: Attention and perception normal.        Mood and Affect: Mood normal.        Behavior:  Behavior is cooperative.        Thought Content: Thought content normal.        Assessment and Plan:  Syncope -She has had no recurrent near-syncope/syncope since event 05/2023.  She does note mild lightheadedness going from sitting to standing position or bending over.  She has had no exertional angina.  She has had symptomatic improvement following dose reduction of HCTZ from 25 mg to 12.5 mg. Her recent coronary CTA showed calcium score 0 therefore make ischemic heart disease unlikely etiology.  Her recent echocardiogram was reassuring with normal LVEF, no regional wall abnormalities, and mild mitral valve regurgitation.  Her recent Zio patch was reassuring and showed no significant bradycardia, sinus pauses, high-grade AV block.  Her carotid ultrasound showed only left ICA stenosis with 1-39%.  Given her recent reassuring examinations and symptomatic improvement following dose reduction of HCTZ her syncope likely due to orthostatic hypotension with associated dehydration.  She was educated to increase fluid intake to 64 ounces, include drinks such as Gatorade that her hide electrolytes, change positions slowly, encourage compression stockings.  Hypertension -Her blood pressure today is 120/72 and under good control.  With concern for orthostasis contributing to her syncope okay for now to allow for some degree of permissive hypertension.  Continue hydrochlorothiazide 12.5 mg once daily, lisinopril 40 mg once daily.   Hyperlipidemia -LDL cholesterol 63 on 03/2023.  Coronary CTA on 07/2022 with calcium score 0.  She does have history of carotid artery disease given recent ultrasound.  Her goal will be set to less than 70.  Continue atorvastatin 20 mg once daily.  Encouraged heart healthy dieting and diets high in fiber, fruits, vegetables.  Cognitive decline -There is some concern given history of cognitive decline/memory loss with this patient over the last 5 to 6 years.  She is going to talk with  her PCP regarding referral to neurology.              Dispo:  Return in about 3 months (around 12/08/2023).  Signed, Denyce Robert, NP

## 2023-09-15 ENCOUNTER — Ambulatory Visit: Payer: Medicare Other

## 2023-09-15 VITALS — BP 126/76

## 2023-09-15 DIAGNOSIS — I10 Essential (primary) hypertension: Secondary | ICD-10-CM | POA: Diagnosis not present

## 2023-09-15 NOTE — Progress Notes (Signed)
Pt presented for BP check. Pt was identified through two identifiers. After going over med list pt confirmed she takes: hydrochlorothiazide (HYDRODIURIL) 12.5 MG tablet  & lisinopril (ZESTRIL) 40 MG    Pt denies: chest pain, SOB, headache, nausea, blurred vision, numbness or tingling, and dizziness  BP after 5 minutes of rest: BP: 126/76 P: 75   Pt brought in two BP cuffs to calibrate.   Her first one had a reading of 147/71 which was 21 point difference    Her second cuff read 151/79 which was at a 25 point difference.    I advised pt of a brand that Dr. Darrick Huntsman recommends to all her patients the [facility] brand and where she could find one at a [facility] or [facility].

## 2023-10-03 ENCOUNTER — Ambulatory Visit: Payer: Medicare Other | Admitting: Family Medicine

## 2023-11-07 ENCOUNTER — Other Ambulatory Visit: Payer: Self-pay | Admitting: Family Medicine

## 2023-11-07 DIAGNOSIS — K219 Gastro-esophageal reflux disease without esophagitis: Secondary | ICD-10-CM

## 2023-11-09 ENCOUNTER — Other Ambulatory Visit: Payer: Self-pay | Admitting: Family Medicine

## 2023-11-09 DIAGNOSIS — I10 Essential (primary) hypertension: Secondary | ICD-10-CM

## 2023-11-17 ENCOUNTER — Other Ambulatory Visit: Payer: Self-pay | Admitting: Family Medicine

## 2023-11-17 DIAGNOSIS — K219 Gastro-esophageal reflux disease without esophagitis: Secondary | ICD-10-CM

## 2023-11-17 NOTE — Telephone Encounter (Signed)
 Filled 11/10/23, receipt confirmed by pharmacy, no further action needed.

## 2023-11-17 NOTE — Telephone Encounter (Signed)
 Copied from CRM [id number]. Topic: Clinical - Medication Refill >> Nov 17, 2023  9:30 AM Kathryne Eriksson wrote: Most Recent Primary Care Visit:  Provider: Donavan Foil  Department: [facility]  Visit Type: NURSE VISIT  Date: 09/15/2023  Medication: esomeprazole (NEXIUM) 20 MG capsule  Has the patient contacted their pharmacy? Yes (Agent: If no, request that the patient contact the pharmacy for the refill. If patient does not wish to contact the pharmacy document the reason why and proceed with request.) (Agent: If yes, when and what did the pharmacy advise?)  Is this the correct pharmacy for this prescription? Yes If no, delete pharmacy and type the correct one.  This is the patient's preferred pharmacy:  [facility] - [place], [place] - [address] [facility] [address] [place] [address] [place] [address] Phone: [phone] Fax: [phone]    Has the prescription been filled recently? No  Is the patient out of the medication? No  Has the patient been seen for an appointment in the last year OR does the patient have an upcoming appointment? Yes  Can we respond through MyChart? Yes  Agent: Please be advised that Rx refills may take up to 3 business days. We ask that you follow-up with your pharmacy.

## 2023-12-07 NOTE — Progress Notes (Unsigned)
 Cardiology Office Note    Date:  12/09/2023   ID:  Deanna Ross, DOB 1950/12/16, MRN [id number]  PCP:  Glori Luis, MD (Inactive)  Cardiologist:  Bryan Lemma, MD  Electrophysiologist:  None   Chief Complaint: Follow-up  History of Present Illness:   Deanna Ross is a 73 y.o. female with history of recurrent presyncope and syncope, HTN, and HLD who presents for follow-up of history of syncope.  She was evaluated by Dr. Herbie Baltimore in 06/2022 for evaluation of near syncope following diarrhea illness, lack of sleep, and diminished oral intake that occurred in 04/2022.  Zio patch in 06/2022, worn for 3 days, showed a predominant rhythm of sinus with an average rate of 68 bpm (range 50 to 101 bpm), 2 atrial runs lasting up to 2.6 seconds noted, and rare supraventricular and ventricular ectopy.  Coronary CTA in 07/2022 showed a calcium score of 0 with no evidence of CAD.  Noncardiac overread notable for 3 mm pulmonary nodules in the right upper and left lower lobes.  Carotid artery ultrasound from 08/2022 showed 1 to 49% bilateral ICA stenosis with antegrade flow of the bilateral vertebral arteries.  Near syncopal episode was felt to be related to dehydration and fatigue.  Felt to be less likely arrhythmia in etiology.   She was seen by her PCP in 08/2022 following an episode of syncope at [facility], feeling lightheaded while walking back to the table.  In this setting, she was seen in our office in 09/2022 and was without further symptoms, following reduction of hydrochlorothiazide, with recommendation to maintain adequate hydration for management of orthostatic dizziness/near syncope.   She was seen in the ED on 05/14/2023 with reports of elevated blood pressure greater than 200 mmHg systolic.  She had been evaluated at an urgent care the day prior and was advised to take an extra lisinopril.  Blood pressure remained elevated, therefore she presented to the ED.  Blood pressure 182/88.   She had already taken her PTA antihypertensives prior to ED arrival.  EKG unavailable for review.  CT head showed no acute intracranial abnormality.  She was given clonidine 0.1 mg.  Recheck blood pressure 130/71.  She was advised to increase lisinopril from 10 mg to 20 mg daily and to decrease to 10 mg if her systolic reading dropped below 120 mmHg.   She was evaluated in the ED on 06/03/2023 following a syncopal episode.  That evening, while at church, after standing up to sing in the choir, she began to feel dizzy with associated flushing.  She sat down to faint herself and "the next thing I know I am out in the hallway."  No loss of bowel or bladder function.  Reported a friend noted seizure-like activity without significant postictal state noted.  She reported generalized headache thereafter and reported she had headaches following prior episodes of syncope.  BP ranged from 121-140 mmHg systolic.  High-sensitivity troponin negative x 2.  Chest x-ray without acute cardiopulmonary process.  CTA head showed no acute intracranial abnormality with mild cerebral atrophy and microvascular disease as well as mild right frontal scalp soft tissue swelling.  EKG showed sinus rhythm with no acute ST-T changes.  She remained stable in the ED and declined admission.  She was evaluated in the office in 06/2023 and was without further syncopal episodes.  She was without symptoms of angina or cardiac decompensation.  She reported she had been taking HCTZ 25 mg daily rather than the previously  recommended 12.5 mg.  It was recommended she consider reducing HCTZ down to 12.5 mg.  Zio patch in 06/2023 showed a predominant rhythm of sinus with an average rate of 71 bpm (range 52 to 121 bpm), isolated PACs and PVCs were rare with an overall burden of less than 1%.  18 atrial runs occurred with the fastest interval lasting 1.9 seconds with an average rate of 138 bpm and the longest interval lasting 17 beats (8.9 seconds) with an  average rate of 121 bpm.  Neither of these episodes were noted on patient triggered events.  No sustained arrhythmias, prolonged pauses, significant bradycardia, or high-grade AV block.   Carotid artery ultrasound on 08/14/2023 showed no evidence of stenosis in the right ICA with 1 to 39% left ICA stenosis, antegrade flow of the bilateral vertebral arteries, and normal flow hemodynamics of the bilateral subclavian arteries.  Echo in 07/2023 showed an EF of 60 to 65%, no regional wall motion abnormalities, grade 1 diastolic dysfunction, normal RV systolic function and ventricular cavity size, mildly dilated left atrium, mild mitral regurgitation, and an estimated right atrial pressure of 3 mmHg.  She followed up in the office in 08/2023 and noted symptomatic improvement following reduction of HCTZ to 12.5 mg daily.  Episode was felt to be due to orthostatic hypotension with associated dehydration.  No further testing was pursued at that time.  She comes in doing very well from a cardiac perspective and is without symptoms of angina or cardiac decompensation.  No dizziness, presyncope, or syncope.  No palpitations, lower swelling, or progressive orthopnea.  No falls or symptoms concerning for bleeding.  Reports blood pressure at home is largely in the 140s systolic.  Does not have any acute cardiac concerns at this time.   Labs independently reviewed: 06/2023 - potassium 3.9, BUN 11, serum creatinine 0.69 05/2023 - magnesium 1.8, Hgb 14.0, PLT 234 04/2023 - TSH normal, albumin 4.0, AST/ALT normal 03/2023 - A1c 5.6, TC 150, TG 159, HDL 54, LDL 63  Past Medical History:  Diagnosis Date   Cancer (HCC)    Has BRCA 2 gene   Heart murmur    High cholesterol    Hypertension    Skin cyst 03/28/2018    Past Surgical History:  Procedure Laterality Date   ABDOMINAL HYSTERECTOMY  2008   BREAST CYST ASPIRATION Left    negative   BREAST EXCISIONAL BIOPSY Left    negative x2 done at same time    TONSILLECTOMY AND ADENOIDECTOMY     TUBAL LIGATION      Current Medications: Current Meds  Medication Sig   atorvastatin (LIPITOR) 20 MG tablet TAKE 1 TABLET BY MOUTH ONCE  DAILY   esomeprazole (NEXIUM) 20 MG capsule TAKE 1 CAPSULE BY MOUTH DAILY   hydrochlorothiazide (HYDRODIURIL) 12.5 MG tablet TAKE 1 TABLET BY MOUTH DAILY   lisinopril (ZESTRIL) 40 MG tablet Take 1 tablet (40 mg total) by mouth daily.   Multiple Vitamins-Minerals (MULTIVITAMIN ADULT PO) Take by mouth.   RESTASIS 0.05 % ophthalmic emulsion 1 drop 2 (two) times daily.    Allergies:   Shellfish-derived products, Gadolinium derivatives, Iodine, and Tdap [tetanus-diphth-acell pertussis]   Social History   Socioeconomic History   Marital status: Married    Spouse name: Not on file   Number of children: Not on file   Years of education: Not on file   Highest education level: 12th grade  Occupational History   Not on file  Tobacco Use   Smoking status:  Never   Smokeless tobacco: Never  Vaping Use   Vaping status: Never Used  Substance and Sexual Activity   Alcohol use: No    Alcohol/week: 0.0 standard drinks of alcohol   Drug use: No   Sexual activity: Never    Partners: Male  Other Topics Concern   Not on file  Social History Narrative   Retired 4 years ago from [employer] with husband   3 daughters (48, 16, 79)    7 Grandchildren and 1 great grandchild    Enjoys swimming   2 cats and 1 dog- live inside the house    Social Drivers of Corporate investment banker Strain: Low Risk  (08/30/2023)   Overall Financial Resource Strain (CARDIA)    Difficulty of Paying Living Expenses: Not hard at all  Food Insecurity: No Food Insecurity (08/30/2023)   Hunger Vital Sign    Worried About Running Out of Food in the Last Year: Never true    Ran Out of Food in the Last Year: Never true  Transportation Needs: No Transportation Needs (08/30/2023)   PRAPARE - Administrator, Civil Service (Medical):  No    Lack of Transportation (Non-Medical): No  Physical Activity: Sufficiently Active (08/30/2023)   Exercise Vital Sign    Days of Exercise per Week: 7 days    Minutes of Exercise per Session: 30 min  Recent Concern: Physical Activity - Insufficiently Active (07/15/2023)   Exercise Vital Sign    Days of Exercise per Week: 3 days    Minutes of Exercise per Session: 30 min  Stress: No Stress Concern Present (08/30/2023)   [employer] of Occupational Health - Occupational Stress Questionnaire    Feeling of Stress : Not at all  Social Connections: Socially Integrated (08/30/2023)   Social Connection and Isolation Panel [NHANES]    Frequency of Communication with Friends and Family: More than three times a week    Frequency of Social Gatherings with Friends and Family: More than three times a week    Attends Religious Services: More than 4 times per year    Active Member of [employer] or Organizations: Yes    Attends Engineer, structural: More than 4 times per year    Marital Status: Married     Family History:  The patient's family history includes BRCA 1/2 in an other family member; Breast cancer (age of onset: 37) in her daughter; Breast cancer (age of onset: 86) in her paternal aunt; Breast cancer (age of onset: 38) in her maternal grandmother; Breast cancer (age of onset: 39) in her sister; Cancer in her daughter and paternal uncle; Hyperlipidemia in her mother; Hypertension in her mother.  ROS:   12-point review of systems is negative unless otherwise noted in the HPI.   EKGs/Labs/Other Studies Reviewed:    Studies reviewed were summarized above. The additional studies were reviewed today:  2D echo 08/14/2023: 1. Left ventricular ejection fraction, by estimation, is 60 to 65%. The  left ventricle has normal function. The left ventricle has no regional  wall motion abnormalities. Left ventricular diastolic parameters are  consistent with Grade I diastolic  dysfunction  (impaired relaxation).   2. Right ventricular systolic function is normal. The right ventricular  size is normal. Tricuspid regurgitation signal is inadequate for assessing  PA pressure.   3. Left atrial size was mildly dilated.   4. The mitral valve is normal in structure. Mild mitral valve  regurgitation. No  evidence of mitral stenosis.   5. The aortic valve has an indeterminant number of cusps. Aortic valve  regurgitation is not visualized. No aortic stenosis is present.   6. The inferior vena cava is normal in size with greater than 50%  respiratory variability, suggesting right atrial pressure of 3 mmHg.  __________  Carotid artery ultrasound 08/14/2023: Summary:  Right Carotid: There is no evidence of stenosis in the right ICA.   Left Carotid: Velocities in the left ICA are consistent with a 1-39% stenosis.   Vertebrals: Bilateral vertebral arteries demonstrate antegrade flow.  Subclavians: Normal flow hemodynamics were seen in bilateral subclavian arteries.  __________  Luci [place] patch 06/2023:   Predominant underlying rhythm was sinus rhythm with a rate range of 52 to 121 bpm, average 71 bpm.  Symptoms are noted with sinus rhythm.   Isolated PACs and PVCs (premature atrial and ventricular contractions) were rare (<1.0%), with rare couplets and triplets.   18 Atrial Runs noted: Fastest was 5 beats (1.9 seconds) rate range 122 to 171 bpm and an average of 138 bpm.  Longest was 17 beats (8.9 seconds) with a rate range of 97-136 bpm and an average of 121 bpm.  Neither of these were noted on patient trigger.   No Sustained Arrhythmias: Atrial Tachycardia (AT), Supraventricular Tachycardia (SVT), Atrial Fibrillation (A-Fib), Atrial Flutter (A-Flutter), Sustained Ventricular Tachycardia (VT)   No significant bradycardia, sinus pauses or high-grade AV block.       Overall totally benign study.  Very short atrial runs are benign.  Noted notable findings.   Recommend  reassurance. __________  Carotid artery ultrasound 09/12/2022: IMPRESSION: 1. Mild (1-49%) stenosis proximal right internal carotid artery secondary to mild smooth heterogeneous atherosclerotic plaque. 2. Mild (1-49%) stenosis proximal left internal carotid artery secondary to mild smooth heterogeneous atherosclerotic plaque. 3. Vertebral arteries are patent with normal antegrade flow. __________   Coronary CTA 08/12/2022: FINDINGS: Aorta: Normal size. Mild ascending and descending aorta calcifications. No dissection.   Aortic Valve:  Trileaflet.  No calcifications.   Coronary Arteries:  Normal coronary origin.  Right dominance.   RCA is a dominant artery that gives rise to PDA and PLA. There is no plaque.   Left main gives rise to LAD and LCX arteries.   LAD is a large vessel that has no plaque.   LCX is a non-dominant artery that gives rise to three obtuse marginal branches. There is no plaque.   Other findings:   Normal pulmonary vein drainage into the left atrium.   Normal left atrial appendage without a thrombus.   Normal size of the pulmonary artery.   IMPRESSION: 1. Normal coronary calcium score of 0.  Patient is low risk. 2. Normal coronary origin with right dominance. 3. No evidence of CAD. 4. CAD-RADS 0. Consider non-atherosclerotic causes of chest pain.     Noncardiac overread: IMPRESSION: 3 mm pulmonary nodules in the right upper and left lower lobes. No follow-up needed if patient is low-risk (and has no known or suspected primary neoplasm). Non-contrast chest CT can be considered in 12 months if patient is high-risk. __________   Zio patch 06/2022:   Predominant underlying Rhythm: Sinus. HR range 50-101 bpm, avg 68   Rare (<1%) PACs with some couplets & triplets   Rare PACs with no couplets, triplets, bigeminy or trigeminy.   2 Atrial Runs of 5 beats.: Fastest HR Range 150-167 bpm, Avg 161 bpm, 1.9 sec; LongestHR Range 113-128 bpm, Avg 123 bpm,  2.6 sec  No Sustained Arrhythmias: Atrial Tachycardia (AT), Supraventricular Tachycardia (SVT), Atrial Fibrillation (A-Fib), Atrial Flutter (A-Flutter), Sustained Ventricular Tachycardia (VT)   Symptoms of heart fluttering/racing or chest pain noted with sinus rhythm heart rate ranging from 60 to 90 bpm.  No associated PACs or PVCs.   Overall pretty benign study.  No abnormal findings. Relatively rare PACs and PVCs with 2 short little atrial runs would not cause dizziness or giddiness or weakness.  No syncope or near syncope associated with these findings.   Symptoms that were noted were with normal rhythm.   EKG:  EKG is ordered today.  The EKG ordered today demonstrates NSR, 64 bpm, incomplete RBBB, no acute st/t changes  Recent Labs: 05/14/2023: ALT 20 05/16/2023: TSH 0.47 06/03/2023: Hemoglobin 14.0; Magnesium 1.8; Platelets 234 07/14/2023: BUN 11; Creatinine, Ser 0.69; Potassium 3.9; Sodium 136  Recent Lipid Panel    Component Value Date/Time   CHOL 150 04/02/2023 0857   TRIG 159.0 (H) 04/02/2023 0857   HDL 54.50 04/02/2023 0857   CHOLHDL 3 04/02/2023 0857   VLDL 31.8 04/02/2023 0857   LDLCALC 63 04/02/2023 0857   LDLDIRECT 75.0 08/24/2018 1020    PHYSICAL EXAM:    VS:  BP (!) 142/80 (BP Location: Right Arm)   Pulse 64   Ht 5\' 5"  (1.651 m)   Wt 151 lb 3.2 oz (68.6 kg)   SpO2 98%   BMI 25.16 kg/m   BMI: Body mass index is 25.16 kg/m.  Physical Exam Vitals reviewed.  Constitutional:      Appearance: She is well-developed.  HENT:     Head: Normocephalic and atraumatic.  Eyes:     General:        Right eye: No discharge.        Left eye: No discharge.  Cardiovascular:     Rate and Rhythm: Normal rate and regular rhythm.     Heart sounds: Normal heart sounds, S1 normal and S2 normal. Heart sounds not distant. No midsystolic click and no opening snap. No murmur heard.    No friction rub.  Pulmonary:     Effort: Pulmonary effort is normal. No respiratory distress.      Breath sounds: Normal breath sounds. No decreased breath sounds, wheezing, rhonchi or rales.  Chest:     Chest wall: No tenderness.  Musculoskeletal:     Cervical back: Normal range of motion.     Right lower leg: No edema.     Left lower leg: No edema.  Skin:    General: Skin is warm and dry.     Nails: There is no clubbing.  Neurological:     Mental Status: She is alert and oriented to person, place, and time.  Psychiatric:        Speech: Speech normal.        Behavior: Behavior normal.        Thought Content: Thought content normal.        Judgment: Judgment normal.     Wt Readings from Last 3 Encounters:  12/09/23 151 lb 3.2 oz (68.6 kg)  09/09/23 143 lb 12.8 oz (65.2 kg)  09/03/23 146 lb 3.2 oz (66.3 kg)     ASSESSMENT & PLAN:   History of recurrent presyncope and syncope: No further episodes, last episode occurring 06/03/2023.  Felt to be in the setting of dehydration with associated orthostasis following dosage increase of HCTZ.  Cardiac workup reassuring including coronary CTA, echo, carotid artery ultrasound, and Zio patch.  At this time,  there does not appear to be an indication for EP evaluation for consideration of ILR.  However, should she have recurrence of symptoms would consider this moving forward.  Would not escalate HCTZ to 25 mg of moving forward.  No further cardiac testing indicated at this time.  HTN: Blood pressure is mildly elevated in the office and at home.  Suspect she will need some degree of permissive hypertension.  She will monitor blood pressure over the next several months and bring these readings to her next appointment for review.  For now, she remains on lisinopril 40 mg and HCTZ 12.5 mg.  HLD: LDL 63 in 03/2023 with normal AST/ALT in 04/2023.  Remains on atorvastatin 20 mg.    Disposition: F/u with Dr. Herbie Baltimore or an APP in 3 months.   Medication Adjustments/Labs and Tests Ordered: Current medicines are reviewed at length with the patient  today.  Concerns regarding medicines are outlined above. Medication changes, Labs and Tests ordered today are summarized above and listed in the Patient Instructions accessible in Encounters.   Signed, [name], PA-C 12/09/2023 10:21 AM     [facility] [place] [address] [place], [place] [address] [phone]

## 2023-12-09 ENCOUNTER — Encounter: Payer: Self-pay | Admitting: Physician Assistant

## 2023-12-09 ENCOUNTER — Ambulatory Visit: Payer: Medicare Other | Attending: Physician Assistant | Admitting: Physician Assistant

## 2023-12-09 VITALS — BP 142/80 | HR 64 | Ht 65.0 in | Wt 151.2 lb

## 2023-12-09 DIAGNOSIS — I10 Essential (primary) hypertension: Secondary | ICD-10-CM | POA: Diagnosis not present

## 2023-12-09 DIAGNOSIS — R55 Syncope and collapse: Secondary | ICD-10-CM | POA: Diagnosis not present

## 2023-12-09 DIAGNOSIS — E785 Hyperlipidemia, unspecified: Secondary | ICD-10-CM | POA: Diagnosis not present

## 2023-12-09 NOTE — Patient Instructions (Signed)
 Medication Instructions:  Your physician recommends that you continue on your current medications as directed. Please refer to the Current Medication list given to you today.  *If you need a refill on your cardiac medications before your next appointment, please call your pharmacy*   Lab Work: No labs ordered today  If you have labs (blood work) drawn today and your tests are completely normal, you will receive your results only by: MyChart Message (if you have MyChart) OR A paper copy in the mail If you have any lab test that is abnormal or we need to change your treatment, we will call you to review the results.   Testing/Procedures: No test ordered today    Follow-Up: At [facility], you and your health needs are our priority.  As part of our continuing mission to provide you with exceptional heart care, we have created designated Provider Care Teams.  These Care Teams include your primary Cardiologist (physician) and Advanced Practice Providers (APPs -  Physician Assistants and Nurse Practitioners) who all work together to provide you with the care you need, when you need it.    Your next appointment:   3 month(s)  Provider:   You will see one of the following Advanced Practice Providers on your designated Care Team:   [name], NP [name], PA-C [name], PA-C [name], NP [name], NP      Other Instructions   Monitor BP

## 2023-12-16 ENCOUNTER — Telehealth: Payer: Self-pay | Admitting: Family Medicine

## 2023-12-16 NOTE — Telephone Encounter (Signed)
 Dr Clent Ridges is leaving the practice and your New Patient or Transfer of Care appointment needs to be rescheduled with another provider. Please call the office to schedule a Transfer of Care to either Dr Charlann Lange, Darleen Crocker or Kara Dies, NP.   Thank you  E2C2, please reschedule this patient's TOC visit. [facility]

## 2023-12-20 DIAGNOSIS — A084 Viral intestinal infection, unspecified: Secondary | ICD-10-CM | POA: Diagnosis not present

## 2023-12-20 DIAGNOSIS — R519 Headache, unspecified: Secondary | ICD-10-CM | POA: Diagnosis not present

## 2023-12-20 DIAGNOSIS — Z20822 Contact with and (suspected) exposure to covid-19: Secondary | ICD-10-CM | POA: Diagnosis not present

## 2024-02-23 ENCOUNTER — Telehealth: Payer: Self-pay

## 2024-02-23 NOTE — Telephone Encounter (Signed)
 We received a fax for patient regarding medication and I noticed that she does not have a TOC appointment scheduled with us .  I spoke with patient to see if she would like to schedule a TOC appointment with us  or if she is planning to receive primary care elsewhere.  Patient states she is going to go somewhere closer to home.  Patient states sometimes she just "falls out" and [place]

## 2024-03-05 ENCOUNTER — Encounter: Payer: Medicare Other | Admitting: Family Medicine

## 2024-03-12 ENCOUNTER — Ambulatory Visit: Admitting: Physician Assistant

## 2024-03-15 ENCOUNTER — Ambulatory Visit: Attending: Medical | Admitting: Medical

## 2024-03-15 ENCOUNTER — Encounter: Payer: Self-pay | Admitting: Medical

## 2024-03-15 VITALS — BP 132/72 | HR 77 | Ht 65.0 in | Wt 148.0 lb

## 2024-03-15 DIAGNOSIS — I10 Essential (primary) hypertension: Secondary | ICD-10-CM | POA: Diagnosis not present

## 2024-03-15 DIAGNOSIS — E782 Mixed hyperlipidemia: Secondary | ICD-10-CM | POA: Diagnosis not present

## 2024-03-15 DIAGNOSIS — R55 Syncope and collapse: Secondary | ICD-10-CM | POA: Diagnosis not present

## 2024-03-15 NOTE — Patient Instructions (Signed)
 Medication Instructions:  Your physician recommends that you continue on your current medications as directed. Please refer to the Current Medication list given to you today.    *If you need a refill on your cardiac medications before your next appointment, please call your pharmacy*  Lab Work: No labs ordered today    Testing/Procedures: No test ordered today   Follow-Up: At [facility], you and your health needs are our priority.  As part of our continuing mission to provide you with exceptional heart care, our providers are all part of one team.  This team includes your primary Cardiologist (physician) and Advanced Practice Providers or APPs (Physician Assistants and Nurse Practitioners) who all work together to provide you with the care you need, when you need it.  Your next appointment:   1 year(s)  Provider:   You may see Alm Clay, MD or one of the following Advanced Practice Providers on your designated Care Team:   Lonni Meager, NP Lesley Maffucci, PA-C Bernardino Bring, PA-C Cadence Cameron, PA-C Tylene Lunch, NP Barnie Hila, NP

## 2024-03-15 NOTE — Progress Notes (Signed)
 Cardiology Office Note   Date:  03/15/2024  ID:  Deanna Ross, DOB 04/12/1951, MRN [id number] PCP: Freddrick Johns  [facility] Providers Cardiologist:  Alm Clay, MD   History of Present Illness Deanna Ross is a 73 y.o. female with a history of recurrent presyncope and syncope, hypertension, and hyperlipidemia presents for follow-up of syncope.  She is evaluated by Dr. Clay 09/12/2019 for near syncope following a GI illness, lack of sleep, dehydration.  Heart monitor showed predominantly normal sinus rhythm with an average heart rate of 60 bpm.  2 atrial runs lasting 2.6 seconds, rare NSVT and ventricular ectopy.  Coronary CTA showed a coronary calcium  score of 0.  Carotid artery ultrasound showed 1 to 49% bilateral ICA stenosis with antegrade flow in bilateral vertebral arteries.  Was felt significant due to dehydration and fatigue.  Patient was seen in the ER 2024 for elevated blood pressure and syncope.  Repeat heart monitor 06/2023 showed predominantly normal sinus rhythm with an average heart rate of 71 bpm, isolated PAC, PVCs with rare overall burden of less than 1%, 18 atrial runs with the fastest being 1.9 seconds.  No sustained arrhythmias, pauses, significant bradycardia or high grade AV block.  Repeat carotid ultrasound 08/14/2023 showed no evidence of stenosis in the right ICA with 1 to 39% left ICA stenosis, antegrade flow of the bilateral vertebral arteries, normal flow hemodynamics in the bilateral subclavian arteries.  Echo 10/03/2022 showed EF of 60 to 65%, no wall motion abnormalities, grade 1 diastolic dysfunction, no significant valvular disease.  Patient was last seen 12/09/2023 and was overall doing well from a cardiac perspective.  Today, the patient reports no further syncope or dizziness. Last episode was October 2024. She denies lightheadedness. She does water aerobics 5-7 times weekly. Patient tries to walk more. She denies chest pain or shortness of  breath.    Studies Reviewed     2D echo 08/14/2023: 1. Left ventricular ejection fraction, by estimation, is 60 to 65%. The  left ventricle has normal function. The left ventricle has no regional  wall motion abnormalities. Left ventricular diastolic parameters are  consistent with Grade I diastolic  dysfunction (impaired relaxation).   2. Right ventricular systolic function is normal. The right ventricular  size is normal. Tricuspid regurgitation signal is inadequate for assessing  PA pressure.   3. Left atrial size was mildly dilated.   4. The mitral valve is normal in structure. Mild mitral valve  regurgitation. No evidence of mitral stenosis.   5. The aortic valve has an indeterminant number of cusps. Aortic valve  regurgitation is not visualized. No aortic stenosis is present.   6. The inferior vena cava is normal in size with greater than 50%  respiratory variability, suggesting right atrial pressure of 3 mmHg.  __________   Carotid artery ultrasound 08/14/2023: Summary:  Right Carotid: There is no evidence of stenosis in the right ICA.   Left Carotid: Velocities in the left ICA are consistent with a 1-39% stenosis.   Vertebrals: Bilateral vertebral arteries demonstrate antegrade flow.  Subclavians: Normal flow hemodynamics were seen in bilateral subclavian arteries.  __________   Zio patch 06/2023:   Predominant underlying rhythm was sinus rhythm with a rate range of 52 to 121 bpm, average 71 bpm.  Symptoms are noted with sinus rhythm.   Isolated PACs and PVCs (premature atrial and ventricular contractions) were rare (<1.0%), with rare couplets and triplets.   18 Atrial Runs noted: Fastest was 5 beats (1.9  seconds) rate range 122 to 171 bpm and an average of 138 bpm.  Longest was 17 beats (8.9 seconds) with a rate range of 97-136 bpm and an average of 121 bpm.  Neither of these were noted on patient trigger.   No Sustained Arrhythmias: Atrial Tachycardia (AT),  Supraventricular Tachycardia (SVT), Atrial Fibrillation (A-Fib), Atrial Flutter (A-Flutter), Sustained Ventricular Tachycardia (VT)   No significant bradycardia, sinus pauses or high-grade AV block.       Overall totally benign study.  Very short atrial runs are benign.  Noted notable findings.   Recommend reassurance. __________   Carotid artery ultrasound 09/12/2022: IMPRESSION: 1. Mild (1-49%) stenosis proximal right internal carotid artery secondary to mild smooth heterogeneous atherosclerotic plaque. 2. Mild (1-49%) stenosis proximal left internal carotid artery secondary to mild smooth heterogeneous atherosclerotic plaque. 3. Vertebral arteries are patent with normal antegrade flow. __________   Coronary CTA 08/12/2022: FINDINGS: Aorta: Normal size. Mild ascending and descending aorta calcifications. No dissection.   Aortic Valve:  Trileaflet.  No calcifications.   Coronary Arteries:  Normal coronary origin.  Right dominance.   RCA is a dominant artery that gives rise to PDA and PLA. There is no plaque.   Left main gives rise to LAD and LCX arteries.   LAD is a large vessel that has no plaque.   LCX is a non-dominant artery that gives rise to three obtuse marginal branches. There is no plaque.   Other findings:   Normal pulmonary vein drainage into the left atrium.   Normal left atrial appendage without a thrombus.   Normal size of the pulmonary artery.   IMPRESSION: 1. Normal coronary calcium  score of 0.  Patient is low risk. 2. Normal coronary origin with right dominance. 3. No evidence of CAD. 4. CAD-RADS 0. Consider non-atherosclerotic causes of chest pain.     Noncardiac overread: IMPRESSION: 3 mm pulmonary nodules in the right upper and left lower lobes. No follow-up needed if patient is low-risk (and has no known or suspected primary neoplasm). Non-contrast chest CT can be considered in 12 months if patient is high-risk. __________   Zio patch  06/2022:   Predominant underlying Rhythm: Sinus. HR range 50-101 bpm, avg 68   Rare (<1%) PACs with some couplets & triplets   Rare PACs with no couplets, triplets, bigeminy or trigeminy.   2 Atrial Runs of 5 beats.: Fastest HR Range 150-167 bpm, Avg 161 bpm, 1.9 sec; LongestHR Range 113-128 bpm, Avg 123 bpm, 2.6 sec   No Sustained Arrhythmias: Atrial Tachycardia (AT), Supraventricular Tachycardia (SVT), Atrial Fibrillation (A-Fib), Atrial Flutter (A-Flutter), Sustained Ventricular Tachycardia (VT)   Symptoms of heart fluttering/racing or chest pain noted with sinus rhythm heart rate ranging from 60 to 90 bpm.  No associated PACs or PVCs.   Overall pretty benign study.  No abnormal findings. Relatively rare PACs and PVCs with 2 short little atrial runs would not cause dizziness or giddiness or weakness.  No syncope or near syncope associated with these findings.   Symptoms that were noted were with normal rhythm.       Physical Exam VS:  BP 132/72 (BP Location: Left Arm, Patient Position: Sitting, Cuff Size: Normal)   Pulse 77   Ht 5' 5 (1.651 m)   Wt 148 lb (67.1 kg)   SpO2 99%   BMI 24.63 kg/m        Wt Readings from Last 3 Encounters:  03/15/24 148 lb (67.1 kg)  12/09/23 151 lb 3.2 oz (68.6 kg)  09/09/23 143 lb 12.8 oz (65.2 kg)    GEN: Well nourished, well developed in no acute distress NECK: No JVD; No carotid bruits CARDIAC: RRR, no murmurs, rubs, gallops RESPIRATORY:  Clear to auscultation without rales, wheezing or rhonchi  ABDOMEN: Soft, non-tender, non-distended EXTREMITIES:  No edema; No deformity   ASSESSMENT AND PLAN  H/o recurrent presyncope and syncope Patient denies any further syncope. Last episode was 06/2023 and work-up following with Cardiac CTA, carotid US , echo and heart monitor were unremarkable. She is doing water aerobics 5-7 days a week and has no symptoms with this. No further work-up at this time.   HTN BP is good today. Continue  hydrochlorothiazide  12.5mg  daily and lisinopril  40mg  daily.   HLD LDL 63 in 2024. Continue Lipitor 20mg  daily.        Dispo: Follow-up in 1 year  Signed, Tedra Coppernoll VEAR Fishman, PA-C

## 2024-04-01 ENCOUNTER — Encounter: Admitting: Nurse Practitioner

## 2024-04-05 ENCOUNTER — Other Ambulatory Visit: Payer: Self-pay | Admitting: Family Medicine

## 2024-04-05 DIAGNOSIS — R55 Syncope and collapse: Secondary | ICD-10-CM

## 2024-04-13 ENCOUNTER — Other Ambulatory Visit: Payer: Self-pay | Admitting: Neurology

## 2024-04-13 DIAGNOSIS — R55 Syncope and collapse: Secondary | ICD-10-CM

## 2024-04-14 ENCOUNTER — Ambulatory Visit

## 2024-04-15 ENCOUNTER — Ambulatory Visit
Admission: RE | Admit: 2024-04-15 | Discharge: 2024-04-15 | Disposition: A | Discharge status: Home / Self Care | Source: Ambulatory Visit | Attending: Neurology | Admitting: Neurology

## 2024-04-15 DIAGNOSIS — R55 Syncope and collapse: Secondary | ICD-10-CM | POA: Diagnosis present

## 2024-08-12 ENCOUNTER — Telehealth: Payer: Self-pay

## 2024-08-12 NOTE — Telephone Encounter (Signed)
 Error
